# Patient Record
Sex: Male | Born: 1955 | Race: White | Hispanic: No | Marital: Single | Smoking: Current every day smoker
Health system: Southern US, Community
[De-identification: ages and names within clinical notes are randomized; demographics above are authoritative.]

## PROBLEM LIST (undated history)

## (undated) DIAGNOSIS — G2581 Restless legs syndrome: Secondary | ICD-10-CM

## (undated) DIAGNOSIS — F17200 Nicotine dependence, unspecified, uncomplicated: Secondary | ICD-10-CM

## (undated) DIAGNOSIS — C801 Malignant (primary) neoplasm, unspecified: Secondary | ICD-10-CM

## (undated) DIAGNOSIS — G8929 Other chronic pain: Secondary | ICD-10-CM

## (undated) DIAGNOSIS — K5790 Diverticulosis of intestine, part unspecified, without perforation or abscess without bleeding: Secondary | ICD-10-CM

## (undated) DIAGNOSIS — M5430 Sciatica, unspecified side: Secondary | ICD-10-CM

## (undated) DIAGNOSIS — K219 Gastro-esophageal reflux disease without esophagitis: Secondary | ICD-10-CM

## (undated) DIAGNOSIS — T7840XA Allergy, unspecified, initial encounter: Secondary | ICD-10-CM

## (undated) DIAGNOSIS — Z8719 Personal history of other diseases of the digestive system: Secondary | ICD-10-CM

## (undated) DIAGNOSIS — M549 Dorsalgia, unspecified: Secondary | ICD-10-CM

## (undated) DIAGNOSIS — M1990 Unspecified osteoarthritis, unspecified site: Secondary | ICD-10-CM

## (undated) HISTORY — DX: Gastro-esophageal reflux disease without esophagitis: K21.9

## (undated) HISTORY — DX: Personal history of other diseases of the digestive system: Z87.19

## (undated) HISTORY — DX: Allergy, unspecified, initial encounter: T78.40XA

## (undated) HISTORY — DX: Diverticulosis of intestine, part unspecified, without perforation or abscess without bleeding: K57.90

## (undated) HISTORY — PX: TONSILLECTOMY AND ADENOIDECTOMY: SUR1326

## (undated) HISTORY — PX: OTHER SURGICAL HISTORY: SHX169

## (undated) HISTORY — PX: JOINT REPLACEMENT: SHX530

---

## 2011-11-09 ENCOUNTER — Ambulatory Visit: Payer: Self-pay | Admitting: Family Medicine

## 2013-01-18 ENCOUNTER — Emergency Department: Payer: Self-pay | Admitting: Emergency Medicine

## 2013-01-27 ENCOUNTER — Emergency Department: Payer: Self-pay | Admitting: Emergency Medicine

## 2013-08-17 ENCOUNTER — Encounter: Payer: Self-pay | Admitting: Adult Health

## 2013-08-17 ENCOUNTER — Ambulatory Visit (INDEPENDENT_AMBULATORY_CARE_PROVIDER_SITE_OTHER): Payer: BC Managed Care – PPO | Admitting: Adult Health

## 2013-08-17 ENCOUNTER — Telehealth: Payer: Self-pay | Admitting: Emergency Medicine

## 2013-08-17 VITALS — BP 120/74 | HR 81 | Temp 97.7°F | Resp 12 | Ht 72.5 in | Wt 223.0 lb

## 2013-08-17 DIAGNOSIS — Z125 Encounter for screening for malignant neoplasm of prostate: Secondary | ICD-10-CM

## 2013-08-17 DIAGNOSIS — Z1211 Encounter for screening for malignant neoplasm of colon: Secondary | ICD-10-CM

## 2013-08-17 DIAGNOSIS — Z7189 Other specified counseling: Secondary | ICD-10-CM

## 2013-08-17 DIAGNOSIS — Z716 Tobacco abuse counseling: Secondary | ICD-10-CM

## 2013-08-17 DIAGNOSIS — F17200 Nicotine dependence, unspecified, uncomplicated: Secondary | ICD-10-CM

## 2013-08-17 DIAGNOSIS — Z0000 Encounter for general adult medical examination without abnormal findings: Secondary | ICD-10-CM

## 2013-08-17 DIAGNOSIS — K219 Gastro-esophageal reflux disease without esophagitis: Secondary | ICD-10-CM

## 2013-08-17 DIAGNOSIS — T7840XA Allergy, unspecified, initial encounter: Secondary | ICD-10-CM

## 2013-08-17 DIAGNOSIS — R229 Localized swelling, mass and lump, unspecified: Secondary | ICD-10-CM

## 2013-08-17 DIAGNOSIS — G479 Sleep disorder, unspecified: Secondary | ICD-10-CM | POA: Insufficient documentation

## 2013-08-17 DIAGNOSIS — B359 Dermatophytosis, unspecified: Secondary | ICD-10-CM

## 2013-08-17 DIAGNOSIS — D492 Neoplasm of unspecified behavior of bone, soft tissue, and skin: Secondary | ICD-10-CM

## 2013-08-17 LAB — COMPREHENSIVE METABOLIC PANEL
AST: 21 U/L (ref 0–37)
Alkaline Phosphatase: 70 U/L (ref 39–117)
BUN: 20 mg/dL (ref 6–23)
Calcium: 9.9 mg/dL (ref 8.4–10.5)
Chloride: 104 mEq/L (ref 96–112)
Creatinine, Ser: 1.2 mg/dL (ref 0.4–1.5)
Total Bilirubin: 0.9 mg/dL (ref 0.3–1.2)

## 2013-08-17 LAB — LIPID PANEL
Cholesterol: 191 mg/dL (ref 0–200)
HDL: 28.8 mg/dL — ABNORMAL LOW (ref >39.00)
Total CHOL/HDL Ratio: 7
Triglycerides: 409 mg/dL — ABNORMAL HIGH (ref 0.0–149.0)
VLDL: 81.8 mg/dL — ABNORMAL HIGH (ref 0.0–40.0)

## 2013-08-17 LAB — CBC WITH DIFFERENTIAL/PLATELET
Basophils Absolute: 0 10*3/uL (ref 0.0–0.1)
Eosinophils Absolute: 0.3 10*3/uL (ref 0.0–0.7)
Eosinophils Relative: 3.5 % (ref 0.0–5.0)
HCT: 44.3 % (ref 39.0–52.0)
Lymphocytes Relative: 24 % (ref 12.0–46.0)
Lymphs Abs: 1.8 10*3/uL (ref 0.7–4.0)
MCV: 89.1 fl (ref 78.0–100.0)
Monocytes Relative: 8.6 % (ref 3.0–12.0)
Platelets: 177 10*3/uL (ref 150.0–400.0)
RBC: 4.97 Mil/uL (ref 4.22–5.81)
RDW: 12.4 % (ref 11.5–14.6)
WBC: 7.6 10*3/uL (ref 4.5–10.5)

## 2013-08-17 LAB — LDL CHOLESTEROL, DIRECT: Direct LDL: 102.7 mg/dL

## 2013-08-17 LAB — TSH: TSH: 0.95 u[IU]/mL (ref 0.35–5.50)

## 2013-08-17 MED ORDER — TERBINAFINE HCL 1 % EX CREA: TOPICAL_CREAM | Freq: Two times a day (BID) | CUTANEOUS | Status: DC

## 2013-08-17 NOTE — Assessment & Plan Note (Signed)
Try OTC medication such as allegra, claritin or zyrtec

## 2013-08-17 NOTE — Assessment & Plan Note (Signed)
Apply lamisil to the affected area.

## 2013-08-17 NOTE — Assessment & Plan Note (Signed)
Discussed importance of smoking cessation. Patient wants to try to quit cold [place]. Offered assistance. Will continue to encourage.

## 2013-08-17 NOTE — Progress Notes (Signed)
Subjective:    Patient ID: Anthony Nguyen, male    DOB: 1956-07-06, 57 y.o.   MRN: [id number]  HPI  Pt is pleasant 57 yo male, presents to clinic to establish care.  Pt has never had a PCP in the past but has had several concerns recently that he has discussed with his pharmacist and was referred here by her.   1) Pt reports intermittent fatigue.  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  Pt states he is aware this is likely contributing to his fatigue but would like to have labs checked to make sure there is nothing else going on.    2) Pt reports environmental allergies that have become worse as he gets older. 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 Pt also reports difficulty when mowing as well. Pt states he has occasionally used a mask when mowing but usually forgets.  Pt has not tried any OTC medications for allergies.  3) Pt reports occasional acid reflux, especially after eating fried or fatty foods. Pt has tried Prilosec in the past but has not taken recently. Pt currently taking Tums when symptoms occur.  4) Pt would like to stop smoking. Pt states he quit cold [place] for a year and half but started back and now smokes more than he did before he quit. Pt states he is using electronic cigarettes occasionally but plans to attempt quitting cold [place] again soon.  Pt states he would like to try quitting on his own at this time.  5) Pt has approx 1 cm flesh-colored papule to right lower jaw.  Pt has been applying a "salve" that his pharmacist recommended with no improvement.  Pt states he had a similar area on his right cheek removed several years ago by a dermatologist and it tested negative for malignancies. He also has a circular area on his right leg.  Review of Systems  Constitutional: Positive for fatigue. Negative for fever, chills, activity change and unexpected weight  change.  HENT: Negative.   Eyes: Negative.   Respiratory: Negative.  Negative for cough, chest tightness, shortness of breath and wheezing.   Cardiovascular: Negative.  Negative for chest pain and leg swelling.  Gastrointestinal: Negative for nausea, vomiting, abdominal pain, diarrhea and constipation.       Occasional acid reflux  Endocrine: Negative.  Negative for cold intolerance, heat intolerance, polydipsia, polyphagia and polyuria.  Genitourinary: Negative.  Negative for difficulty urinating.  Musculoskeletal: Negative.   Skin:       ~1cm papule, right lower jaw  Allergic/Immunologic: Positive for environmental allergies.  Neurological: Negative.  Negative for dizziness, light-headedness and headaches.  Hematological: Negative.   Psychiatric/Behavioral: Positive for sleep disturbance.       Objective:   Physical Exam  Constitutional: He is oriented to person, place, and time. He appears well-developed and well-nourished. No distress.  HENT:  Head: Normocephalic and atraumatic.  Right Ear: External ear normal.  Left Ear: External ear normal.  Nose: Nose normal.  Mouth/Throat: Oropharynx is clear and moist.  Eyes: Conjunctivae and EOM are normal. Pupils are equal, round, and reactive to light.  Neck: Normal range of motion. Neck supple. No tracheal deviation present. No thyromegaly present.  Cardiovascular: Normal rate, regular rhythm, normal heart sounds and intact distal pulses.   Pulmonary/Chest: Effort normal and breath sounds normal. No respiratory distress.  He has no wheezes.  Abdominal: Soft. Bowel sounds are normal.  Musculoskeletal: Normal range of motion.  Lymphadenopathy:    He has no cervical adenopathy.  Neurological: He is alert and oriented to person, place, and time. He has normal reflexes. No cranial nerve deficit.  Skin: Skin is warm and dry. Lesion noted.     Psychiatric: He has a normal mood and affect. His behavior is normal. Judgment and thought  content normal.    BP 120/74  Pulse 81  Temp(Src) 97.7 F (36.5 C) (Oral)  Resp 12  Ht 6' 0.5" (1.842 m)  Wt 223 lb (101.152 kg)  BMI 29.81 kg/m2  SpO2 97%       Assessment & Plan:

## 2013-08-17 NOTE — Telephone Encounter (Signed)
Pt says he showed you ringworm on his leg, were you going to send something in for that?

## 2013-08-17 NOTE — Telephone Encounter (Signed)
I sent to the pharmacy but it is available OTC. Terbinafine topical apply 2 times a day for 1-3 weeks. If no improvement by the time he see the dermatologist he needs to show them.  He left without getting his paperwork. He needs to take a antihistamine such as claritin, allegra or zyrtec.  For his GERD, he needs to take Prilosec 20 mg OTC daily before breakfast. Tums as needed.

## 2013-08-17 NOTE — Telephone Encounter (Signed)
Pt notified and verbalized understanding.

## 2013-08-17 NOTE — Assessment & Plan Note (Signed)
Related to his work. He normally can sleep well but gets called multiple times during the night. 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 Discussed possibility of having some type of back up so that he could have some time off.

## 2013-08-17 NOTE — Telephone Encounter (Signed)
Patient wondering if we called his meds for R leg? Please advise

## 2013-08-17 NOTE — Assessment & Plan Note (Signed)
Omeprazole 20 mg daily before breakfast Tums as needed.

## 2013-08-17 NOTE — Patient Instructions (Signed)
   Thank you for choosing [facility] for your health care needs.  Please have your labs drawn prior to leaving the office.  The results will be available through MyChart for your convenience. Please remember to activate this. The activation code is located at the end of this form.  For your allergies please try one of the following: Claritin, Zyrtec or Allegra.  For your acid indigestion take Prilosec OTC 20 mg every morning. Take Tums following a meal that aggravates symptoms.

## 2013-08-21 ENCOUNTER — Encounter: Payer: Self-pay | Admitting: *Deleted

## 2013-08-24 ENCOUNTER — Telehealth: Payer: Self-pay | Admitting: Emergency Medicine

## 2013-09-01 ENCOUNTER — Ambulatory Visit: Payer: Self-pay | Admitting: Gastroenterology

## 2013-11-13 ENCOUNTER — Ambulatory Visit: Payer: Self-pay | Admitting: Gastroenterology

## 2013-11-24 ENCOUNTER — Encounter: Payer: Self-pay | Admitting: Adult Health

## 2013-11-24 ENCOUNTER — Ambulatory Visit (INDEPENDENT_AMBULATORY_CARE_PROVIDER_SITE_OTHER): Payer: BC Managed Care – PPO | Admitting: Adult Health

## 2013-11-24 ENCOUNTER — Telehealth: Payer: Self-pay | Admitting: Adult Health

## 2013-11-24 VITALS — BP 130/80 | HR 102 | Temp 97.9°F | Resp 14 | Ht 72.5 in | Wt 235.2 lb

## 2013-11-24 DIAGNOSIS — H8110 Benign paroxysmal vertigo, unspecified ear: Secondary | ICD-10-CM | POA: Insufficient documentation

## 2013-11-24 DIAGNOSIS — J3489 Other specified disorders of nose and nasal sinuses: Secondary | ICD-10-CM

## 2013-11-24 DIAGNOSIS — R0981 Nasal congestion: Secondary | ICD-10-CM

## 2013-11-24 MED ORDER — MECLIZINE HCL 25 MG PO TABS: 25.0000 mg | ORAL_TABLET | Freq: Three times a day (TID) | ORAL | Status: DC | PRN

## 2013-11-24 MED ORDER — MOMETASONE FUROATE 50 MCG/ACT NA SUSP: 2.0000 | Freq: Every day | NASAL | Status: DC

## 2013-11-24 NOTE — Patient Instructions (Signed)
  I am referring you to ENT to evaluate your chronic problems with stuffiness.  Use Nasonex 2 sprays into each nostril daily.  Use saline spray as often as you like to keep the sinus draining.  Try the nasal strips to open up your nostrils and help you breathe better.  For the dizziness try meclizine

## 2013-11-24 NOTE — Progress Notes (Signed)
   Subjective:    Patient ID: Anthony Nguyen, male    DOB: 05/13/1956, 58 y.o.   MRN: [id number]  HPI  Pt is a 58 y/o male who presents with feeling "swimmy headed". He reports symptoms began after having his colonoscopy at the end of January. He reports the room spinning with any rapid movement or changing position. He has not taken any OTC medications.  Also he reports ongoing, chronic nasal stuffiness/congestion. Has not been sick "just can't breath through my nose". Has used nasocort without relief.   Review of Systems  HENT: Positive for sinus pressure. Negative for postnasal drip and rhinorrhea.        Nasal congestion. Reports getting thick, long plugs out of his nose.  Respiratory: Negative.   Cardiovascular: Negative.   Neurological: Positive for dizziness. Negative for headaches.  All other systems reviewed and are negative.       Objective:   Physical Exam  Constitutional: He is oriented to person, place, and time. He appears well-developed and well-nourished. No distress.  HENT:  Nares completely clogged. Cannot breathe through his nose.  Eyes:  Nystagmus with rapid head movements  Cardiovascular: Regular rhythm and normal heart sounds.  Exam reveals no gallop and no friction rub.   No murmur heard. tachycardia  Pulmonary/Chest: Effort normal and breath sounds normal. No respiratory distress. He has no wheezes. He has no rales. He exhibits no tenderness.  Musculoskeletal: Normal range of motion.  Neurological: He is alert and oriented to person, place, and time.  Skin: Skin is warm and dry.  Psychiatric: He has a normal mood and affect. His behavior is normal. Judgment and thought content normal.          Assessment & Plan:   1. Chronic nasal congestion Start nasonex. Saline spray liberally. Try the nasal stips to see if opening nostrils will allow him to breathe through his nose easier. - Ambulatory referral to ENT  2. Benign paroxysmal positional  vertigo Meclizine x 1 week to see if this improves symptoms May need referral to physical therapy if not improvement

## 2013-11-24 NOTE — Telephone Encounter (Signed)
Patient Information:  Caller Name: [place]  Phone: [phone]  Patient: Anthony Nguyen, Anthony Nguyen  Gender: Male  DOB: 1956/06/14  Age: 58 Years  PCP: Charolette Forward  Office Follow Up:  Does the office need to follow up with this patient?: Yes  Instructions For The Office: Disposition:  Go to office now.  Appt not available. Please review, contact patient for direction for his care currently. Can reach patient at  [phone], please.   Symptoms  Reason For Call & Symptoms: Colonoscopy 11/13/2013.  Started with difficulty breathing thru nose the next day, is worse if up, moving about - feels like looses balance, feels things moving around and will break out in sweat.  Gets short of breath when walking.  Some headache at times.  If turning head left to right or down then up, does not feel right.   Current VS 133/73, p 92.  Reviewed Health History In EMR: Yes  Reviewed Medications In EMR: Yes  Reviewed Allergies In EMR: Yes  Reviewed Surgeries / Procedures: Yes  Date of Onset of Symptoms: 11/14/2013  Treatments Tried: Allergy medicine Nasocort  Treatments Tried Worked: No  Guideline(s) Used:  Dizziness  Disposition Per Guideline:   Go to Office Now  Reason For Disposition Reached:   Spinning or tilting sensation (vertigo) present now  Advice Given:  Some Causes of Temporary Dizziness:  Poor Fluid Intake - Not drinking enough fluids and being a little dehydrated is a common cause of temporary dizziness. This is always worse during hot weather.  Standing Up Suddenly - Standing up suddenly (especially getting out of bed) or prolonged standing in one place are common causes of temporary dizziness. Not drinking enough fluids always makes it worse. Certain medications can cause or increase this type of dizziness (e.g., blood pressure medications).  Drink Fluids:  Drink several glasses of fruit juice, other clear fluids, or water. This will improve hydration and blood glucose. If you have a fever  or have had heat exposure, make sure the fluids are cold.  Stand Up Slowly:  In the mornings, sit up for a few minutes before you stand up. That will help your blood flow make the adjustment.  Sit down or lie down if you feel dizzy.  Call Back If:  You become worse.  Patient Will Follow Care Advice:  YES

## 2013-11-24 NOTE — Progress Notes (Signed)
Pre-visit discussion using our clinic review tool. No additional management support is needed unless otherwise documented below in the visit note.  

## 2013-11-24 NOTE — Telephone Encounter (Signed)
Spoke with pt, appt today at 1:00 with Raquel.

## 2014-03-14 ENCOUNTER — Emergency Department: Payer: Self-pay

## 2014-03-14 LAB — CBC WITH DIFFERENTIAL/PLATELET
Basophil #: 0.1 10*3/uL (ref 0.0–0.1)
Basophil %: 0.6 %
Eosinophil #: 0.2 10*3/uL (ref 0.0–0.7)
Eosinophil %: 1.7 %
HCT: 44.2 % (ref 40.0–52.0)
HGB: 14.5 g/dL (ref 13.0–18.0)
Lymphocyte #: 1.6 10*3/uL (ref 1.0–3.6)
Lymphocyte %: 13.8 %
MCH: 29.9 pg (ref 26.0–34.0)
MCHC: 32.9 g/dL (ref 32.0–36.0)
MCV: 91 fL (ref 80–100)
MONO ABS: 0.8 x10 3/mm (ref 0.2–1.0)
Monocyte %: 6.9 %
NEUTROS ABS: 8.9 10*3/uL — AB (ref 1.4–6.5)
NEUTROS PCT: 77 %
Platelet: 155 10*3/uL (ref 150–440)
RBC: 4.86 10*6/uL (ref 4.40–5.90)
RDW: 13 % (ref 11.5–14.5)
WBC: 11.6 10*3/uL — ABNORMAL HIGH (ref 3.8–10.6)

## 2014-03-14 LAB — URINALYSIS, COMPLETE
BACTERIA: NONE SEEN
BILIRUBIN, UR: NEGATIVE
BLOOD: NEGATIVE
Glucose,UR: NEGATIVE mg/dL (ref 0–75)
KETONE: NEGATIVE
Leukocyte Esterase: NEGATIVE
Nitrite: NEGATIVE
Ph: 6 (ref 4.5–8.0)
Protein: NEGATIVE
SQUAMOUS EPITHELIAL: NONE SEEN
Specific Gravity: 1.018 (ref 1.003–1.030)
WBC UR: <1 /HPF (ref 0–5)

## 2014-03-14 LAB — COMPREHENSIVE METABOLIC PANEL
ALK PHOS: 81 U/L
Albumin: 3.9 g/dL (ref 3.4–5.0)
Anion Gap: 8 (ref 7–16)
BILIRUBIN TOTAL: 0.8 mg/dL (ref 0.2–1.0)
BUN: 16 mg/dL (ref 7–18)
Calcium, Total: 9.3 mg/dL (ref 8.5–10.1)
Chloride: 106 mmol/L (ref 98–107)
Co2: 24 mmol/L (ref 21–32)
Creatinine: 1.07 mg/dL (ref 0.60–1.30)
EGFR (African American): >60
EGFR (Non-African Amer.): >60
Glucose: 139 mg/dL — ABNORMAL HIGH (ref 65–99)
OSMOLALITY: 279 (ref 275–301)
POTASSIUM: 3.9 mmol/L (ref 3.5–5.1)
SGOT(AST): 21 U/L (ref 15–37)
SGPT (ALT): 35 U/L (ref 12–78)
Sodium: 138 mmol/L (ref 136–145)
Total Protein: 7.3 g/dL (ref 6.4–8.2)

## 2014-03-17 ENCOUNTER — Emergency Department (HOSPITAL_COMMUNITY): Payer: BC Managed Care – PPO

## 2014-03-17 ENCOUNTER — Encounter (HOSPITAL_COMMUNITY): Payer: Self-pay | Admitting: Emergency Medicine

## 2014-03-17 ENCOUNTER — Emergency Department (HOSPITAL_COMMUNITY)
Admission: EM | Admit: 2014-03-17 | Discharge: 2014-03-18 | Disposition: A | Discharge status: Home / Self Care | Payer: BC Managed Care – PPO | Attending: Emergency Medicine | Admitting: Emergency Medicine

## 2014-03-17 DIAGNOSIS — R1011 Right upper quadrant pain: Secondary | ICD-10-CM | POA: Insufficient documentation

## 2014-03-17 DIAGNOSIS — Z79899 Other long term (current) drug therapy: Secondary | ICD-10-CM | POA: Insufficient documentation

## 2014-03-17 DIAGNOSIS — Z8719 Personal history of other diseases of the digestive system: Secondary | ICD-10-CM | POA: Insufficient documentation

## 2014-03-17 DIAGNOSIS — R1012 Left upper quadrant pain: Secondary | ICD-10-CM | POA: Insufficient documentation

## 2014-03-17 DIAGNOSIS — IMO0002 Reserved for concepts with insufficient information to code with codable children: Secondary | ICD-10-CM | POA: Insufficient documentation

## 2014-03-17 DIAGNOSIS — F17200 Nicotine dependence, unspecified, uncomplicated: Secondary | ICD-10-CM | POA: Insufficient documentation

## 2014-03-17 DIAGNOSIS — R3911 Hesitancy of micturition: Secondary | ICD-10-CM

## 2014-03-17 DIAGNOSIS — R1013 Epigastric pain: Secondary | ICD-10-CM | POA: Insufficient documentation

## 2014-03-17 DIAGNOSIS — R109 Unspecified abdominal pain: Secondary | ICD-10-CM

## 2014-03-17 LAB — URINALYSIS, ROUTINE W REFLEX MICROSCOPIC
Bilirubin Urine: NEGATIVE
Glucose, UA: NEGATIVE mg/dL
Hgb urine dipstick: NEGATIVE
Ketones, ur: NEGATIVE mg/dL
Leukocytes, UA: NEGATIVE
NITRITE: NEGATIVE
PH: 7 (ref 5.0–8.0)
Protein, ur: NEGATIVE mg/dL
Specific Gravity, Urine: 1.022 (ref 1.005–1.030)
Urobilinogen, UA: 0.2 mg/dL (ref 0.0–1.0)

## 2014-03-17 LAB — CBC WITH DIFFERENTIAL/PLATELET
Basophils Absolute: 0 10*3/uL (ref 0.0–0.1)
Basophils Relative: 1 % (ref 0–1)
Eosinophils Absolute: 0.3 10*3/uL (ref 0.0–0.7)
Eosinophils Relative: 4 % (ref 0–5)
HCT: 41.7 % (ref 39.0–52.0)
HEMOGLOBIN: 14.1 g/dL (ref 13.0–17.0)
LYMPHS ABS: 2 10*3/uL (ref 0.7–4.0)
Lymphocytes Relative: 23 % (ref 12–46)
MCH: 31 pg (ref 26.0–34.0)
MCHC: 33.8 g/dL (ref 30.0–36.0)
MCV: 91.6 fL (ref 78.0–100.0)
MONOS PCT: 7 % (ref 3–12)
Monocytes Absolute: 0.6 10*3/uL (ref 0.1–1.0)
NEUTROS PCT: 67 % (ref 43–77)
Neutro Abs: 5.7 10*3/uL (ref 1.7–7.7)
PLATELETS: 178 10*3/uL (ref 150–400)
RBC: 4.55 MIL/uL (ref 4.22–5.81)
RDW: 12.5 % (ref 11.5–15.5)
WBC: 8.5 10*3/uL (ref 4.0–10.5)

## 2014-03-17 LAB — LIPASE, BLOOD: Lipase: 31 U/L (ref 11–59)

## 2014-03-17 LAB — COMPREHENSIVE METABOLIC PANEL
ALT: 48 U/L (ref 0–53)
AST: 41 U/L — ABNORMAL HIGH (ref 0–37)
Albumin: 3.8 g/dL (ref 3.5–5.2)
Alkaline Phosphatase: 64 U/L (ref 39–117)
BILIRUBIN TOTAL: 0.3 mg/dL (ref 0.3–1.2)
BUN: 20 mg/dL (ref 6–23)
CALCIUM: 9.3 mg/dL (ref 8.4–10.5)
CO2: 23 meq/L (ref 19–32)
CREATININE: 1.21 mg/dL (ref 0.50–1.35)
Chloride: 105 mEq/L (ref 96–112)
GFR, EST AFRICAN AMERICAN: 76 mL/min — AB (ref >90)
GFR, EST NON AFRICAN AMERICAN: 66 mL/min — AB (ref >90)
GLUCOSE: 98 mg/dL (ref 70–99)
Potassium: 4.1 mEq/L (ref 3.7–5.3)
Sodium: 140 mEq/L (ref 137–147)
Total Protein: 6.9 g/dL (ref 6.0–8.3)

## 2014-03-17 MED ORDER — MORPHINE SULFATE 4 MG/ML IJ SOLN
4.0000 mg | Freq: Once | INTRAMUSCULAR | Status: AC
  Administered 2014-03-17: 4 mg via INTRAVENOUS
  Filled 2014-03-17: qty 1

## 2014-03-17 MED ORDER — ONDANSETRON HCL 4 MG/2ML IJ SOLN
4.0000 mg | Freq: Once | INTRAMUSCULAR | Status: AC
  Administered 2014-03-17: 4 mg via INTRAVENOUS
  Filled 2014-03-17: qty 2

## 2014-03-17 NOTE — ED Provider Notes (Signed)
CSN: [id number]     Arrival date & time 03/17/14  2002 History   First MD Initiated Contact with Patient 03/17/14 2105     Chief Complaint  Patient presents with  . Back Pain     (Consider location/radiation/quality/duration/timing/severity/associated sxs/prior Treatment) The history is provided by the patient and medical records.    Patient presents with left flank pain, difficulty urinating that began 4 days ago.  States the difficulty urinating has been gradual in onset but 4 days ago it began trickling, was difficut to start the stream, and has to force himself to go, also has to stand up to urinate, which was no the case before.  He developed right sided abdominal and flank pain that was described as burning, this resolved and he now has identical pain on the left side.  He was seen at [facility] 3 days ago, had a CT abd/pelvis and was told he has "stool backed up," but that everything else was normal - was placed on mag citrate, cipro, flagyl, pain medication, and d/c home with GI follow up.  Has had normal bowel movements, daily BM at 5:30 every day that did not change until he was placed on stool softeners.  Has been sweating more than normal, has had some SOB with walking, nausea.  Denies fevers, CP, vomiting.  Does not have dysuria, frequency, or urgency.  Denies hx prostate problems or kidney stones.    Past Medical History  Diagnosis Date  . Allergy   . GERD (gastroesophageal reflux disease)   . Diverticulosis   . H/O diverticulitis of colon    Past Surgical History  Procedure Laterality Date  . Tonsillectomy and adenoidectomy     Family History  Problem Relation Age of Onset  . Hypertension Mother   . Alzheimer's disease Father   . Cancer Sister 25    breast cancer  . Diabetes Sister    History  Substance Use Topics  . Smoking status: Current Every Day Smoker -- 1.00 packs/day for 30 years    Types: Cigarettes  . Smokeless tobacco: Never Used  . Alcohol Use:  Yes    Review of Systems  All other systems reviewed and are negative.     Allergies  Review of patient's allergies indicates no known allergies.  Home Medications   Prior to Admission medications   Medication Sig Start Date End Date Taking? Authorizing Provider  ciprofloxacin (CIPRO) 500 MG tablet Take 500 mg by mouth 2 (two) times daily.   Yes Historical Provider, MD  HYDROcodone-acetaminophen (NORCO/VICODIN) 5-325 MG per tablet Take 1 tablet by mouth every 6 (six) hours as needed for moderate pain.   Yes Historical Provider, MD  magnesium citrate SOLN Take 1 Bottle by mouth once.   Yes Historical Provider, MD  metroNIDAZOLE (FLAGYL) 500 MG tablet Take 500 mg by mouth 3 (three) times daily.   Yes Historical Provider, MD  mometasone (NASONEX) 50 MCG/ACT nasal spray Place 1 spray into both nostrils daily as needed (congestion).   Yes Historical Provider, MD   BP 116/65  Pulse 79  Temp(Src) 98.2 F (36.8 C) (Oral)  Resp 18  SpO2 93% Physical Exam  Nursing note and vitals reviewed. Constitutional: He appears well-developed and well-nourished. No distress.  HENT:  Head: Normocephalic and atraumatic.  Neck: Neck supple.  Cardiovascular: Normal rate and regular rhythm.   Pulmonary/Chest: Effort normal and breath sounds normal. No respiratory distress. He has no wheezes. He has no rales.  Abdominal: Soft. He exhibits  no distension and no mass. There is tenderness in the right upper quadrant, epigastric area and left upper quadrant. There is no rebound and no guarding.  Genitourinary: Rectum normal.  Prostate soft, nontender.    Neurological: He is alert. He exhibits normal muscle tone.  Skin: He is not diaphoretic.    ED Course  Procedures (including critical care time) Labs Review Labs Reviewed  COMPREHENSIVE METABOLIC PANEL - Abnormal; Notable for the following:    AST 41 (*)    GFR calc non Af Amer 66 (*)    GFR calc Af Amer 76 (*)    All other components within normal  limits  URINALYSIS, ROUTINE W REFLEX MICROSCOPIC  CBC WITH DIFFERENTIAL  LIPASE, BLOOD    Imaging Review Dg Abd Acute W/chest  03/17/2014   CLINICAL DATA:  Pain with urination for 4 days.  EXAM: ACUTE ABDOMEN SERIES (ABDOMEN 2 VIEW & CHEST 1 VIEW)  COMPARISON:  Abdominal CT from 3 days prior  FINDINGS: Normal heart size and upper mediastinal contours. No consolidation, edema, effusion, or pneumothorax. Nodules noted on the prior abdominal CT are not visible due to small size.  No evidence of bowel obstruction or perforation. No abnormal intra-abdominal mass effect or calcification. No acute osseous findings. Chronic benign appearing sclerosis in the lower right pubic body.  IMPRESSION: Negative abdominal radiographs.  No acute cardiopulmonary disease.   Electronically Signed   By: Jorje Guild M.D.   On: 03/17/2014 22:36     EKG Interpretation None      9:52 PM I have asked the secretary to request the CT scan results from [facility].    12:14 AM No records received from [facility].  Per Network engineer, they did not find him in their system.  He has his discharge papers and prescriptions with him and I have given the secretary this paperwork to attempt to follow up.  I had a long discussion about the lab and urine results with the patient and his significant other.  Labs and UA unremarkable.  Suspect that patient has been treated for a colitis and I have advised him to follow up with GI as recommended [place].  Reason for urinary symptoms is unclear, suspect enlarged prostate.     12:42 AM Results received from [facility].  WBC 11.1 at last visit.  CT showed fecal retention and possible "fecalization" of terminal ileum.  Records added to patient's file.  Discussed all results with patient including pulmonary nodules that need follow up in 6-12 months.  Pt verbalizes understanding.    MDM   Final diagnoses:  Abdominal pain  Urinary hesitancy    Afebrile, nontoxic pt with bilateral  flank pain and urinary symptoms x 4 days.  Right sided pain resolved but left pain began the next day.  On cipro,flagyl,mag citrate x 3 days. Also taking norco for pain.  He is not in urinary retention - his urinary symptoms have been gradual but seem worse this week.  His UA is normal, doubt infection, doubt stone.  CT abd/pelvis reviewed that was done at outside hospital earlier this week.  I do not think patient would benefit from repeat CT today.  Xray unremarkable.  D/C home with percocet for pain, PCP and urology follow up.  Discussed result, findings, treatment, and follow up  with patient.  Pt given return precautions.  Pt verbalizes understanding and agrees with plan.        [place], PA-C 03/18/14 [id number]

## 2014-03-17 NOTE — ED Notes (Signed)
Pt presents with lower back/abd pain starting Tuesday. Pt states the pain starts in his RLQ and travels around to his lower back and back around to his LLQ. Pt also reports hard to start urinating, pt states he has to force his urine to come out. States his urine is slow stream then goes a steady regular stream but has increase pain with urination. Pt also states his symptoms started after having a colonoscopy in January. Pt denies blood in his stool or urine and no problems with BM. Pt denies hx of kidney stone

## 2014-03-17 NOTE — ED Notes (Signed)
Family at bedside. 

## 2014-03-18 MED ORDER — OXYCODONE-ACETAMINOPHEN 5-325 MG PO TABS: 1.0000 | ORAL_TABLET | ORAL | Status: DC | PRN

## 2014-03-18 NOTE — Discharge Instructions (Signed)
Read the information below.  Use the prescribed medication as directed.  Please discuss all new medications with your pharmacist.  Do not take additional tylenol while taking the prescribed pain medication to avoid overdose.  You may return to the Emergency Department at any time for worsening condition or any new symptoms that concern you.  If you develop high fevers, worsening abdominal pain, uncontrolled vomiting, or are unable to tolerate fluids by mouth, return to the ER for a recheck.      Abdominal Pain, Adult Many things can cause abdominal pain. Usually, abdominal pain is not caused by a disease and will improve without treatment. It can often be observed and treated at home. Your health care provider will do a physical exam and possibly order blood tests and X-rays to help determine the seriousness of your pain. However, in many cases, more time must pass before a clear cause of the pain can be found. Before that point, your health care provider may not know if you need more testing or further treatment. HOME CARE INSTRUCTIONS  Monitor your abdominal pain for any changes. The following actions may help to alleviate any discomfort you are experiencing:  Only take over-the-counter or prescription medicines as directed by your health care provider.  Do not take laxatives unless directed to do so by your health care provider.  Try a clear liquid diet (broth, tea, or water) as directed by your health care provider. Slowly move to a bland diet as tolerated. SEEK MEDICAL CARE IF:  You have unexplained abdominal pain.  You have abdominal pain associated with nausea or diarrhea.  You have pain when you urinate or have a bowel movement.  You experience abdominal pain that wakes you in the night.  You have abdominal pain that is worsened or improved by eating food.  You have abdominal pain that is worsened with eating fatty foods. SEEK IMMEDIATE MEDICAL CARE IF:   Your pain does not go  away within 2 hours.  You have a fever.  You keep throwing up (vomiting).  Your pain is felt only in portions of the abdomen, such as the right side or the left lower portion of the abdomen.  You pass bloody or black tarry stools. MAKE SURE YOU:  Understand these instructions.   Will watch your condition.   Will get help right away if you are not doing well or get worse.  Document Released: 07/15/2005 Document Revised: 07/26/2013 Document Reviewed: 06/14/2013 [facility] Patient Information 2014 [place].

## 2014-03-18 NOTE — ED Notes (Signed)
Pt came to the ED because he has been having lower back pain that radiates all around. No n/v/d. Currently no pain. No cardiac or respiratory distress. Will continue to monitor.

## 2014-03-18 NOTE — ED Notes (Signed)
Educated on [employer]

## 2014-03-19 NOTE — ED Provider Notes (Signed)
Medical screening examination/treatment/procedure(s) were performed by non-physician practitioner and as supervising physician I was immediately available for consultation/collaboration.   EKG Interpretation None        Fredia Sorrow, MD 03/19/14 1019

## 2014-03-28 ENCOUNTER — Telehealth: Payer: Self-pay | Admitting: *Deleted

## 2014-03-28 NOTE — Telephone Encounter (Signed)
He needs to be evaluated.

## 2014-03-28 NOTE — Telephone Encounter (Signed)
Pharmacy Note:  Pt requesting new rx  Oxycodone/apap 5-325   Ciprofloxacin Hcl 500 mg   Metronidazole 500 mg tab

## 2014-03-28 NOTE — Telephone Encounter (Signed)
Patient requesting Rx on 3 medications listed in pervious note. They were not prescribed by you. Patient has an appointment scheduled 04/02/14 related to WBC elevated and 3 "spots" on his lungs. Tried calling the patient to get more information, no answer. Please advise.

## 2014-03-30 NOTE — Telephone Encounter (Signed)
Patient scheduled 04/02/14 to see Raquel.

## 2014-04-02 ENCOUNTER — Encounter: Payer: Self-pay | Admitting: *Deleted

## 2014-04-02 ENCOUNTER — Encounter: Payer: Self-pay | Admitting: Adult Health

## 2014-04-02 ENCOUNTER — Ambulatory Visit (INDEPENDENT_AMBULATORY_CARE_PROVIDER_SITE_OTHER): Payer: BC Managed Care – PPO | Admitting: Adult Health

## 2014-04-02 VITALS — BP 118/64 | HR 82 | Temp 97.9°F | Resp 14 | Ht 72.5 in | Wt 227.8 lb

## 2014-04-02 DIAGNOSIS — R109 Unspecified abdominal pain: Secondary | ICD-10-CM

## 2014-04-02 DIAGNOSIS — M722 Plantar fascial fibromatosis: Secondary | ICD-10-CM

## 2014-04-02 DIAGNOSIS — R6889 Other general symptoms and signs: Secondary | ICD-10-CM

## 2014-04-02 DIAGNOSIS — Z23 Encounter for immunization: Secondary | ICD-10-CM

## 2014-04-02 DIAGNOSIS — R899 Unspecified abnormal finding in specimens from other organs, systems and tissues: Secondary | ICD-10-CM

## 2014-04-02 DIAGNOSIS — R3911 Hesitancy of micturition: Secondary | ICD-10-CM

## 2014-04-02 LAB — COMPREHENSIVE METABOLIC PANEL
ALBUMIN: 4.6 g/dL (ref 3.5–5.2)
ALK PHOS: 54 U/L (ref 39–117)
ALT: 32 U/L (ref 0–53)
AST: 24 U/L (ref 0–37)
BILIRUBIN TOTAL: 0.5 mg/dL (ref 0.2–1.2)
BUN: 15 mg/dL (ref 6–23)
CO2: 27 mEq/L (ref 19–32)
Calcium: 10.2 mg/dL (ref 8.4–10.5)
Chloride: 104 mEq/L (ref 96–112)
Creatinine, Ser: 1.2 mg/dL (ref 0.4–1.5)
GFR: 68.66 mL/min (ref >60.00)
Glucose, Bld: 120 mg/dL — ABNORMAL HIGH (ref 70–99)
Potassium: 4.1 mEq/L (ref 3.5–5.1)
SODIUM: 139 meq/L (ref 135–145)
Total Protein: 7.2 g/dL (ref 6.0–8.3)

## 2014-04-02 MED ORDER — TAMSULOSIN HCL 0.4 MG PO CAPS: 0.4000 mg | ORAL_CAPSULE | Freq: Every day | ORAL | Status: DC

## 2014-04-02 NOTE — Patient Instructions (Signed)
  I am requesting your records from [facility] back in December when they did a CT of the abdomen that showed pulmonary nodules.  I will contact you about a repeat CT scan once I look at the records.  For your plantar Fasciitis I am referring you to Dr. Elvina Mattes. They will contact you with an appointment.  Start Flomax 0.4 mg in the evening for urinary hesitancy. I am referring you to Urology.  Please have labs drawn today to follow up on abnormal labs you had at [facility] off on prescribing Chantix until labs return to normal.

## 2014-04-02 NOTE — Progress Notes (Signed)
Pre visit review using our clinic review tool, if applicable. No additional management support is needed unless otherwise documented below in the visit note. 

## 2014-04-02 NOTE — Progress Notes (Signed)
Patient ID: Anthony Nguyen, male   DOB: 04/28/1956, 58 y.o.   MRN: [id number]   Subjective:    Patient ID: Anthony Nguyen, male    DOB: 12/30/1955, 58 y.o.   MRN: [id number]  HPI  Pt is a pleasant 58 y/o male who presents to clinic s/p ED visit for abdominal pain and urinary hesitancy. He is s/p completing antibiotic tx. Labs: Lipase, CBC, urinalysis were all normal. Cmet showed minimally elevated AST and decrease GFR with normal creatinine. Pt reports that he is still experiencing urinary hesitancy. He denies fever or chills. Denies hematuria.  He reports that he had abdominal CT in December prior to having colonoscopy because he was c/o abdominal pain at that time as well. The CT showed pulmonary nodules. Recent films compared to previous CT which showed pulmonary nodules were very small to be detected. I am requesting CT report.  Pt reports that he is experiencing significant pain of both feet. Reports pain is worse when he first gets up in the morning. He has taken anti-inflammatory meds, he has used stretching bands and a frozen water bottle to roll his feet on them. Pain not improved.  He is still smoking. Reports that he would like to quit. Has been using nicoderm patch but these do not stay on because he perspires.   Past Medical History  Diagnosis Date  . Allergy   . GERD (gastroesophageal reflux disease)   . Diverticulosis   . H/O diverticulitis of colon     Current Outpatient Prescriptions on File Prior to Visit  Medication Sig Dispense Refill  . HYDROcodone-acetaminophen (NORCO/VICODIN) 5-325 MG per tablet Take 1 tablet by mouth every 6 (six) hours as needed for moderate pain.      . magnesium citrate SOLN Take 1 Bottle by mouth once.      . mometasone (NASONEX) 50 MCG/ACT nasal spray Place 1 spray into both nostrils daily as needed (congestion).      Marland Kitchen oxyCODONE-acetaminophen (PERCOCET/ROXICET) 5-325 MG per tablet Take 1-2 tablets by mouth every 4 (four) hours as needed  for moderate pain or severe pain.  15 tablet  0   No current facility-administered medications on file prior to visit.     Review of Systems  Constitutional: Negative.   HENT: Negative.   Eyes: Negative.   Respiratory: Negative.  Negative for shortness of breath.   Cardiovascular: Negative.  Negative for chest pain.  Gastrointestinal: Negative for nausea, vomiting, diarrhea, constipation and blood in stool.       Abdominal discomfort  Endocrine: Negative.   Genitourinary: Positive for difficulty urinating. Negative for dysuria, frequency, hematuria and flank pain.       Hesitancy  Musculoskeletal:       Bilateral feet pain  Skin: Negative.   Allergic/Immunologic: Negative.   Neurological: Negative.   Hematological: Negative.   Psychiatric/Behavioral: Negative.        Objective:  BP 118/64  Pulse 82  Temp(Src) 97.9 F (36.6 C) (Oral)  Resp 14  Ht 6' 0.5" (1.842 m)  Wt 227 lb 12 oz (103.307 kg)  BMI 30.45 kg/m2  SpO2 97%   Physical Exam  Constitutional: He is oriented to person, place, and time. He appears well-developed and well-nourished. No distress.  HENT:  Head: Normocephalic and atraumatic.  Eyes: Conjunctivae and EOM are normal.  Cardiovascular: Normal rate, regular rhythm, normal heart sounds and intact distal pulses.  Exam reveals no gallop and no friction rub.   No murmur heard. Pulmonary/Chest: Effort normal and breath  sounds normal. No respiratory distress. He has no wheezes. He has no rales.  Abdominal: Soft. Bowel sounds are normal. He exhibits no distension and no mass. There is no tenderness. There is no rebound and no guarding.  Musculoskeletal: Normal range of motion.  Neurological: He is alert and oriented to person, place, and time.  Skin: Skin is warm and dry.  Psychiatric: He has a normal mood and affect. His behavior is normal. Judgment and thought content normal.      Assessment & Plan:   1. Abdominal pain This is improving. Films showed  stool without obstruction. Recent labs were normal except slightly elevated AST. Repeat lab today.  2. Urinary hesitancy UA cone in ED was negative for blood, protein, glucose or uti. PSA was normal in 07/2013. Start flomax. Refer to urology.  - Ambulatory referral to Urology  3. Plantar fasciitis He has been stretching, applying ice and taking anti-inflammatory. Will refer to podiatry.   - Ambulatory referral to Podiatry  4. Abnormal laboratory test Recheck cmet to see if labs return to normal - Comprehensive metabolic panel  5. Need for Tdap vaccination Given in clinic today. - Tdap vaccine greater than or equal to 7yo IM

## 2014-04-13 ENCOUNTER — Other Ambulatory Visit: Payer: Self-pay | Admitting: Family

## 2014-04-13 ENCOUNTER — Telehealth: Payer: Self-pay | Admitting: Adult Health

## 2014-04-13 NOTE — Telephone Encounter (Signed)
Toxicology positive for cocaine. Called pt to discuss. He said "I don't know how that got there".

## 2014-04-24 ENCOUNTER — Encounter: Payer: Self-pay | Admitting: Adult Health

## 2014-08-14 ENCOUNTER — Ambulatory Visit: Payer: Self-pay | Admitting: Physical Medicine and Rehabilitation

## 2014-11-07 ENCOUNTER — Emergency Department: Payer: Self-pay | Admitting: Emergency Medicine

## 2014-11-07 LAB — COMPREHENSIVE METABOLIC PANEL
ALK PHOS: 88 U/L
Albumin: 4.2 g/dL (ref 3.4–5.0)
Anion Gap: 6 — ABNORMAL LOW (ref 7–16)
BILIRUBIN TOTAL: 0.2 mg/dL (ref 0.2–1.0)
BUN: 18 mg/dL (ref 7–18)
CO2: 28 mmol/L (ref 21–32)
CREATININE: 1.46 mg/dL — AB (ref 0.60–1.30)
Calcium, Total: 9.2 mg/dL (ref 8.5–10.1)
Chloride: 106 mmol/L (ref 98–107)
EGFR (African American): >60
GFR CALC NON AF AMER: 53 — AB
Glucose: 113 mg/dL — ABNORMAL HIGH (ref 65–99)
Osmolality: 282 (ref 275–301)
Potassium: 3.9 mmol/L (ref 3.5–5.1)
SGOT(AST): 48 U/L — ABNORMAL HIGH (ref 15–37)
SGPT (ALT): 50 U/L
Sodium: 140 mmol/L (ref 136–145)
TOTAL PROTEIN: 7.9 g/dL (ref 6.4–8.2)

## 2014-11-07 LAB — CBC
HCT: 44.6 % (ref 40.0–52.0)
HGB: 14.7 g/dL (ref 13.0–18.0)
MCH: 29.7 pg (ref 26.0–34.0)
MCHC: 32.9 g/dL (ref 32.0–36.0)
MCV: 90 fL (ref 80–100)
Platelet: 201 10*3/uL (ref 150–440)
RBC: 4.95 10*6/uL (ref 4.40–5.90)
RDW: 12.5 % (ref 11.5–14.5)
WBC: 13.6 10*3/uL — ABNORMAL HIGH (ref 3.8–10.6)

## 2014-11-12 ENCOUNTER — Encounter (HOSPITAL_COMMUNITY): Payer: Self-pay | Admitting: Emergency Medicine

## 2014-11-12 ENCOUNTER — Emergency Department (HOSPITAL_COMMUNITY)
Admission: EM | Admit: 2014-11-12 | Discharge: 2014-11-12 | Disposition: A | Discharge status: Home / Self Care | Payer: BLUE CROSS/BLUE SHIELD | Attending: Emergency Medicine | Admitting: Emergency Medicine

## 2014-11-12 ENCOUNTER — Emergency Department (HOSPITAL_COMMUNITY): Payer: BLUE CROSS/BLUE SHIELD

## 2014-11-12 DIAGNOSIS — R0781 Pleurodynia: Secondary | ICD-10-CM | POA: Diagnosis not present

## 2014-11-12 DIAGNOSIS — Z8719 Personal history of other diseases of the digestive system: Secondary | ICD-10-CM | POA: Insufficient documentation

## 2014-11-12 DIAGNOSIS — Z720 Tobacco use: Secondary | ICD-10-CM | POA: Diagnosis not present

## 2014-11-12 DIAGNOSIS — Z7951 Long term (current) use of inhaled steroids: Secondary | ICD-10-CM | POA: Insufficient documentation

## 2014-11-12 DIAGNOSIS — G8911 Acute pain due to trauma: Secondary | ICD-10-CM | POA: Insufficient documentation

## 2014-11-12 DIAGNOSIS — Z79899 Other long term (current) drug therapy: Secondary | ICD-10-CM | POA: Diagnosis not present

## 2014-11-12 DIAGNOSIS — J069 Acute upper respiratory infection, unspecified: Secondary | ICD-10-CM | POA: Insufficient documentation

## 2014-11-12 MED ORDER — PREDNISONE 50 MG PO TABS: 50.0000 mg | ORAL_TABLET | Freq: Every day | ORAL | Status: DC

## 2014-11-12 MED ORDER — DOXYCYCLINE HYCLATE 100 MG PO CAPS: 100.0000 mg | ORAL_CAPSULE | Freq: Two times a day (BID) | ORAL | Status: DC

## 2014-11-12 MED ORDER — OXYCODONE-ACETAMINOPHEN 5-325 MG PO TABS: 1.0000 | ORAL_TABLET | Freq: Four times a day (QID) | ORAL | Status: DC | PRN

## 2014-11-12 MED ORDER — HYDROMORPHONE HCL 1 MG/ML IJ SOLN
1.0000 mg | Freq: Once | INTRAMUSCULAR | Status: AC
  Administered 2014-11-12: 1 mg via INTRAMUSCULAR
  Filled 2014-11-12: qty 1

## 2014-11-12 NOTE — ED Notes (Signed)
Pt instructed on use of Incentive spirometer.
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
## 2014-11-12 NOTE — Discharge Instructions (Signed)
Return here as needed. Follow up with your neurosurgeon. Use ice and heat on your back and ribs

## 2014-11-15 ENCOUNTER — Emergency Department: Payer: Self-pay | Admitting: Emergency Medicine

## 2014-11-16 ENCOUNTER — Emergency Department (HOSPITAL_COMMUNITY): Payer: BLUE CROSS/BLUE SHIELD

## 2014-11-16 ENCOUNTER — Emergency Department (HOSPITAL_COMMUNITY)
Admission: EM | Admit: 2014-11-16 | Discharge: 2014-11-16 | Disposition: A | Discharge status: Home / Self Care | Payer: BLUE CROSS/BLUE SHIELD | Attending: Emergency Medicine | Admitting: Emergency Medicine

## 2014-11-16 ENCOUNTER — Encounter (HOSPITAL_COMMUNITY): Payer: Self-pay | Admitting: *Deleted

## 2014-11-16 DIAGNOSIS — Z8719 Personal history of other diseases of the digestive system: Secondary | ICD-10-CM | POA: Insufficient documentation

## 2014-11-16 DIAGNOSIS — R079 Chest pain, unspecified: Secondary | ICD-10-CM

## 2014-11-16 DIAGNOSIS — Z79899 Other long term (current) drug therapy: Secondary | ICD-10-CM | POA: Insufficient documentation

## 2014-11-16 DIAGNOSIS — R0789 Other chest pain: Secondary | ICD-10-CM | POA: Diagnosis not present

## 2014-11-16 DIAGNOSIS — Z720 Tobacco use: Secondary | ICD-10-CM | POA: Diagnosis not present

## 2014-11-16 DIAGNOSIS — Z7951 Long term (current) use of inhaled steroids: Secondary | ICD-10-CM | POA: Diagnosis not present

## 2014-11-16 LAB — COMPREHENSIVE METABOLIC PANEL
ALBUMIN: 3.9 g/dL (ref 3.5–5.2)
ALT: 30 U/L (ref 0–53)
AST: 20 U/L (ref 0–37)
Alkaline Phosphatase: 74 U/L (ref 39–117)
Anion gap: 7 (ref 5–15)
BUN: 23 mg/dL (ref 6–23)
CALCIUM: 9.2 mg/dL (ref 8.4–10.5)
CHLORIDE: 105 mmol/L (ref 96–112)
CO2: 28 mmol/L (ref 19–32)
Creatinine, Ser: 1.18 mg/dL (ref 0.50–1.35)
GFR calc Af Amer: 78 mL/min — ABNORMAL LOW (ref >90)
GFR calc non Af Amer: 67 mL/min — ABNORMAL LOW (ref >90)
Glucose, Bld: 119 mg/dL — ABNORMAL HIGH (ref 70–99)
Potassium: 3.9 mmol/L (ref 3.5–5.1)
Sodium: 140 mmol/L (ref 135–145)
Total Bilirubin: 0.4 mg/dL (ref 0.3–1.2)
Total Protein: 6.5 g/dL (ref 6.0–8.3)

## 2014-11-16 LAB — TROPONIN I: Troponin I: <0.03 ng/mL (ref <0.031)

## 2014-11-16 LAB — CBC WITH DIFFERENTIAL/PLATELET
BASOS ABS: 0 10*3/uL (ref 0.0–0.1)
Basophils Relative: 0 % (ref 0–1)
EOS ABS: 0.1 10*3/uL (ref 0.0–0.7)
Eosinophils Relative: 1 % (ref 0–5)
HCT: 44.2 % (ref 39.0–52.0)
HEMOGLOBIN: 14.3 g/dL (ref 13.0–17.0)
Lymphocytes Relative: 23 % (ref 12–46)
Lymphs Abs: 3.2 10*3/uL (ref 0.7–4.0)
MCH: 29.5 pg (ref 26.0–34.0)
MCHC: 32.4 g/dL (ref 30.0–36.0)
MCV: 91.3 fL (ref 78.0–100.0)
Monocytes Absolute: 1.2 10*3/uL — ABNORMAL HIGH (ref 0.1–1.0)
Monocytes Relative: 9 % (ref 3–12)
Neutro Abs: 9.2 10*3/uL — ABNORMAL HIGH (ref 1.7–7.7)
Neutrophils Relative %: 67 % (ref 43–77)
Platelets: 219 10*3/uL (ref 150–400)
RBC: 4.84 MIL/uL (ref 4.22–5.81)
RDW: 12.8 % (ref 11.5–15.5)
WBC: 13.7 10*3/uL — ABNORMAL HIGH (ref 4.0–10.5)

## 2014-11-16 MED ORDER — HYDROCODONE-ACETAMINOPHEN 5-325 MG PO TABS: 2.0000 | ORAL_TABLET | ORAL | Status: DC | PRN

## 2014-11-16 MED ORDER — SODIUM CHLORIDE 0.9 % IV SOLN
Freq: Once | INTRAVENOUS | Status: AC
Start: 2014-11-16 — End: 2014-11-16
  Administered 2014-11-16: 125 mL/h via INTRAVENOUS

## 2014-11-16 MED ORDER — ALBUTEROL SULFATE HFA 108 (90 BASE) MCG/ACT IN AERS: 2.0000 | INHALATION_SPRAY | RESPIRATORY_TRACT | Status: DC | PRN

## 2014-11-16 MED ORDER — IBUPROFEN 800 MG PO TABS: 800.0000 mg | ORAL_TABLET | Freq: Three times a day (TID) | ORAL | Status: DC

## 2014-11-16 MED ORDER — ONDANSETRON HCL 4 MG/2ML IJ SOLN
4.0000 mg | Freq: Once | INTRAMUSCULAR | Status: AC
  Administered 2014-11-16: 4 mg via INTRAVENOUS
  Filled 2014-11-16: qty 2

## 2014-11-16 MED ORDER — HYDROMORPHONE HCL 1 MG/ML IJ SOLN
1.0000 mg | Freq: Once | INTRAMUSCULAR | Status: AC
  Administered 2014-11-16: 1 mg via INTRAVENOUS
  Filled 2014-11-16: qty 1
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
## 2014-11-16 NOTE — ED Notes (Signed)
[place], MD at bedside 

## 2014-11-16 NOTE — Discharge Instructions (Signed)

## 2015-01-30 ENCOUNTER — Ambulatory Visit
Admit: 2015-01-30 | Disposition: A | Discharge status: Home / Self Care | Payer: Self-pay | Attending: Physical Medicine and Rehabilitation | Admitting: Physical Medicine and Rehabilitation

## 2015-12-16 ENCOUNTER — Emergency Department
Admission: EM | Admit: 2015-12-16 | Discharge: 2015-12-16 | Disposition: A | Discharge status: Home / Self Care | Payer: BLUE CROSS/BLUE SHIELD | Attending: Emergency Medicine | Admitting: Emergency Medicine

## 2015-12-16 ENCOUNTER — Emergency Department: Payer: BLUE CROSS/BLUE SHIELD

## 2015-12-16 ENCOUNTER — Encounter: Payer: Self-pay | Admitting: Emergency Medicine

## 2015-12-16 DIAGNOSIS — F17210 Nicotine dependence, cigarettes, uncomplicated: Secondary | ICD-10-CM | POA: Diagnosis not present

## 2015-12-16 DIAGNOSIS — R000 Tachycardia, unspecified: Secondary | ICD-10-CM | POA: Diagnosis not present

## 2015-12-16 DIAGNOSIS — R519 Headache, unspecified: Secondary | ICD-10-CM

## 2015-12-16 DIAGNOSIS — J111 Influenza due to unidentified influenza virus with other respiratory manifestations: Secondary | ICD-10-CM | POA: Diagnosis not present

## 2015-12-16 DIAGNOSIS — R51 Headache: Secondary | ICD-10-CM

## 2015-12-16 LAB — COMPREHENSIVE METABOLIC PANEL
ALBUMIN: 4.7 g/dL (ref 3.5–5.0)
ALK PHOS: 73 U/L (ref 38–126)
ALT: 22 U/L (ref 17–63)
ANION GAP: 11 (ref 5–15)
AST: 22 U/L (ref 15–41)
BILIRUBIN TOTAL: 0.6 mg/dL (ref 0.3–1.2)
BUN: 14 mg/dL (ref 6–20)
CALCIUM: 9.3 mg/dL (ref 8.9–10.3)
CO2: 24 mmol/L (ref 22–32)
Chloride: 102 mmol/L (ref 101–111)
Creatinine, Ser: 1.13 mg/dL (ref 0.61–1.24)
GFR calc Af Amer: >60 mL/min (ref >60)
GFR calc non Af Amer: >60 mL/min (ref >60)
GLUCOSE: 104 mg/dL — AB (ref 65–99)
POTASSIUM: 3.7 mmol/L (ref 3.5–5.1)
SODIUM: 137 mmol/L (ref 135–145)
TOTAL PROTEIN: 7.6 g/dL (ref 6.5–8.1)

## 2015-12-16 LAB — CBC
HEMATOCRIT: 42 % (ref 40.0–52.0)
HEMOGLOBIN: 14.3 g/dL (ref 13.0–18.0)
MCH: 29.7 pg (ref 26.0–34.0)
MCHC: 34.1 g/dL (ref 32.0–36.0)
MCV: 87.1 fL (ref 80.0–100.0)
Platelets: 136 10*3/uL — ABNORMAL LOW (ref 150–440)
RBC: 4.83 MIL/uL (ref 4.40–5.90)
RDW: 13.1 % (ref 11.5–14.5)
WBC: 8.3 10*3/uL (ref 3.8–10.6)

## 2015-12-16 LAB — POCT RAPID STREP A: Streptococcus, Group A Screen (Direct): NEGATIVE

## 2015-12-16 MED ORDER — OSELTAMIVIR PHOSPHATE 75 MG PO CAPS: 75.0000 mg | ORAL_CAPSULE | Freq: Two times a day (BID) | ORAL | Status: DC

## 2015-12-16 MED ORDER — METOCLOPRAMIDE HCL 5 MG/ML IJ SOLN
10.0000 mg | Freq: Once | INTRAMUSCULAR | Status: AC
  Administered 2015-12-16: 10 mg via INTRAVENOUS
  Filled 2015-12-16: qty 2

## 2015-12-16 MED ORDER — KETOROLAC TROMETHAMINE 30 MG/ML IJ SOLN
30.0000 mg | Freq: Once | INTRAMUSCULAR | Status: AC
  Administered 2015-12-16: 30 mg via INTRAVENOUS
  Filled 2015-12-16: qty 1

## 2015-12-16 MED ORDER — HYDROCOD POLST-CPM POLST ER 10-8 MG/5ML PO SUER: 5.0000 mL | Freq: Two times a day (BID) | ORAL | Status: DC

## 2015-12-16 MED ORDER — IBUPROFEN 800 MG PO TABS: 800.0000 mg | ORAL_TABLET | Freq: Three times a day (TID) | ORAL | Status: DC | PRN

## 2015-12-16 MED ORDER — HYDROMORPHONE HCL 1 MG/ML IJ SOLN
1.0000 mg | Freq: Once | INTRAMUSCULAR | Status: AC
  Administered 2015-12-16: 1 mg via INTRAVENOUS
  Filled 2015-12-16: qty 1

## 2015-12-16 MED ORDER — SODIUM CHLORIDE 0.9 % IV SOLN
Freq: Once | INTRAVENOUS | Status: AC
  Administered 2015-12-16: 18:00:00 via INTRAVENOUS

## 2015-12-16 NOTE — Discharge Instructions (Signed)

## 2015-12-16 NOTE — ED Notes (Signed)
Patient presents to the ED with cough, congestion, headache, and fever.  Patient took tylenol sinus about 45 minutes prior to arrival.  Patient states his wife had pneumonia about 2-3 weeks ago.  Patient reports shortness of breath.  Patient reports having a "stomach flu" about 2-3 weeks ago.  Patient's breath sounds are diminished.

## 2015-12-16 NOTE — ED Provider Notes (Signed)
[facility] Emergency Department Provider Note     Time seen: ----------------------------------------- 6:33 PM on 12/16/2015 -----------------------------------------    I have reviewed the triage vital signs and the nursing notes.   HISTORY  Chief Complaint Cough; Fever; and Headache    HPI Anthony Nguyen is a 60 y.o. male who presents ER with fevers, chills, cough, congestion, sore throat and headache that started last night. Patient states he has eaten anything all day, states his wife was diagnosed with pneumonia to 3 weeks ago. Most of his complaint is a headache behind his eyes bilaterally. Nothing makes symptoms better or worse.   Past Medical History  Diagnosis Date  . Allergy   . GERD (gastroesophageal reflux disease)   . Diverticulosis   . H/O diverticulitis of colon     Patient Active Problem List   Diagnosis Date Noted  . Chronic nasal congestion 11/24/2013  . Benign paroxysmal positional vertigo 11/24/2013  . Sleep disturbance 08/17/2013  . Allergy 08/17/2013  . GERD (gastroesophageal reflux disease) 08/17/2013  . Tobacco abuse counseling 08/17/2013  . Ringworm 08/17/2013    Past Surgical History  Procedure Laterality Date  . Tonsillectomy and adenoidectomy      Allergies Review of patient's allergies indicates no known allergies.  Social History Social History  Substance Use Topics  . Smoking status: Current Every Day Smoker -- 1.00 packs/day for 30 years    Types: Cigarettes  . Smokeless tobacco: Never Used  . Alcohol Use: Yes    Review of Systems Constitutional: Positive for fevers, chills, body aches Eyes: Negative for visual changes. ENT: Positive for sore throat Cardiovascular: Negative for chest pain. Respiratory: Negative for shortness of breath. Positive for cough Gastrointestinal: Negative for abdominal pain, vomiting and diarrhea. Genitourinary: Negative for dysuria. Musculoskeletal: Negative for  back pain. Skin: Negative for rash. Neurological: Positive for headache  10-point ROS otherwise negative.  ____________________________________________   PHYSICAL EXAM:  VITAL SIGNS: ED Triage Vitals  Enc Vitals Group     BP 12/16/15 1646 140/62 mmHg     Pulse Rate 12/16/15 1646 112     Resp 12/16/15 1646 18     Temp 12/16/15 1646 99.2 F (37.3 C)     Temp Source 12/16/15 1646 Oral     SpO2 12/16/15 1646 95 %     Weight 12/16/15 1646 239 lb (108.41 kg)     Height 12/16/15 1646 6\' 1"  (1.854 m)     Head Cir --      Peak Flow --      Pain Score 12/16/15 1647 10     Pain Loc --      Pain Edu? --      Excl. in [place]? --     Constitutional: Alert and oriented. Well appearing and in no distress. Eyes: Conjunctivae are normal. PERRL. Normal extraocular movements. ENT   Head: Normocephalic and atraumatic.   Nose: No congestion/rhinnorhea.   Mouth/Throat: Mucous membranes are moist.   Neck: No stridor. Cardiovascular: Rapid rate, regular rhythm. Normal and symmetric distal pulses are present in all extremities. No murmurs, rubs, or gallops. Respiratory: Normal respiratory effort without tachypnea nor retractions. Breath sounds are clear and equal bilaterally. No wheezes/rales/rhonchi. Gastrointestinal: Soft and nontender. No distention. No abdominal bruits.  Musculoskeletal: Nontender with normal range of motion in all extremities. No joint effusions.  No lower extremity tenderness nor edema. Neurologic:  Normal speech and language. No gross focal neurologic deficits are appreciated. Speech is normal. No gait instability. Skin:  Skin is warm, dry and intact. No rash noted. Psychiatric: Mood and affect are normal. Speech and behavior are normal. Patient exhibits appropriate insight and judgment. ____________________________________________  EKG: Interpreted by me. normal sinus rhythm with a rate of 98 bpm, normal. We'll, normal QRS, normal QT interval. Normal  axis.  ____________________________________________  ED COURSE:  Pertinent labs & imaging results that were available during my care of the patient were reviewed by me and considered in my medical decision making (see chart for details).  Patient is no acute distress, likely influenza. We will give IV headache medicines, fluid and reevaluate. ____________________________________________    LABS (pertinent positives/negatives)  Labs Reviewed  COMPREHENSIVE METABOLIC PANEL - Abnormal; Notable for the following:    Glucose, Bld 104 (*)    All other components within normal limits  CBC - Abnormal; Notable for the following:    Platelets 136 (*)    All other components within normal limits  CULTURE, BLOOD (ROUTINE X 2)  CULTURE, BLOOD (ROUTINE X 2)  URINALYSIS COMPLETEWITH MICROSCOPIC ([facility] ONLY)  POCT RAPID STREP A    RADIOLOGY   chest x-ray is unremarkable   ____________________________________________  FINAL ASSESSMENT AND PLAN  Influenza  Plan: Patient with labs and imaging as dictated above. Patient is in no acute distress. Clinically with influenza. Will be treated with Tamiflu and Tussionex. He is stable for outpatient follow up.   Earleen Newport, MD   Earleen Newport, MD 12/16/15 [phone]

## 2015-12-21 LAB — CULTURE, BLOOD (ROUTINE X 2)
Culture: NO GROWTH
Culture: NO GROWTH

## 2016-02-29 ENCOUNTER — Emergency Department
Admission: EM | Admit: 2016-02-29 | Discharge: 2016-02-29 | Disposition: A | Discharge status: Home / Self Care | Payer: BLUE CROSS/BLUE SHIELD | Attending: Emergency Medicine | Admitting: Emergency Medicine

## 2016-02-29 DIAGNOSIS — M545 Low back pain, unspecified: Secondary | ICD-10-CM

## 2016-02-29 DIAGNOSIS — F17210 Nicotine dependence, cigarettes, uncomplicated: Secondary | ICD-10-CM | POA: Diagnosis not present

## 2016-02-29 LAB — URINALYSIS COMPLETE WITH MICROSCOPIC (ARMC ONLY)
BILIRUBIN URINE: NEGATIVE
Bacteria, UA: NONE SEEN
Glucose, UA: NEGATIVE mg/dL
Hgb urine dipstick: NEGATIVE
KETONES UR: NEGATIVE mg/dL
Leukocytes, UA: NEGATIVE
Nitrite: NEGATIVE
Protein, ur: NEGATIVE mg/dL
Specific Gravity, Urine: 1.026 (ref 1.005–1.030)
Squamous Epithelial / LPF: NONE SEEN
pH: 7 (ref 5.0–8.0)

## 2016-02-29 MED ORDER — OXYCODONE-ACETAMINOPHEN 5-325 MG PO TABS
2.0000 | ORAL_TABLET | Freq: Once | ORAL | Status: AC
  Administered 2016-02-29: 2 via ORAL
  Filled 2016-02-29: qty 2
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
## 2016-02-29 NOTE — ED Notes (Signed)

## 2016-02-29 NOTE — Discharge Instructions (Signed)

## 2016-02-29 NOTE — ED Notes (Signed)
Pt reports he had shots in his back for sciatica on 02/21/16. Pt reports about two days after having the shots he developed worse pain and burning around his midsection. Pt went to see the doctor that did the shots and was told that he just need to give the shots a few days to work. Pt reports increasing pain and burning sensation since. Pt also reports since Tuesday having a difficult time urinating.Pt also reports he has a rash on his chest for about 2 to 3 weeks.

## 2016-06-10 ENCOUNTER — Other Ambulatory Visit: Payer: Self-pay | Admitting: Orthopedic Surgery

## 2016-06-10 DIAGNOSIS — M25562 Pain in left knee: Secondary | ICD-10-CM

## 2016-06-10 DIAGNOSIS — M1712 Unilateral primary osteoarthritis, left knee: Secondary | ICD-10-CM

## 2016-06-26 ENCOUNTER — Ambulatory Visit: Payer: BLUE CROSS/BLUE SHIELD

## 2017-02-15 ENCOUNTER — Other Ambulatory Visit: Payer: Self-pay | Admitting: Physical Medicine and Rehabilitation

## 2017-02-15 DIAGNOSIS — M5416 Radiculopathy, lumbar region: Secondary | ICD-10-CM

## 2017-02-26 ENCOUNTER — Ambulatory Visit
Admission: RE | Admit: 2017-02-26 | Discharge: 2017-02-26 | Disposition: A | Discharge status: Home / Self Care | Payer: BLUE CROSS/BLUE SHIELD | Source: Ambulatory Visit | Attending: Physical Medicine and Rehabilitation | Admitting: Physical Medicine and Rehabilitation

## 2017-02-26 DIAGNOSIS — M5126 Other intervertebral disc displacement, lumbar region: Secondary | ICD-10-CM | POA: Diagnosis not present

## 2017-02-26 DIAGNOSIS — M5416 Radiculopathy, lumbar region: Secondary | ICD-10-CM

## 2017-09-15 ENCOUNTER — Other Ambulatory Visit: Payer: Self-pay | Admitting: Orthopedic Surgery

## 2017-09-15 DIAGNOSIS — M1712 Unilateral primary osteoarthritis, left knee: Secondary | ICD-10-CM

## 2017-09-16 ENCOUNTER — Ambulatory Visit
Admission: RE | Admit: 2017-09-16 | Discharge: 2017-09-16 | Disposition: A | Discharge status: Home / Self Care | Payer: BLUE CROSS/BLUE SHIELD | Source: Ambulatory Visit | Attending: Orthopedic Surgery | Admitting: Orthopedic Surgery

## 2017-09-16 DIAGNOSIS — M1712 Unilateral primary osteoarthritis, left knee: Secondary | ICD-10-CM | POA: Diagnosis not present

## 2017-09-16 DIAGNOSIS — M7122 Synovial cyst of popliteal space [Baker], left knee: Secondary | ICD-10-CM | POA: Diagnosis not present

## 2017-09-16 DIAGNOSIS — M1612 Unilateral primary osteoarthritis, left hip: Secondary | ICD-10-CM | POA: Diagnosis not present

## 2017-09-29 ENCOUNTER — Encounter
Admission: RE | Admit: 2017-09-29 | Discharge: 2017-09-29 | Disposition: A | Discharge status: Home / Self Care | Payer: BLUE CROSS/BLUE SHIELD | Source: Ambulatory Visit | Attending: Orthopedic Surgery | Admitting: Orthopedic Surgery

## 2017-09-29 ENCOUNTER — Other Ambulatory Visit: Payer: Self-pay

## 2017-09-29 DIAGNOSIS — Z01818 Encounter for other preprocedural examination: Secondary | ICD-10-CM | POA: Insufficient documentation

## 2017-09-29 DIAGNOSIS — M1712 Unilateral primary osteoarthritis, left knee: Secondary | ICD-10-CM | POA: Diagnosis not present

## 2017-09-29 HISTORY — DX: Nicotine dependence, unspecified, uncomplicated: F17.200

## 2017-09-29 HISTORY — DX: Restless legs syndrome: G25.81

## 2017-09-29 HISTORY — DX: Unspecified osteoarthritis, unspecified site: M19.90

## 2017-09-29 HISTORY — DX: Malignant (primary) neoplasm, unspecified: C80.1

## 2017-09-29 HISTORY — DX: Sciatica, unspecified side: M54.30

## 2017-09-29 LAB — BASIC METABOLIC PANEL
Anion gap: 11 (ref 5–15)
BUN: 19 mg/dL (ref 6–20)
CHLORIDE: 107 mmol/L (ref 101–111)
CO2: 24 mmol/L (ref 22–32)
CREATININE: 0.92 mg/dL (ref 0.61–1.24)
Calcium: 9.6 mg/dL (ref 8.9–10.3)
GFR calc Af Amer: >60 mL/min (ref >60)
GFR calc non Af Amer: >60 mL/min (ref >60)
Glucose, Bld: 120 mg/dL — ABNORMAL HIGH (ref 65–99)
POTASSIUM: 4.1 mmol/L (ref 3.5–5.1)
Sodium: 142 mmol/L (ref 135–145)

## 2017-09-29 LAB — SURGICAL PCR SCREEN
MRSA, PCR: NEGATIVE
Staphylococcus aureus: POSITIVE — AB

## 2017-09-29 LAB — CBC
HEMATOCRIT: 43.7 % (ref 40.0–52.0)
HEMOGLOBIN: 14.6 g/dL (ref 13.0–18.0)
MCH: 30 pg (ref 26.0–34.0)
MCHC: 33.4 g/dL (ref 32.0–36.0)
MCV: 90 fL (ref 80.0–100.0)
Platelets: 221 10*3/uL (ref 150–440)
RBC: 4.86 MIL/uL (ref 4.40–5.90)
RDW: 13.1 % (ref 11.5–14.5)
WBC: 7.9 10*3/uL (ref 3.8–10.6)

## 2017-09-29 LAB — SEDIMENTATION RATE: SED RATE: 30 mm/h — AB (ref 0–20)

## 2017-09-29 LAB — URINALYSIS, COMPLETE (UACMP) WITH MICROSCOPIC
BILIRUBIN URINE: NEGATIVE
Bacteria, UA: NONE SEEN
GLUCOSE, UA: NEGATIVE mg/dL
Hgb urine dipstick: NEGATIVE
KETONES UR: NEGATIVE mg/dL
LEUKOCYTES UA: NEGATIVE
NITRITE: NEGATIVE
PH: 5 (ref 5.0–8.0)
Protein, ur: NEGATIVE mg/dL
RBC / HPF: NONE SEEN RBC/hpf (ref 0–5)
SPECIFIC GRAVITY, URINE: 1.026 (ref 1.005–1.030)
SQUAMOUS EPITHELIAL / LPF: NONE SEEN

## 2017-09-29 LAB — TYPE AND SCREEN
ABO/RH(D): O POS
ANTIBODY SCREEN: NEGATIVE

## 2017-09-29 LAB — PROTIME-INR
INR: 0.96
Prothrombin Time: 12.7 seconds (ref 11.4–15.2)

## 2017-09-29 LAB — APTT: aPTT: 31 seconds (ref 24–36)

## 2017-09-29 NOTE — Pre-Procedure Instructions (Signed)
Positive Staph results faxed to Dr. Rudene Christians office.

## 2017-09-29 NOTE — Patient Instructions (Signed)
Your procedure is scheduled on: October 14, 2017 (Thursday ) Report toSame Day Surgery.([facility] ) SECOND FLOOR To find out your arrival time please call [phone] between [place] on DECEMBER. 26, 2018 (Wednesday )  Remember: Instructions that are not followed completely may result in serious medical risk, up to and including death, or upon the discretion of your surgeon and anesthesiologist your surgery may need to be rescheduled.     _X__ 1. Do not eat food after midnight the night before your procedure.                 No gum chewing or hard candies. You may drink clear liquids up to 2 hours                 before you are scheduled to arrive for your surgery- DO not drink clear                 liquids within 2 hours of the start of your surgery.                 Clear Liquids include:  water, apple juice without pulp, clear carbohydrate                 drink such as Clearfast of Gart[place], Black Coffee or Tea (Do not add                 anything to coffee or tea).     _X__ 2.  No Alcohol for 24 hours before or after surgery.   _X__ 3.  Do Not Smoke or use e-cigarettes For 24 Hours Prior to Your Surgery.                 Do not use any chewable tobacco products for at least 6 hours prior to                 surgery.  ____  4.  Bring all medications with you on the day of surgery if instructed.   __X__  5.  Notify your doctor if there is any change in your medical condition      (cold, fever, infections).     Do not wear jewelry, make-up, hairpins, clips or nail polish. Do not wear lotions, powders, or perfumes.  Do not shave 48 hours prior to surgery. Men may shave face and neck. Do not bring valuables to the hospital.    [facility] is not responsible for any belongings or valuables.  Contacts, dentures or bridgework may not be worn into surgery. Leave your suitcase in the car. After surgery it may be brought to your room. For patients admitted to  the hospital, discharge time is determined by your treatment team.   Patients discharged the day of surgery will not be allowed to drive home.   Please read over the following fact sheets that you were given:   MRSA Information          ____ Take these medicines the morning of surgery with A SIP OF WATER:    1.   2.   3.   4.  5.  6.  ____ Fleet Enema (as directed)   _X___ Use CHG Soap as directed  ____ Use inhalers on the day of surgery  ____ Stop metformin 2 days prior to surgery    ____ Take 1/2 of usual insulin dose the night before surgery. No insulin the morning  of surgery.   _X___ Stop Coumadin/Plavix/aspirin on (NO ASPIRIN )  _X___ Stop Anti-inflammatories on (NO ASPIRIN PRODUCTS SUCH AS MOTRIN, ADVIL ,ALEVE, IBUPROFEN, BC'S, GOODY'S , EXCEDRIN ) OK TO TAKE PAIN MEDICATION IF NEEDED     _X_ Stop supplements until after surgery.  (STOP Benedict )  ____ Bring C-Pap to the hospital.

## 2017-09-30 LAB — URINE CULTURE: CULTURE: NO GROWTH

## 2017-10-13 MED ORDER — TRANEXAMIC ACID 1000 MG/10ML IV SOLN
1000.0000 mg | INTRAVENOUS | Status: AC
  Administered 2017-10-14: 1000 mg via INTRAVENOUS
  Filled 2017-10-13: qty 10

## 2017-10-13 MED ORDER — CEFAZOLIN SODIUM-DEXTROSE 2-4 GM/100ML-% IV SOLN
2.0000 g | Freq: Once | INTRAVENOUS | Status: AC
  Administered 2017-10-14: 2 g via INTRAVENOUS

## 2017-10-14 ENCOUNTER — Inpatient Hospital Stay: Payer: BLUE CROSS/BLUE SHIELD | Admitting: Certified Registered"

## 2017-10-14 ENCOUNTER — Inpatient Hospital Stay
Admission: RE | Admit: 2017-10-14 | Discharge: 2017-10-16 | DRG: 470 | Disposition: A | Discharge status: Home Health | Payer: BLUE CROSS/BLUE SHIELD | Source: Ambulatory Visit | Attending: Orthopedic Surgery | Admitting: Orthopedic Surgery

## 2017-10-14 ENCOUNTER — Encounter
Admission: RE | Disposition: A | Discharge status: Home Health | Payer: Self-pay | Source: Ambulatory Visit | Attending: Orthopedic Surgery

## 2017-10-14 ENCOUNTER — Other Ambulatory Visit: Payer: Self-pay

## 2017-10-14 ENCOUNTER — Inpatient Hospital Stay: Payer: BLUE CROSS/BLUE SHIELD

## 2017-10-14 DIAGNOSIS — M1712 Unilateral primary osteoarthritis, left knee: Secondary | ICD-10-CM | POA: Diagnosis present

## 2017-10-14 DIAGNOSIS — G8918 Other acute postprocedural pain: Secondary | ICD-10-CM

## 2017-10-14 DIAGNOSIS — G8929 Other chronic pain: Secondary | ICD-10-CM | POA: Diagnosis present

## 2017-10-14 DIAGNOSIS — G2581 Restless legs syndrome: Secondary | ICD-10-CM | POA: Diagnosis present

## 2017-10-14 DIAGNOSIS — Z85820 Personal history of malignant melanoma of skin: Secondary | ICD-10-CM

## 2017-10-14 DIAGNOSIS — K219 Gastro-esophageal reflux disease without esophagitis: Secondary | ICD-10-CM | POA: Diagnosis present

## 2017-10-14 DIAGNOSIS — F17200 Nicotine dependence, unspecified, uncomplicated: Secondary | ICD-10-CM | POA: Diagnosis present

## 2017-10-14 DIAGNOSIS — M545 Low back pain: Secondary | ICD-10-CM | POA: Diagnosis present

## 2017-10-14 HISTORY — PX: TOTAL KNEE ARTHROPLASTY: SHX125

## 2017-10-14 LAB — CBC
HEMATOCRIT: 40.7 % (ref 40.0–52.0)
HEMOGLOBIN: 13.4 g/dL (ref 13.0–18.0)
MCH: 30.3 pg (ref 26.0–34.0)
MCHC: 32.9 g/dL (ref 32.0–36.0)
MCV: 92 fL (ref 80.0–100.0)
Platelets: 185 10*3/uL (ref 150–440)
RBC: 4.43 MIL/uL (ref 4.40–5.90)
RDW: 13.4 % (ref 11.5–14.5)
WBC: 12.5 10*3/uL — AB (ref 3.8–10.6)

## 2017-10-14 LAB — TYPE AND SCREEN
ABO/RH(D): O POS
ANTIBODY SCREEN: NEGATIVE

## 2017-10-14 LAB — CREATININE, SERUM: CREATININE: 1.12 mg/dL (ref 0.61–1.24)

## 2017-10-14 SURGERY — ARTHROPLASTY, KNEE, TOTAL
Anesthesia: General | Site: Knee | Laterality: Left | Wound class: Clean

## 2017-10-14 MED ORDER — MORPHINE SULFATE 10 MG/ML IJ SOLN
INTRAMUSCULAR | Status: DC | PRN
  Administered 2017-10-14: 10 mg

## 2017-10-14 MED ORDER — SODIUM CHLORIDE 0.9 % IV SOLN
INTRAVENOUS | Status: DC
  Administered 2017-10-14 (×2): via INTRAVENOUS

## 2017-10-14 MED ORDER — HYDROMORPHONE HCL 1 MG/ML IJ SOLN
INTRAMUSCULAR | Status: AC
  Filled 2017-10-14: qty 1

## 2017-10-14 MED ORDER — MIDAZOLAM HCL 5 MG/5ML IJ SOLN
INTRAMUSCULAR | Status: DC | PRN
  Administered 2017-10-14: 2 mg via INTRAVENOUS

## 2017-10-14 MED ORDER — ONDANSETRON HCL 4 MG/2ML IJ SOLN
INTRAMUSCULAR | Status: DC | PRN
  Administered 2017-10-14: 4 mg via INTRAVENOUS

## 2017-10-14 MED ORDER — BUPIVACAINE LIPOSOME 1.3 % IJ SUSP
INTRAMUSCULAR | Status: AC
  Filled 2017-10-14: qty 20

## 2017-10-14 MED ORDER — DIPHENHYDRAMINE HCL 12.5 MG/5ML PO ELIX: 12.5000 mg | ORAL_SOLUTION | ORAL | Status: DC | PRN

## 2017-10-14 MED ORDER — ONDANSETRON HCL 4 MG PO TABS: 4.0000 mg | ORAL_TABLET | Freq: Four times a day (QID) | ORAL | Status: DC | PRN

## 2017-10-14 MED ORDER — METHOCARBAMOL 1000 MG/10ML IJ SOLN
500.0000 mg | Freq: Four times a day (QID) | INTRAMUSCULAR | Status: DC | PRN
  Filled 2017-10-14: qty 5

## 2017-10-14 MED ORDER — ACETAMINOPHEN 10 MG/ML IV SOLN
INTRAVENOUS | Status: AC
  Filled 2017-10-14: qty 100

## 2017-10-14 MED ORDER — CHLORHEXIDINE GLUCONATE CLOTH 2 % EX PADS
6.0000 | MEDICATED_PAD | Freq: Every day | CUTANEOUS | Status: DC
  Administered 2017-10-14 – 2017-10-16 (×3): 6 via TOPICAL

## 2017-10-14 MED ORDER — FENTANYL CITRATE (PF) 100 MCG/2ML IJ SOLN
INTRAMUSCULAR | Status: AC
  Filled 2017-10-14: qty 2

## 2017-10-14 MED ORDER — PROPOFOL 10 MG/ML IV BOLUS
INTRAVENOUS | Status: DC | PRN
  Administered 2017-10-14: 200 mg via INTRAVENOUS

## 2017-10-14 MED ORDER — LIDOCAINE HCL (PF) 2 % IJ SOLN
INTRAMUSCULAR | Status: DC | PRN
  Administered 2017-10-14: 50 mg

## 2017-10-14 MED ORDER — MUPIROCIN 2 % EX OINT
1.0000 "application " | TOPICAL_OINTMENT | Freq: Two times a day (BID) | CUTANEOUS | Status: DC
  Administered 2017-10-14 – 2017-10-16 (×5): 1 via NASAL
  Filled 2017-10-14: qty 22

## 2017-10-14 MED ORDER — MIDAZOLAM HCL 2 MG/2ML IJ SOLN
INTRAMUSCULAR | Status: AC
  Filled 2017-10-14: qty 2

## 2017-10-14 MED ORDER — MAGNESIUM HYDROXIDE 400 MG/5ML PO SUSP
30.0000 mL | Freq: Every day | ORAL | Status: DC | PRN
  Administered 2017-10-14: 30 mL via ORAL
  Filled 2017-10-14: qty 30

## 2017-10-14 MED ORDER — ACETAMINOPHEN 325 MG PO TABS: 650.0000 mg | ORAL_TABLET | ORAL | Status: DC | PRN

## 2017-10-14 MED ORDER — CEFAZOLIN SODIUM-DEXTROSE 2-4 GM/100ML-% IV SOLN
INTRAVENOUS | Status: AC
  Filled 2017-10-14: qty 100

## 2017-10-14 MED ORDER — FAMOTIDINE 20 MG PO TABS
20.0000 mg | ORAL_TABLET | Freq: Once | ORAL | Status: AC
  Administered 2017-10-14: 20 mg via ORAL

## 2017-10-14 MED ORDER — DEXAMETHASONE SODIUM PHOSPHATE 10 MG/ML IJ SOLN
INTRAMUSCULAR | Status: DC | PRN
  Administered 2017-10-14: 5 mg via INTRAVENOUS

## 2017-10-14 MED ORDER — ONDANSETRON HCL 4 MG/2ML IJ SOLN: 4.0000 mg | Freq: Once | INTRAMUSCULAR | Status: DC | PRN

## 2017-10-14 MED ORDER — KETOROLAC TROMETHAMINE 30 MG/ML IJ SOLN
INTRAMUSCULAR | Status: AC
  Filled 2017-10-14: qty 1

## 2017-10-14 MED ORDER — BISACODYL 10 MG RE SUPP: 10.0000 mg | Freq: Every day | RECTAL | Status: DC | PRN

## 2017-10-14 MED ORDER — FENTANYL CITRATE (PF) 100 MCG/2ML IJ SOLN
INTRAMUSCULAR | Status: DC | PRN
  Administered 2017-10-14 (×3): 50 ug via INTRAVENOUS

## 2017-10-14 MED ORDER — ACETAMINOPHEN 650 MG RE SUPP: 650.0000 mg | RECTAL | Status: DC | PRN

## 2017-10-14 MED ORDER — HYDROMORPHONE HCL 1 MG/ML IJ SOLN: 0.5000 mg | INTRAMUSCULAR | Status: DC | PRN

## 2017-10-14 MED ORDER — DOCUSATE SODIUM 100 MG PO CAPS
100.0000 mg | ORAL_CAPSULE | Freq: Two times a day (BID) | ORAL | Status: DC
  Administered 2017-10-14 – 2017-10-15 (×4): 100 mg via ORAL
  Filled 2017-10-14 (×4): qty 1

## 2017-10-14 MED ORDER — MENTHOL 3 MG MT LOZG
1.0000 | LOZENGE | OROMUCOSAL | Status: DC | PRN
  Filled 2017-10-14: qty 9

## 2017-10-14 MED ORDER — BUPIVACAINE-EPINEPHRINE (PF) 0.25% -1:200000 IJ SOLN
INTRAMUSCULAR | Status: DC | PRN
  Administered 2017-10-14: 30 mL

## 2017-10-14 MED ORDER — NICOTINE 14 MG/24HR TD PT24
14.0000 mg | MEDICATED_PATCH | Freq: Every day | TRANSDERMAL | Status: DC
  Administered 2017-10-14 – 2017-10-16 (×3): 14 mg via TRANSDERMAL
  Filled 2017-10-14 (×3): qty 1

## 2017-10-14 MED ORDER — DEXTROSE 5 % IV SOLN
2.0000 g | Freq: Four times a day (QID) | INTRAVENOUS | Status: AC
  Administered 2017-10-14 (×3): 2 g via INTRAVENOUS
  Filled 2017-10-14 (×4): qty 20

## 2017-10-14 MED ORDER — PHENYLEPHRINE HCL 10 MG/ML IJ SOLN
INTRAMUSCULAR | Status: AC
  Filled 2017-10-14: qty 1

## 2017-10-14 MED ORDER — LACTATED RINGERS IV SOLN
INTRAVENOUS | Status: DC
  Administered 2017-10-14 (×2): via INTRAVENOUS

## 2017-10-14 MED ORDER — ENOXAPARIN SODIUM 40 MG/0.4ML ~~LOC~~ SOLN: 40.0000 mg | SUBCUTANEOUS | 0 refills | Status: DC

## 2017-10-14 MED ORDER — SODIUM CHLORIDE 0.9 % IJ SOLN
INTRAMUSCULAR | Status: DC | PRN
  Administered 2017-10-14: 30 mL

## 2017-10-14 MED ORDER — BUPIVACAINE HCL (PF) 0.5 % IJ SOLN
INTRAMUSCULAR | Status: AC
  Filled 2017-10-14: qty 10

## 2017-10-14 MED ORDER — NALOXONE HCL 2 MG/2ML IJ SOSY
PREFILLED_SYRINGE | INTRAMUSCULAR | Status: AC
  Filled 2017-10-14: qty 2

## 2017-10-14 MED ORDER — MORPHINE SULFATE (PF) 10 MG/ML IV SOLN
INTRAVENOUS | Status: AC
  Filled 2017-10-14: qty 1

## 2017-10-14 MED ORDER — ALUM & MAG HYDROXIDE-SIMETH 200-200-20 MG/5ML PO SUSP
30.0000 mL | ORAL | Status: DC | PRN
  Administered 2017-10-15 (×2): 30 mL via ORAL
  Filled 2017-10-14 (×2): qty 30

## 2017-10-14 MED ORDER — SODIUM CHLORIDE 0.9 % IV SOLN
INTRAVENOUS | Status: DC | PRN
  Administered 2017-10-14: 60 mL

## 2017-10-14 MED ORDER — METHOCARBAMOL 500 MG PO TABS
500.0000 mg | ORAL_TABLET | Freq: Four times a day (QID) | ORAL | Status: DC | PRN
  Administered 2017-10-14 – 2017-10-16 (×3): 500 mg via ORAL
  Filled 2017-10-14 (×3): qty 1

## 2017-10-14 MED ORDER — METOCLOPRAMIDE HCL 5 MG/ML IJ SOLN: 5.0000 mg | Freq: Three times a day (TID) | INTRAMUSCULAR | Status: DC | PRN

## 2017-10-14 MED ORDER — LIDOCAINE HCL (PF) 2 % IJ SOLN
INTRAMUSCULAR | Status: AC
  Filled 2017-10-14: qty 10

## 2017-10-14 MED ORDER — PROPOFOL 10 MG/ML IV BOLUS
INTRAVENOUS | Status: AC
  Filled 2017-10-14: qty 20

## 2017-10-14 MED ORDER — OXYCODONE HCL 5 MG PO TABS
10.0000 mg | ORAL_TABLET | ORAL | Status: DC | PRN
  Administered 2017-10-14 – 2017-10-16 (×11): 10 mg via ORAL
  Filled 2017-10-14 (×10): qty 2

## 2017-10-14 MED ORDER — ENOXAPARIN SODIUM 30 MG/0.3ML ~~LOC~~ SOLN
30.0000 mg | Freq: Two times a day (BID) | SUBCUTANEOUS | Status: DC
  Administered 2017-10-15 – 2017-10-16 (×2): 30 mg via SUBCUTANEOUS
  Filled 2017-10-14 (×3): qty 0.3

## 2017-10-14 MED ORDER — SODIUM CHLORIDE 0.9 % IJ SOLN
INTRAMUSCULAR | Status: AC
  Filled 2017-10-14: qty 50

## 2017-10-14 MED ORDER — ZOLPIDEM TARTRATE 5 MG PO TABS
5.0000 mg | ORAL_TABLET | Freq: Every evening | ORAL | Status: DC | PRN
  Administered 2017-10-15: 5 mg via ORAL
  Filled 2017-10-14: qty 1

## 2017-10-14 MED ORDER — METOCLOPRAMIDE HCL 10 MG PO TABS: 5.0000 mg | ORAL_TABLET | Freq: Three times a day (TID) | ORAL | Status: DC | PRN

## 2017-10-14 MED ORDER — NEOMYCIN-POLYMYXIN B GU 40-200000 IR SOLN
Status: DC | PRN
  Administered 2017-10-14: 14 mL

## 2017-10-14 MED ORDER — PHENYLEPHRINE HCL 10 MG/ML IJ SOLN
INTRAMUSCULAR | Status: DC | PRN
  Administered 2017-10-14 (×2): 200 ug via INTRAVENOUS

## 2017-10-14 MED ORDER — FENTANYL CITRATE (PF) 100 MCG/2ML IJ SOLN: 25.0000 ug | INTRAMUSCULAR | Status: DC | PRN

## 2017-10-14 MED ORDER — FAMOTIDINE 20 MG PO TABS
ORAL_TABLET | ORAL | Status: AC
  Filled 2017-10-14: qty 1

## 2017-10-14 MED ORDER — PHENOL 1.4 % MT LIQD
1.0000 | OROMUCOSAL | Status: DC | PRN
  Filled 2017-10-14: qty 177

## 2017-10-14 MED ORDER — GLYCOPYRROLATE 0.2 MG/ML IJ SOLN
INTRAMUSCULAR | Status: DC | PRN
  Administered 2017-10-14: 0.2 mg via INTRAVENOUS

## 2017-10-14 MED ORDER — BUPIVACAINE HCL (PF) 0.25 % IJ SOLN
INTRAMUSCULAR | Status: AC
  Filled 2017-10-14: qty 30

## 2017-10-14 MED ORDER — KETAMINE HCL 50 MG/ML IJ SOLN
INTRAMUSCULAR | Status: AC
  Filled 2017-10-14: qty 10

## 2017-10-14 MED ORDER — GLYCOPYRROLATE 0.2 MG/ML IJ SOLN
INTRAMUSCULAR | Status: AC
  Filled 2017-10-14: qty 1

## 2017-10-14 MED ORDER — MAGNESIUM CITRATE PO SOLN
1.0000 | Freq: Once | ORAL | Status: DC | PRN
  Filled 2017-10-14: qty 296

## 2017-10-14 MED ORDER — NALOXONE HCL 2 MG/2ML IJ SOSY
20.0000 ug | PREFILLED_SYRINGE | INTRAMUSCULAR | Status: DC | PRN
  Administered 2017-10-14: 20 ug via INTRAVENOUS

## 2017-10-14 MED ORDER — DEXAMETHASONE SODIUM PHOSPHATE 10 MG/ML IJ SOLN
INTRAMUSCULAR | Status: AC
  Filled 2017-10-14: qty 1

## 2017-10-14 MED ORDER — EPINEPHRINE PF 1 MG/ML IJ SOLN
INTRAMUSCULAR | Status: AC
  Filled 2017-10-14: qty 1

## 2017-10-14 MED ORDER — ONDANSETRON HCL 4 MG/2ML IJ SOLN
INTRAMUSCULAR | Status: AC
  Filled 2017-10-14: qty 2

## 2017-10-14 MED ORDER — ONDANSETRON HCL 4 MG/2ML IJ SOLN: 4.0000 mg | Freq: Four times a day (QID) | INTRAMUSCULAR | Status: DC | PRN

## 2017-10-14 MED ORDER — NEOMYCIN-POLYMYXIN B GU 40-200000 IR SOLN
Status: AC
  Filled 2017-10-14: qty 20

## 2017-10-14 MED ORDER — OXYCODONE HCL 5 MG PO TABS
5.0000 mg | ORAL_TABLET | ORAL | Status: DC | PRN
  Administered 2017-10-14: 5 mg via ORAL
  Filled 2017-10-14: qty 1

## 2017-10-14 MED ORDER — PROPOFOL 500 MG/50ML IV EMUL
INTRAVENOUS | Status: AC
  Filled 2017-10-14: qty 50

## 2017-10-14 MED ORDER — KETAMINE HCL 50 MG/ML IJ SOLN
INTRAMUSCULAR | Status: DC | PRN
  Administered 2017-10-14 (×2): 25 mg via INTRAMUSCULAR

## 2017-10-14 MED ORDER — OXYCODONE HCL 5 MG PO TABS: 5.0000 mg | ORAL_TABLET | ORAL | 0 refills | Status: DC | PRN

## 2017-10-14 MED ORDER — HYDROMORPHONE HCL 1 MG/ML IJ SOLN
INTRAMUSCULAR | Status: DC | PRN
  Administered 2017-10-14: 0.5 mg via INTRAVENOUS
  Administered 2017-10-14: 1 mg via INTRAVENOUS

## 2017-10-14 MED ORDER — ACETAMINOPHEN 10 MG/ML IV SOLN
INTRAVENOUS | Status: DC | PRN
  Administered 2017-10-14: 1000 mg via INTRAVENOUS

## 2017-10-14 MED ORDER — FENTANYL CITRATE (PF) 100 MCG/2ML IJ SOLN
INTRAMUSCULAR | Status: AC
Start: 2017-10-14 — End: 2017-10-14
  Filled 2017-10-14: qty 2

## 2017-10-14 MED ORDER — SODIUM CHLORIDE FLUSH 0.9 % IV SOLN
INTRAVENOUS | Status: AC
Start: 2017-10-14 — End: 2017-10-14
  Filled 2017-10-14: qty 10

## 2017-10-14 MED ORDER — KETOROLAC TROMETHAMINE 30 MG/ML IJ SOLN
INTRAMUSCULAR | Status: DC | PRN
  Administered 2017-10-14: 30 mg

## 2017-10-14 SURGICAL SUPPLY — 62 items
BANDAGE ACE 6X5 VEL STRL LF (GAUZE/BANDAGES/DRESSINGS) ×3 IMPLANT
BLADE SAW 1 (BLADE) ×3 IMPLANT
BLOCK CUTTING TIBIAL 4 MED (MISCELLANEOUS) IMPLANT
BLOCK CUTTING TIBIAL 5 LT (MISCELLANEOUS) IMPLANT
CANISTER SUCT 1200ML W/VALVE (MISCELLANEOUS) ×3 IMPLANT
CANISTER SUCT 3000ML PPV (MISCELLANEOUS) ×6 IMPLANT
CAPT KNEE TOTAL 3 ×3 IMPLANT
CEMENT HV SMART SET (Cement) ×6 IMPLANT
CHLORAPREP W/TINT 26ML (MISCELLANEOUS) ×6 IMPLANT
COOLER POLAR GLACIER W/PUMP (MISCELLANEOUS) ×3 IMPLANT
CUFF TOURN 24 STER (MISCELLANEOUS) IMPLANT
CUFF TOURN 30 STER DUAL PORT (MISCELLANEOUS) IMPLANT
DRAPE SHEET LG 3/4 BI-LAMINATE (DRAPES) ×6 IMPLANT
ELECT CAUTERY BLADE 6.4 (BLADE) ×3 IMPLANT
ELECT REM PT RETURN 9FT ADLT (ELECTROSURGICAL) ×3
ELECTRODE REM PT RTRN 9FT ADLT (ELECTROSURGICAL) ×1 IMPLANT
FEM COMP 5 LT CEMENT 02120005L (Femur) ×3 IMPLANT
FEMURE BONE MODEL IMPLANT
GAUZE PETRO XEROFOAM 1X8 (MISCELLANEOUS) ×3 IMPLANT
GAUZE SPONGE 4X4 12PLY STRL (GAUZE/BANDAGES/DRESSINGS) ×3 IMPLANT
GLOVE BIOGEL PI IND STRL 9 (GLOVE) ×1 IMPLANT
GLOVE BIOGEL PI INDICATOR 9 (GLOVE) ×2
GLOVE INDICATOR 8.0 STRL GRN (GLOVE) ×3 IMPLANT
GLOVE SURG ORTHO 8.0 STRL STRW (GLOVE) ×3 IMPLANT
GLOVE SURG SYN 9.0  PF PI (GLOVE) ×2
GLOVE SURG SYN 9.0 PF PI (GLOVE) ×1 IMPLANT
GOWN SRG 2XL LVL 4 RGLN SLV (GOWNS) ×1 IMPLANT
GOWN STRL NON-REIN 2XL LVL4 (GOWNS) ×2
GOWN STRL REUS W/ TWL LRG LVL3 (GOWN DISPOSABLE) ×1 IMPLANT
GOWN STRL REUS W/ TWL XL LVL3 (GOWN DISPOSABLE) ×1 IMPLANT
GOWN STRL REUS W/TWL LRG LVL3 (GOWN DISPOSABLE) ×2
GOWN STRL REUS W/TWL XL LVL3 (GOWN DISPOSABLE) ×2
HOLDER FOLEY CATH W/STRAP (MISCELLANEOUS) ×3 IMPLANT
HOOD PEEL AWAY FLYTE STAYCOOL (MISCELLANEOUS) ×6 IMPLANT
IMMBOLIZER KNEE 19 BLUE UNIV (SOFTGOODS) ×3 IMPLANT
KIT RM TURNOVER STRD PROC AR (KITS) ×3 IMPLANT
KNEE MEDACTA TIBIAL/FEMORAL BL (Knees) ×3 IMPLANT
KNIFE SCULPS 14X20 (INSTRUMENTS) ×3 IMPLANT
NDL SAFETY ECLIPSE 18X1.5 (NEEDLE) ×1 IMPLANT
NEEDLE HYPO 18GX1.5 SHARP (NEEDLE) ×2
NEEDLE SPNL 18GX3.5 QUINCKE PK (NEEDLE) ×3 IMPLANT
NEEDLE SPNL 20GX3.5 QUINCKE YW (NEEDLE) ×3 IMPLANT
NS IRRIG 1000ML POUR BTL (IV SOLUTION) ×3 IMPLANT
PACK TOTAL KNEE (MISCELLANEOUS) ×3 IMPLANT
PAD WRAPON POLAR KNEE (MISCELLANEOUS) ×1 IMPLANT
PULSAVAC PLUS IRRIG FAN TIP (DISPOSABLE) ×3
SOL .9 NS 3000ML IRR  AL (IV SOLUTION) ×2
SOL .9 NS 3000ML IRR UROMATIC (IV SOLUTION) ×1 IMPLANT
STAPLER SKIN PROX 35W (STAPLE) ×3 IMPLANT
SUCTION FRAZIER HANDLE 10FR (MISCELLANEOUS) ×2
SUCTION TUBE FRAZIER 10FR DISP (MISCELLANEOUS) ×1 IMPLANT
SUT DVC 2 QUILL PDO  T11 36X36 (SUTURE) ×2
SUT DVC 2 QUILL PDO T11 36X36 (SUTURE) ×1 IMPLANT
SUT V-LOC 90 ABS DVC 3-0 CL (SUTURE) ×3 IMPLANT
SYR 20CC LL (SYRINGE) ×3 IMPLANT
SYR 50ML LL SCALE MARK (SYRINGE) ×6 IMPLANT
TIBIAL BONE MODEL LEFT (MISCELLANEOUS) IMPLANT
TIP FAN IRRIG PULSAVAC PLUS (DISPOSABLE) ×1 IMPLANT
TOWEL OR 17X26 4PK STRL BLUE (TOWEL DISPOSABLE) ×3 IMPLANT
TOWER CARTRIDGE SMART MIX (DISPOSABLE) ×3 IMPLANT
TRAY FOLEY W/METER SILVER 16FR (SET/KITS/TRAYS/PACK) ×3 IMPLANT
WRAPON POLAR PAD KNEE (MISCELLANEOUS) ×3

## 2017-10-14 NOTE — OR Nursing (Signed)
Urine drug screen ordered. Patient unable to void. Dr Andree Elk aware and okay to proceed to surgery.

## 2017-10-14 NOTE — Evaluation (Signed)
Physical Therapy Evaluation Patient Details Name: Anthony Nguyen MRN: [id number] DOB: 1956-08-19 Today's Date: 10/14/2017   History of Present Illness  admitted for acute hospitalization status post L TKR (10/14/17), WBAT.  Clinical Impression  Upon evaluation, patient alert and oriented; follows all commands and demonstrates good effort/participation with all functional activities.  Demonstrates good post-op L LE strength (3-/5) and ROM (4-98 degrees) with full sensory return reported.  Good activation of L quad with isolated therex, though mild difficulty with anti-gravity movement, mild lag with SLR.  Currently able to complete bed mobility with mod indep; sit/stand, basic transfers and gait (50') with RW, cga.  Step to progressing to step through gait pattern with good L knee control during loading phases of gait cycle.  Anticipate good progression towards all mobility goals. Would benefit from skilled PT to address above deficits and promote optimal return to PLOF; recommend transition to home with outpatient PT follow up.    Follow Up Recommendations Outpatient PT    Equipment Recommendations  Rolling walker with 5" wheels    Recommendations for Other Services       Precautions / Restrictions Precautions Precautions: Fall Restrictions Weight Bearing Restrictions: Yes LLE Weight Bearing: Weight bearing as tolerated      Mobility  Bed Mobility Overal bed mobility: Modified Independent                Transfers Overall transfer level: Needs assistance Equipment used: Rolling walker (2 wheeled) Transfers: Sit to/from Stand Sit to Stand: Min guard         General transfer comment: fair use of L LE with movement transition; encouraged for WB as tolerated  Ambulation/Gait Ambulation/Gait assistance: Min guard Ambulation Distance (Feet): 50 Feet Assistive device: Rolling walker (2 wheeled)       General Gait Details: step to progressing to step through gait  pattern as distance increases; fair/good tolerance for WBing L LE with good knee control/stability.  Slow, guarded cadence but good effort and participation.  Stairs            Wheelchair Mobility    Modified Rankin (Stroke Patients Only)       Balance Overall balance assessment: Needs assistance Sitting-balance support: Feet supported;Feet unsupported Sitting balance-Leahy Scale: Good     Standing balance support: Bilateral upper extremity supported Standing balance-Leahy Scale: Fair                               Pertinent Vitals/Pain Pain Assessment: Faces Faces Pain Scale: Hurts even more Pain Location: L knee Pain Descriptors / Indicators: Aching;Grimacing;Guarding Pain Intervention(s): Limited activity within patient's tolerance;Monitored during session;Repositioned    Home Living Family/patient expects to be discharged to:: Private residence Living Arrangements: Spouse/significant other Available Help at Discharge: Family Type of Home: House Home Access: Stairs to enter Entrance Stairs-Rails: Right Entrance Stairs-Number of Steps: 3 Home Layout: One level Home Equipment: None      Prior Function Level of Independence: Independent         Comments: Indep with ADLs, household and community mobilization; working-full time; denies fall history.     Hand Dominance        Extremity/Trunk Assessment   Upper Extremity Assessment Upper Extremity Assessment: Overall WFL for tasks assessed    Lower Extremity Assessment Lower Extremity Assessment: (L knee grossly 3-/5, limited by pain; otherwise, grossly 5/5 throughout bilat LEs.  Sensation fully intact and symmetrical)  Communication   Communication: No difficulties  Cognition Arousal/Alertness: Awake/alert Behavior During Therapy: WFL for tasks assessed/performed Overall Cognitive Status: Within Functional Limits for tasks assessed                                         General Comments      Exercises Total Joint Exercises Goniometric ROM: L knee, 4-98 degrees Other Exercises Other Exercises: Supine L LE therex, 1x10, AROM for muscular strength/endurance with functional activities: ankle pumps, quad sets, SAQs, heel slides, hip abduct/adduct and SLR.  Fair/good quad activation, but mild difficulty with initiation during SAQs; good control with SLR, mild lag noted.   Assessment/Plan    PT Assessment Patient needs continued PT services  PT Problem List Decreased strength;Decreased range of motion;Decreased activity tolerance;Decreased balance;Decreased mobility;Decreased coordination;Decreased knowledge of use of DME;Decreased safety awareness;Decreased knowledge of precautions;Pain       PT Treatment Interventions DME instruction;Gait training;Stair training;Functional mobility training;Therapeutic activities;Therapeutic exercise;Balance training;Patient/family education    PT Goals (Current goals can be found in the Care Plan section)  Acute Rehab PT Goals Patient Stated Goal: to get out of the bed PT Goal Formulation: With patient/family Time For Goal Achievement: 10/28/17 Potential to Achieve Goals: Good    Frequency BID   Barriers to discharge        Co-evaluation               AM-PAC PT "6 Clicks" Daily Activity  Outcome Measure Difficulty turning over in bed (including adjusting bedclothes, sheets and blankets)?: A Little Difficulty moving from lying on back to sitting on the side of the bed? : A Little Difficulty sitting down on and standing up from a chair with arms (e.g., wheelchair, bedside commode, etc,.)?: Unable Help needed moving to and from a bed to chair (including a wheelchair)?: A Little Help needed walking in hospital room?: A Little Help needed climbing 3-5 steps with a railing? : A Little 6 Click Score: 16    End of Session Equipment Utilized During Treatment: Gait belt Activity Tolerance: Patient  tolerated treatment well Patient left: in chair;with call bell/phone within reach;with family/visitor present(CNA at bedside for vitals assessment; to place bedside table, connect alarm cord when complete) Nurse Communication: Mobility status PT Visit Diagnosis: Difficulty in walking, not elsewhere classified (R26.2);Muscle weakness (generalized) (M62.81);Pain Pain - Right/Left: Left Pain - part of body: Knee    Time: 6440-3474 PT Time Calculation (min) (ACUTE ONLY): 30 min   Charges:   PT Evaluation $PT Eval Low Complexity: 1 Low PT Treatments $Therapeutic Exercise: 8-22 mins   PT G Codes:   PT G-Codes **NOT FOR INPATIENT CLASS** Functional Assessment Tool Used: AM-PAC 6 Clicks Basic Mobility Functional Limitation: Mobility: Walking and moving around Mobility: Walking and Moving Around Current Status (Q5956): At least 20 percent but less than 40 percent impaired, limited or restricted Mobility: Walking and Moving Around Goal Status [phone]): At least 1 percent but less than 20 percent impaired, limited or restricted    Shaima Sardinas H. Owens Shark, PT, DPT, NCS 10/14/17, 5:07 PM [phone]

## 2017-10-14 NOTE — Transfer of Care (Signed)
Immediate Anesthesia Transfer of Care Note  Patient: Anthony Nguyen  Procedure(s) Performed: TOTAL KNEE ARTHROPLASTY (Left Knee)  Patient Location: PACU  Anesthesia Type:General  Level of Consciousness: sedated  Airway & Oxygen Therapy: Patient Spontanous Breathing and Patient connected to face mask oxygen  Post-op Assessment: Report given to RN and Post -op Vital signs reviewed and stable  Post vital signs: Reviewed  Last Vitals:  Vitals:   10/14/17 0611 10/14/17 0943  BP: 129/75 113/63  Pulse: 81 91  Resp: 14 (!) 8  Temp: 36.6 C (!) 36.3 C  SpO2: 96% 93%    Last Pain:  Vitals:   10/14/17 0611  TempSrc: Oral         Complications: No apparent anesthesia complications

## 2017-10-14 NOTE — Op Note (Signed)
10/14/2017  9:45 AM  PATIENT:  Anthony Nguyen  61 y.o. male  PRE-OPERATIVE DIAGNOSIS:  PRIMARY OSTEOARTHRITIS OF LEFT KNEE  POST-OPERATIVE DIAGNOSIS:  PRIMARY OSTEOARTHRITIS OF LEFT KNEE  PROCEDURE:  Procedure(s): TOTAL KNEE ARTHROPLASTY (Left)  SURGEON: Laurene Footman, MD  ASSISTANTS: Jacqulyn Ducking, RNFA  ANESTHESIA:   general  EBL:  Total I/O In: 1500 [I.V.:1500] Out: 200 [Urine:100; Blood:100]  BLOOD ADMINISTERED:none  DRAINS: none   LOCAL MEDICATIONS USED:  MARCAINE    and OTHER Toradol morphine and Exparel  SPECIMEN:  No Specimen  DISPOSITION OF SPECIMEN:  N/A  COUNTS:  YES  TOURNIQUET:  * Missing tourniquet times found for documented tourniquets in log: [id number] *89 minutes at 300 mmHg  IMPLANTS: [place] sphere 5 left femur, 5 left tibia with 11 mm insert and 3 patella all components cemented with short stem on tibial  DICTATION: .Dragon Dictation  patient brought the operating room and after adequate general anesthesia was obtainedleftleg was prepped and draped in sterile fashion. After patient identification and timeout procedures were completed a midline skin incision was made followed by medial parapatellar arthrotomy. The knee revealed  moderate erosion of the medial compartment without loss of bone as well as advanced patellofemoral degenerative change with exposed bone and mild lateral compartment degenerative change.  The fat pad and anterior cruciate ligament and PCL were excised and the proximal tibia cutting guide was applied to the proximal tibia and proximal tibia cut carried out followed by distal femur cut. Tourniquet raised at the start of the case The 4-in-1 cutting guide was applied to the femur anterior posterior and chamfer cuts made. The posterior horns of the menisci could be resected this time and the size5tibia baseplate trial was placed in apporpriote rotation.. The keel punch was placed followed by the 38femoral trial and a 14mm insert  gave good stability through range of motion. This was thenchosen as the final implant. Distal femur drill holes were made followed by the trochlear groove cut and then trials were removed. The patella was affected with arthritis as welland resection was carried out drilling plate carried out and then a size 3 patella fit well. These trials were all removed and tourniquet wasraisedat this point with hemostasis checked withelectrocautery. Thelocal anesthetic noted above was infiltrated in the para-articular tissue. The the bony surfaces thoroughly irrigated and dried. Tibial component was cemented in place first followed by the plastic insert and then the femoral component and the knee placed in extension with excess cement being removed. Next the patellar button was clamped into place and after the cement entirely set the clamp was removed the patella tracked well. .the tourniquetwas letdown,the knee was thoroughly irrigated and then closed with a heavy [place]ill with 3-0 locking suture followed by skin staples., Xeroform 4 x 4's ABDs and web roll and Ace wrap along with a Polar Care     PLAN OF CARE: Admit to inpatient   PATIENT DISPOSITION:  PACU - hemodynamically stable.

## 2017-10-14 NOTE — Anesthesia Preprocedure Evaluation (Signed)
Anesthesia Evaluation  Patient identified by MRN, date of birth, ID band Patient awake    Reviewed: Allergy & Precautions, H&P , NPO status , Patient's Chart, lab work & pertinent test results, reviewed documented beta blocker date and time   Airway Mallampati: II  TM Distance: >3 FB Neck ROM: full    Dental  (+) Teeth Intact, Edentulous Upper   Pulmonary neg pulmonary ROS, neg shortness of breath, Current Smoker,    Pulmonary exam normal        Cardiovascular Exercise Tolerance: Good (-) hypertensionnegative cardio ROS Normal cardiovascular exam Rate:Normal     Neuro/Psych Chronic low back pain treated with daily oxycodone.  Neuromuscular disease negative neurological ROS  negative psych ROS   GI/Hepatic negative GI ROS, Neg liver ROS, GERD  Medicated,  Endo/Other  negative endocrine ROS  Renal/GU negative Renal ROS  negative genitourinary   Musculoskeletal   Abdominal   Peds  Hematology negative hematology ROS (+)   Anesthesia Other Findings   Reproductive/Obstetrics negative OB ROS                             Anesthesia Physical Anesthesia Plan  ASA: II  Anesthesia Plan: General LMA   Post-op Pain Management:    Induction:   PONV Risk Score and Plan:   Airway Management Planned:   Additional Equipment:   Intra-op Plan:   Post-operative Plan:   Informed Consent: I have reviewed the patients History and Physical, chart, labs and discussed the procedure including the risks, benefits and alternatives for the proposed anesthesia with the patient or authorized representative who has indicated his/her understanding and acceptance.     Plan Discussed with: CRNA  Anesthesia Plan Comments: (Questionable distant history of cocaine use in 2015 as reported by nursing.  Unable to verify this and patient adamantly denies any current use of cocaine or recent history of drug use.   Will defer on UDS as patient unable to provide this and I don't feel that it is necessary based on history.  Pt refuses SAB and we will proceed with LMA GA)        Anesthesia Quick Evaluation

## 2017-10-14 NOTE — Anesthesia Postprocedure Evaluation (Signed)
Anesthesia Post Note  Patient: Anthony Nguyen  Procedure(s) Performed: TOTAL KNEE ARTHROPLASTY (Left Knee)  Patient location during evaluation: PACU Anesthesia Type: General Level of consciousness: awake and alert Pain management: pain level controlled Vital Signs Assessment: post-procedure vital signs reviewed and stable Respiratory status: spontaneous breathing, nonlabored ventilation, respiratory function stable and patient connected to nasal cannula oxygen Cardiovascular status: blood pressure returned to baseline and stable Postop Assessment: no apparent nausea or vomiting Anesthetic complications: no     Last Vitals:  Vitals:   10/14/17 1150 10/14/17 1220  BP: 134/82 110/60  Pulse: 83 75  Resp: 16 16  Temp: (!) 36.3 C (!) 36.3 C  SpO2: 98% 97%    Last Pain:  Vitals:   10/14/17 1220  TempSrc: Oral  PainSc:     LLE Motor Response: Purposeful movement (10/14/17 1300) LLE Sensation: Full sensation;Pain (10/14/17 1300)   RLE Sensation: Full sensation (10/14/17 1300)      Molli Barrows

## 2017-10-14 NOTE — Discharge Summary (Signed)
Physician Discharge Summary  Patient ID: [name] MRN: [id number] DOB/AGE: [birth year] 61 y.o.  Admit date: 10/14/2017 Discharge date: 10/16/2017  Admission Diagnoses:  PRIMARY OSTEOARTHRITIS OF LEFT KNEE  Discharge Diagnoses: Patient Active Problem List   Diagnosis Date Noted  . Primary osteoarthritis of left knee 10/14/2017  . Chronic nasal congestion 11/24/2013  . Benign paroxysmal positional vertigo 11/24/2013  . Sleep disturbance 08/17/2013  . Allergy 08/17/2013  . GERD (gastroesophageal reflux disease) 08/17/2013  . Tobacco abuse counseling 08/17/2013  . Ringworm 08/17/2013  Primary osteoarthritis of the left knee.  Past Medical History:  Diagnosis Date  . Allergy   . Arthritis    osteoarthritis  . Cancer ([place])    melanoma skin cancer  . Current every day smoker   . Diverticulosis   . GERD (gastroesophageal reflux disease)   . H/O diverticulitis of colon   . Restless leg syndrome   . Sciatic pain    Transfusion: None.   Consultants (if any):   Discharged Condition: Improved  Hospital Course: Anthony Nguyen is an 61 y.o. male who was admitted 10/14/2017 with a diagnosis of left knee osteoarthritis and went to the operating room on 10/14/2017 and underwent the above named procedures.   Due to significant knee drainage a Provena Plus Woundvac was applied today.   Surgeries: Procedure(s): TOTAL KNEE ARTHROPLASTY on 10/14/2017 Patient tolerated the surgery well. Taken to PACU where she was stabilized and then transferred to the orthopedic floor.  Started on Lovenox 30mg  q 12 hrs. Foot pumps applied bilaterally at 80 mm. Heels elevated on bed with rolled towels. No evidence of DVT. Negative Homan. Physical therapy started on day #1 for gait training and transfer. OT started day #1 for ADL and assisted devices.  Patient's IV and foley was removed on POD1  Implants: Medacta GMK sphere 5 left femur, 5 left tibia with 11 mm insert and 3 patella all  components cemented with short stem on tibia.  He was given perioperative antibiotics:  Anti-infectives (From admission, onward)   Start     Dose/Rate Route Frequency Ordered Stop   10/14/17 1200  ceFAZolin (ANCEF) 2 g in dextrose 5 % 100 mL IVPB     2 g 240 mL/hr over 30 Minutes Intravenous Every 6 hours 10/14/17 1148 10/15/17 0120   10/14/17 0620  ceFAZolin (ANCEF) 2-4 GM/100ML-% IVPB    Comments:  Anthony Nguyen   : cabinet override      10/14/17 0620 10/14/17 0742   10/13/17 2300  ceFAZolin (ANCEF) IVPB 2g/100 mL premix     2 g 200 mL/hr over 30 Minutes Intravenous  Once 10/13/17 2246 10/14/17 0812    .  He was given sequential compression devices, early ambulation, and Lovenox for DVT prophylaxis.  He benefited maximally from the hospital stay and there were no complications.    Recent vital signs:  Vitals:   10/16/17 0410 10/16/17 0723  BP: (!) 169/75 (!) 164/75  Pulse: (!) 113 (!) 107  Resp: 16 18  Temp: 99.1 F (37.3 C) 98.4 F (36.9 C)  SpO2: 97% 95%    Recent laboratory studies:  Lab Results  Component Value Date   HGB 12.9 (L) 10/16/2017   HGB 12.6 (L) 10/15/2017   HGB 13.4 10/14/2017   Lab Results  Component Value Date   WBC 13.7 (H) 10/16/2017   PLT 187 10/16/2017   Lab Results  Component Value Date   INR 0.96 09/29/2017   Lab Results  Component Value Date  NA 133 (L) 10/16/2017   K 4.0 10/16/2017   CL 98 (L) 10/16/2017   CO2 24 10/16/2017   BUN 13 10/16/2017   CREATININE 1.01 10/16/2017   GLUCOSE 154 (H) 10/16/2017    Discharge Medications:   Allergies as of 10/16/2017   No Known Allergies     Medication List    STOP taking these medications   HYDROcodone-acetaminophen 5-325 MG tablet Commonly known as:  NORCO/VICODIN     TAKE these medications   enoxaparin 40 MG/0.4ML injection Commonly known as:  LOVENOX Inject 0.4 mLs (40 mg total) into the skin daily.   Melatonin 1 MG Tabs Take 1 tablet by mouth at bedtime as needed.    oxyCODONE 5 MG immediate release tablet Commonly known as:  Oxy IR/ROXICODONE Take 1-2 tablets (5-10 mg total) by mouth every 4 (four) hours as needed for moderate pain or severe pain.   oxyCODONE-acetaminophen 5-325 MG tablet Commonly known as:  PERCOCET/ROXICET Take 1 tablet by mouth every 8 (eight) hours as needed for severe pain.            Durable Medical Equipment  (From admission, onward)        Start     Ordered   10/14/17 1149  DME Walker rolling  Once    Question:  Patient needs a walker to treat with the following condition  Answer:  Status post total left knee replacement   10/14/17 1148   10/14/17 1149  DME 3 n 1  Once     10/14/17 1148   10/14/17 1149  DME Bedside commode  Once    Question:  Patient needs a bedside commode to treat with the following condition  Answer:  Status post total left knee replacement   10/14/17 1148      Diagnostic Studies: Dg Knee 1-2 Views Left  Result Date: 10/14/2017 CLINICAL DATA:  61 year old male post knee replacement. Initial encounter. EXAM: LEFT KNEE - 1-2 VIEW COMPARISON:  09/16/2017 CT. FINDINGS: Post total left knee replacement. Minimal lucency along the lateral aspect of the tibial component (see arrow) may represent expected finding. No surrounding fracture. IMPRESSION: Post total left knee replacement. Minimal lucency along the lateral aspect of the tibial component (see arrow) may represent expected finding. Electronically Signed   By: Genia Nguyen M.D.   On: 10/14/2017 10:12   Ct Knee Left Wo Contrast  Result Date: 09/16/2017 CLINICAL DATA:  Chronic left knee pain and swelling. Preoperative study prior to knee replacement. EXAM: CT OF THE LEFT KNEE WITHOUT CONTRAST TECHNIQUE: Multidetector CT imaging of the left knee was performed according to the standard protocol. Multiplanar CT image reconstructions were also generated. COMPARISON:  CT chest, abdomen, and pelvis dated November 07, 2014. FINDINGS:  Bones/Joint/Cartilage No acute fracture or malalignment. Moderate medial compartment joint space narrowing and subchondral sclerosis with marginal osteophyte formation. Mild patellofemoral and lateral compartment joint space narrowing with is fall jaw osteophytes. Trace joint effusion. Small Baker cyst containing a few loose bodies. Mild left hip joint space narrowing. Asymmetric sclerosis of the right parasymphyseal pubic bone, unchanged since January 2016. Limited evaluation of the left ankle is grossly unremarkable. Ligaments Suboptimally assessed by CT. Muscles and Tendons Unremarkable. Soft tissues Scattered atherosclerotic vascular calcifications. Otherwise unremarkable. IMPRESSION: 1. Tricompartmental degenerative changes, moderate in the medial compartment. 2. Small Baker cyst containing a few loose bodies. 3. Mild left hip degenerative changes. Electronically Signed   By: Titus Dubin M.D.   On: 09/16/2017 11:00   Disposition: Plan  will be for discharge from hospital on 10/16/17 with HHPT.  Follow-up Information    Duanne Guess, PA-C Follow up in 14 day(s).   Specialties:  Orthopedic Surgery, Emergency Medicine Why:  Staple Removal. Contact information: [place] [address] [phone]        Katheren Shams. Go on 10/18/2017.   Why:   First outpatient physical therapy appointment at 9:30 AM Contact information: [place] [address] [phone]          Signed: Judson Roch PA-C 10/16/2017, 9:25 AM

## 2017-10-14 NOTE — Discharge Instructions (Signed)
Diet: As you were doing prior to hospitalization   Shower:  May shower but keep the wounds dry, use an occlusive plastic wrap, NO SOAKING IN TUB.  If the bandage gets wet, change with a clean dry gauze.  Dressing:  You may change your dressing as needed. Change the dressing with sterile gauze dressing.    Activity:  Increase activity slowly as tolerated, but follow the weight bearing instructions below.  No lifting or driving for 6 weeks.  Weight Bearing:   Weight bearing as tolerated to left lower extremity.  Blood Clot Prevention: Lovenox 40mg  daily for the next 14 days.  To prevent constipation: you may use a stool softener such as -  Colace (over the counter) 100 mg by mouth twice a day  Drink plenty of fluids (prune juice may be helpful) and high fiber foods Miralax (over the counter) for constipation as needed.    Itching:  If you experience itching with your medications, try taking only a single pain pill, or even half a pain pill at a time.  You may take up to 10 pain pills per day, and you can also use benadryl over the counter for itching or also to help with sleep.   Precautions:  If you experience chest pain or shortness of breath - call 911 immediately for transfer to the hospital emergency department!!  If you develop a fever greater that 101 F, purulent drainage from wound, increased redness or drainage from wound, or calf pain-Call [place]                                              Follow- Up Appointment:  Please call for an appointment to be seen in 2 weeks at [facility]

## 2017-10-14 NOTE — H&P (Signed)
Reviewed paper H+P, will be scanned into chart. No changes noted.  

## 2017-10-14 NOTE — Anesthesia Post-op Follow-up Note (Signed)
Anesthesia QCDR form completed.        

## 2017-10-14 NOTE — Progress Notes (Signed)
Patient is having some bleeding under his bandage. Talked to the Dr. On call and will continue to monitor. Will reinforce if needed and CBC is ordered for the morning.

## 2017-10-14 NOTE — Anesthesia Procedure Notes (Signed)
Procedure Name: LMA Insertion Date/Time: 10/14/2017 7:45 AM Performed by: Rolla Plate, CRNA Pre-anesthesia Checklist: Patient identified, Patient being monitored, Timeout performed, Emergency Drugs available and Suction available Patient Re-evaluated:Patient Re-evaluated prior to induction Oxygen Delivery Method: Circle system utilized Preoxygenation: Pre-oxygenation with 100% oxygen Induction Type: IV induction Ventilation: Mask ventilation without difficulty LMA: LMA inserted LMA Size: 4.5 Tube type: Oral Number of attempts: 1 Placement Confirmation: positive ETCO2 and breath sounds checked- equal and bilateral Tube secured with: Tape Dental Injury: Teeth and Oropharynx as per pre-operative assessment

## 2017-10-15 ENCOUNTER — Encounter: Payer: Self-pay | Admitting: Orthopedic Surgery

## 2017-10-15 LAB — BASIC METABOLIC PANEL
ANION GAP: 8 (ref 5–15)
BUN: 13 mg/dL (ref 6–20)
CHLORIDE: 103 mmol/L (ref 101–111)
CO2: 26 mmol/L (ref 22–32)
Calcium: 8.4 mg/dL — ABNORMAL LOW (ref 8.9–10.3)
Creatinine, Ser: 1.04 mg/dL (ref 0.61–1.24)
Glucose, Bld: 183 mg/dL — ABNORMAL HIGH (ref 65–99)
POTASSIUM: 4.4 mmol/L (ref 3.5–5.1)
SODIUM: 137 mmol/L (ref 135–145)

## 2017-10-15 LAB — CBC
HCT: 37.5 % — ABNORMAL LOW (ref 40.0–52.0)
HEMOGLOBIN: 12.6 g/dL — AB (ref 13.0–18.0)
MCH: 30.6 pg (ref 26.0–34.0)
MCHC: 33.5 g/dL (ref 32.0–36.0)
MCV: 91.1 fL (ref 80.0–100.0)
PLATELETS: 187 10*3/uL (ref 150–440)
RBC: 4.12 MIL/uL — AB (ref 4.40–5.90)
RDW: 13.5 % (ref 11.5–14.5)
WBC: 14.4 10*3/uL — AB (ref 3.8–10.6)

## 2017-10-15 MED ORDER — SIMETHICONE 80 MG PO CHEW
160.0000 mg | CHEWABLE_TABLET | Freq: Once | ORAL | Status: AC
  Administered 2017-10-15: 160 mg via ORAL
  Filled 2017-10-15: qty 2

## 2017-10-15 MED ORDER — LACTULOSE 10 GM/15ML PO SOLN: 10.0000 g | Freq: Every day | ORAL | Status: DC | PRN

## 2017-10-15 NOTE — Progress Notes (Signed)
Subjective: 1 Day Post-Op Procedure(s) (LRB): TOTAL KNEE ARTHROPLASTY (Left) Patient reports pain as mild.   Patient is well, and has had no acute complaints or problems Plan is to go Home after hospital stay. Negative for chest pain and shortness of breath Fever: no Gastrointestinal:Negative for nausea and vomiting  Objective: Vital signs in last 24 hours: Temp:  [97.3 F (36.3 C)-98.3 F (36.8 C)] 98.2 F (36.8 C) (12/28 0003) Pulse Rate:  [75-100] 96 (12/28 0505) Resp:  [7-18] 16 (12/28 0505) BP: (97-148)/(56-95) 128/68 (12/28 0505) SpO2:  [92 %-100 %] 95 % (12/28 0505) Weight:  [99.8 kg (219 lb 16 oz)] 99.8 kg (219 lb 16 oz) (12/27 1215)  Intake/Output from previous day:  Intake/Output Summary (Last 24 hours) at 10/15/2017 0737 Last data filed at 10/15/2017 7782 Gross per 24 hour  Intake 3940 ml  Output 3275 ml  Net 665 ml    Intake/Output this shift: No intake/output data recorded.  Labs: Recent Labs    10/14/17 1419 10/15/17 0359  HGB 13.4 12.6*   Recent Labs    10/14/17 1419 10/15/17 0359  WBC 12.5* 14.4*  RBC 4.43 4.12*  HCT 40.7 37.5*  PLT 185 187   Recent Labs    10/14/17 1419 10/15/17 0359  NA  --  137  K  --  4.4  CL  --  103  CO2  --  26  BUN  --  13  CREATININE 1.12 1.04  GLUCOSE  --  183*  CALCIUM  --  8.4*   No results for input(s): LABPT, INR in the last 72 hours.   EXAM General - Patient is Alert, Appropriate and Oriented Extremity - Sensation intact distally Intact pulses distally Dorsiflexion/Plantar flexion intact Incision: moderate drainage No cellulitis present Dressing/Incision - blood tinged drainage Motor Function - intact, moving foot and toes well on exam.   Abdomen distended with normal BS.  Positive tympany.  Past Medical History:  Diagnosis Date  . Allergy   . Arthritis    osteoarthritis  . Cancer ([place])    melanoma skin cancer  . Current every day smoker   . Diverticulosis   . GERD (gastroesophageal  reflux disease)   . H/O diverticulitis of colon   . Restless leg syndrome   . Sciatic pain     Assessment/Plan: 1 Day Post-Op Procedure(s) (LRB): TOTAL KNEE ARTHROPLASTY (Left) Active Problems:   Primary osteoarthritis of left knee  Estimated body mass index is 28.25 kg/m as calculated from the following:   Height as of this encounter: 6\' 2"  (1.88 m).   Weight as of this encounter: 99.8 kg (219 lb 16 oz). Advance diet Up with therapy D/C IV fluids when tolerating po intake. Advance diet slowly today.  Moderate bloody drainage to the knee, Hg 12.6 this AM. Able to ambulate 50 feet yesterday. Begin working on BM. Will plan on discharge tomorrow, possible discharge later today based on drainage.  DVT Prophylaxis - Lovenox, Foot Pumps and TED hose Weight-Bearing as tolerated to left leg  J. Cameron Proud, PA-C [facility] Orthopaedic Surgery 10/15/2017, 7:37 AM

## 2017-10-15 NOTE — Care Management Note (Signed)
Case Management Note  Patient Details  Name: Anthony Nguyen MRN: [id number] Date of Birth: 08-31-1956  Subjective/Objective:  Met with patient to discuss discharge planning. Patient agreeable to outpatient PT at [facility]. Appointment scheduled with [facility] OP PT for Dec. 31 at 9:30 AM. Patient updated and is agreeable.  He will need a walker. Ordered from [place] with [facility] Pharmacy: [facility] [phone]. Called Lovenox 40 mg # 14 no refills. Cost of Lovenox is $ 10.                 Action/Plan: OP PT at [facility]. Walker to be delivered today by [facility].   Expected Discharge Date:  10/16/17               Expected Discharge Plan:  OP Rehab  In-House Referral:     Discharge planning Services  CM Consult  Post Acute Care Choice:  Durable Medical Equipment Choice offered to:  Patient  DME Arranged:  [place]ford Rile rolling DME Agency:  [place]:    [facility] Agency:     Status of Service:  In process, will continue to follow  If discussed at Long Length of Stay Meetings, dates discussed:    Additional Comments:  Jolly Mango, RN 10/15/2017, 11:22 AM

## 2017-10-15 NOTE — Progress Notes (Signed)
Physical Therapy Treatment Patient Details Name: Anthony Nguyen MRN: [id number] DOB: 06/10/1956 Today's Date: 10/15/2017    History of Present Illness Pt. is a 61 y.o. male who was admitted to [facility] for Left TKR.    PT Comments    Pt agreeable to PT; continues with strong left knee pain with use as expected and decrease some at rest. Pt progressing ambulation distance; cues for improved quality in step lengths, use of rolling walker and left knee extension. Pt able to perform stairs with Min guard; steady. Continued work on flexion and extension stretching as well as quad strengthening. Education provided with significant other as well. Continue PT to progress strength, endurance and quality of functional mobility to allow for an optimal, safe return home.    Follow Up Recommendations  Outpatient PT     Equipment Recommendations  Rolling walker with 5" wheels(received)    Recommendations for Other Services       Precautions / Restrictions Precautions Precautions: Fall Restrictions Weight Bearing Restrictions: Yes LLE Weight Bearing: Weight bearing as tolerated    Mobility  Bed Mobility Overal bed mobility: Modified Independent                Transfers Overall transfer level: Needs assistance Equipment used: Rolling walker (2 wheeled) Transfers: Sit to/from Stand Sit to Stand: Supervision         General transfer comment: Encouraged improved use of LLE with stand and standing QS prior to gait  Ambulation/Gait Ambulation/Gait assistance: Min guard Ambulation Distance (Feet): 160 Feet Assistive device: Rolling walker (2 wheeled) Gait Pattern/deviations: Step-through pattern   Gait velocity interpretation: Below normal speed for age/gender General Gait Details: L knee in flexed position; encouraged QS with stance phase. Also instructed in improved rw placement to avoid stepping too far into rw.    Stairs Stairs: Yes   Stair Management: One rail  Left;Step to pattern;Forwards Number of Stairs: 4 General stair comments: Steady, encouraged full foot placement on step  Wheelchair Mobility    Modified Rankin (Stroke Patients Only)       Balance Overall balance assessment: Needs assistance Sitting-balance support: Feet supported;Bilateral upper extremity supported Sitting balance-Leahy Scale: Good     Standing balance support: Bilateral upper extremity supported Standing balance-Leahy Scale: Fair                              Cognition Arousal/Alertness: Awake/alert Behavior During Therapy: WFL for tasks assessed/performed Overall Cognitive Status: Within Functional Limits for tasks assessed                                        Exercises Total Joint Exercises Ankle Circles/Pumps: AROM;Both;20 reps;Supine Quad Sets: Strengthening;Both;20 reps;Supine Knee Flexion: AROM;Left;10 reps;Seated(3 positions each rep) Goniometric ROM: 5-90    General Comments        Pertinent Vitals/Pain Pain Assessment: 0-10 Pain Score: 10-Worst pain ever(does not present in distress) Pain Location: Left knee Pain Descriptors / Indicators: Constant;Sore Pain Intervention(s): Monitored during session;Premedicated before session    Home Living                      Prior Function            PT Goals (current goals can now be found in the care plan section) Acute Rehab PT Goals Patient Stated Goal: To  return home ASAP Progress towards PT goals: Progressing toward goals    Frequency    BID      PT Plan Current plan remains appropriate    Co-evaluation              AM-PAC PT "6 Clicks" Daily Activity  Outcome Measure  Difficulty turning over in bed (including adjusting bedclothes, sheets and blankets)?: A Little Difficulty moving from lying on back to sitting on the side of the bed? : A Little Difficulty sitting down on and standing up from a chair with arms (e.g., wheelchair,  bedside commode, etc,.)?: A Little Help needed moving to and from a bed to chair (including a wheelchair)?: A Little Help needed walking in hospital room?: A Little Help needed climbing 3-5 steps with a railing? : A Little 6 Click Score: 18    End of Session   Activity Tolerance: Patient limited by pain Patient left: with call bell/phone within reach;in bed(refused alarm)   PT Visit Diagnosis: Difficulty in walking, not elsewhere classified (R26.2);Muscle weakness (generalized) (M62.81);Pain Pain - Right/Left: Left Pain - part of body: Knee     Time: 9163-8466 PT Time Calculation (min) (ACUTE ONLY): 47 min  Charges:  $Gait Training: 23-37 mins $Therapeutic Exercise: 8-22 mins                    G Codes:  Functional Assessment Tool Used: AM-PAC 6 Clicks Basic Mobility     Larae Grooms, PTA 10/15/2017, 3:02 PM

## 2017-10-15 NOTE — Progress Notes (Signed)
Clinical Social Worker (CSW) received SNF consult. PT is recommending outpatient PT. RN case manager aware of above. Please reconsult if future social work needs arise. CSW signing off.   Reshaun Briseno, LCSW [phone]  

## 2017-10-15 NOTE — Progress Notes (Signed)
Patient c/o cryo cuff leaking. NT changed cooler and still was leaking. AD changed wrap around patient's knee.  When taking the dressing down AD noticed blood had saturated through the dressing. Notified Dr. Rudene Christians. MD stated to reinforce dressing and that [name] will be in tomorrow to change dressing.  Dressing was reinforced by 3 ABD's, gauze, and ACE wrap. TED hose placed to left leg.

## 2017-10-15 NOTE — Evaluation (Signed)
Occupational Therapy Evaluation Patient Details Name: Anthony Nguyen MRN: [id number] DOB: [birth year] Today's Date: 10/15/2017    History of Present Illness Pt. is a 61 y.o. male who was admitted to [facility] for Left TKR.   Clinical Impression   Pt. Presents with 8/10 pain, weakness, and limited functional mobility which limit his ability to complete ADL, and IADL tasks. Pt. Resides at home with his girlfriend. Pt. Education was provided about A/E use for LE ADLs.  Pt. Nurse provided pt. with pain medicine. Pt. Was independent with ADLs, IADLs, and was working as a Dealer. Pt. Could benefit from OT services for ADL training, A/E training, and pt. Education about home modification, and DME. Pt. Plans to return home with his girlfriend to assist as needed upon discharge.    Follow Up Recommendations  No OT follow up    Equipment Recommendations       Recommendations for Other Services       Precautions / Restrictions Precautions Precautions: Fall Restrictions Weight Bearing Restrictions: Yes LLE Weight Bearing: Weight bearing as tolerated         Balance Overall balance assessment: Needs assistance Sitting-balance support: Feet supported;Bilateral upper extremity supported Sitting balance-Leahy Scale: Good                                     ADL either performed or assessed with clinical judgement   ADL Overall ADL's : Needs assistance/impaired Eating/Feeding: Set up   Grooming: Set up   Upper Body Bathing: Set up   Lower Body Bathing: Minimal assistance   Upper Body Dressing : Set up   Lower Body Dressing: Minimal assistance               Functional mobility during ADLs: Min guard General ADL Comments: Pt. education was provided about A/E use for LE ADLs.     Vision         Perception     Praxis      Pertinent Vitals/Pain Pain Assessment: 0-10 Pain Score: 10-Worst pain ever(does not present in distress) Pain Location: Left  knee Pain Descriptors / Indicators: Constant;Sore Pain Intervention(s): Monitored during session;Premedicated before session     Hand Dominance Right   Extremity/Trunk Assessment Upper Extremity Assessment Upper Extremity Assessment: Overall WFL for tasks assessed           Communication Communication Communication: No difficulties   Cognition Arousal/Alertness: Awake/alert Behavior During Therapy: WFL for tasks assessed/performed Overall Cognitive Status: Within Functional Limits for tasks assessed                                     General Comments       Exercises   Shoulder Instructions      Home Living Family/patient expects to be discharged to:: Private residence Living Arrangements: Spouse/significant other Available Help at Discharge: Family Type of Home: House Home Access: Stairs to enter Technical brewer of Steps: 3 Entrance Stairs-Rails: Right Home Layout: One level               Home Equipment: None          Prior Functioning/Environment Level of Independence: Independent        Comments: Independent with ADLs, IADLs, driving, workeing full-time as a Dealer.        OT Problem List: Decreased strength;Pain;Decreased range  of motion;Impaired UE functional use      OT Treatment/Interventions: Self-care/ADL training;DME and/or AE instruction;Therapeutic exercise;Patient/family education    OT Goals(Current goals can be found in the care plan section) Acute Rehab OT Goals Patient Stated Goal: To return home ASAP OT Goal Formulation: With patient Potential to Achieve Goals: Good  OT Frequency:     Barriers to D/C:            Co-evaluation              AM-PAC PT "6 Clicks" Daily Activity     Outcome Measure Help from another person eating meals?: None Help from another person taking care of personal grooming?: None Help from another person toileting, which includes using toliet, bedpan, or urinal?:  None Help from another person bathing (including washing, rinsing, drying)?: A Little Help from another person to put on and taking off regular upper body clothing?: None Help from another person to put on and taking off regular lower body clothing?: A Little 6 Click Score: 22   End of Session    Activity Tolerance: Patient tolerated treatment well Patient left: in bed  OT Visit Diagnosis: Muscle weakness (generalized) (M62.81)                Time: 4696-2952 OT Time Calculation (min): 15 min Charges:  OT General Charges $OT Visit: 1 Visit OT Evaluation $OT Eval Low Complexity: 1 Low G-Codes: OT G-codes **NOT FOR INPATIENT CLASS** Functional Assessment Tool Used: Clinical judgement Functional Limitation: Self care Self Care Current Status (W4132): At least 1 percent but less than 20 percent impaired, limited or restricted Self Care Goal Status (G4010): 0 percent impaired, limited or restricted   Harrel Carina, MS, OTR/L   Harrel Carina, MS, OTR/L 10/15/2017, 12:32 PM

## 2017-10-15 NOTE — Progress Notes (Signed)
Physical Therapy Treatment Patient Details Name: Anthony Nguyen MRN: [id number] DOB: 1956-04-14 Today's Date: 10/15/2017    History of Present Illness Pt. is a 61 y.o. male who was admitted to [facility] for Left TKR.    PT Comments    Pt initially not agreeable to PT due to increased pain issues and active bleeding at surgical site yesterday. With education and encouragement, pt agreeable. Reports 10/10 pain in left knee, but pt does not respond with excessive distress. Pt educated on and participates in flexion/extension stretching; educated on carryover for home. Pt requires education on improved quality of transfers and ambulation. Pt receives up in chair. Plan to practice steps this afternoon. Continue PT to progress range, strength, endurance and overall quality/safety of functional mobility to allow for an optimal safe return home.    Follow Up Recommendations  Outpatient PT     Equipment Recommendations  Rolling walker with 5" wheels(received)    Recommendations for Other Services       Precautions / Restrictions Precautions Precautions: Fall Restrictions Weight Bearing Restrictions: Yes LLE Weight Bearing: Weight bearing as tolerated    Mobility  Bed Mobility Overal bed mobility: Modified Independent                Transfers Overall transfer level: Needs assistance Equipment used: Rolling walker (2 wheeled) Transfers: Sit to/from Stand Sit to Stand: Min guard         General transfer comment: Encouraged improved use of LLE with stand and standing QS prior to gait  Ambulation/Gait Ambulation/Gait assistance: Min guard Ambulation Distance (Feet): 35 Feet Assistive device: Rolling walker (2 wheeled) Gait Pattern/deviations: Step-through pattern   Gait velocity interpretation: Below normal speed for age/gender General Gait Details: L knee in flexed position; encouraged QS with stance phase. Also instructed in improved rw placement to avoid stepping too  far into rw   Stairs            Wheelchair Mobility    Modified Rankin (Stroke Patients Only)       Balance Overall balance assessment: Needs assistance Sitting-balance support: Feet supported;Bilateral upper extremity supported Sitting balance-Leahy Scale: Good     Standing balance support: Bilateral upper extremity supported Standing balance-Leahy Scale: Fair                              Cognition Arousal/Alertness: Awake/alert Behavior During Therapy: WFL for tasks assessed/performed Overall Cognitive Status: Within Functional Limits for tasks assessed                                        Exercises Total Joint Exercises Ankle Circles/Pumps: AROM;Both;20 reps;Supine Quad Sets: Strengthening;Both;20 reps;Supine Knee Flexion: AROM;Left;10 reps;Seated(3 positions each rep) Goniometric ROM: 5-90    General Comments        Pertinent Vitals/Pain Pain Assessment: 0-10 Pain Score: 10-Worst pain ever(does not present in distress) Pain Location: Left knee Pain Descriptors / Indicators: Constant;Sore Pain Intervention(s): Monitored during session;Premedicated before session    [place] expects to be discharged to:: Private residence Living Arrangements: Spouse/significant other Available Help at Discharge: Family Type of Home: House Home Access: Stairs to enter Entrance Stairs-Rails: Right Home Layout: One level Home Equipment: None      Prior Function Level of Independence: Independent      Comments: Independent with ADLs, IADLs, driving, workeing full-time as a  Dealer.   PT Goals (current goals can now be found in the care plan section) Acute Rehab PT Goals Patient Stated Goal: To return home ASAP Progress towards PT goals: Progressing toward goals    Frequency    BID      PT Plan Current plan remains appropriate    Co-evaluation              AM-PAC PT "6 Clicks" Daily Activity   Outcome Measure  Difficulty turning over in bed (including adjusting bedclothes, sheets and blankets)?: A Little Difficulty moving from lying on back to sitting on the side of the bed? : A Little Difficulty sitting down on and standing up from a chair with arms (e.g., wheelchair, bedside commode, etc,.)?: Unable Help needed moving to and from a bed to chair (including a wheelchair)?: A Little Help needed walking in hospital room?: A Little Help needed climbing 3-5 steps with a railing? : A Little 6 Click Score: 16    End of Session   Activity Tolerance: Patient limited by pain Patient left: in chair;with call bell/phone within reach;with chair alarm set   PT Visit Diagnosis: Difficulty in walking, not elsewhere classified (R26.2);Muscle weakness (generalized) (M62.81);Pain Pain - Right/Left: Left Pain - part of body: Knee     Time: 1155-1230 PT Time Calculation (min) (ACUTE ONLY): 35 min  Charges:  $Gait Training: 8-22 mins $Therapeutic Exercise: 8-22 mins                    G Codes:  Functional Assessment Tool Used: AM-PAC 6 Clicks Basic Mobility     Larae Grooms, PTA 10/15/2017, 1:05 PM

## 2017-10-16 LAB — CBC
HCT: 38.8 % — ABNORMAL LOW (ref 40.0–52.0)
HEMOGLOBIN: 12.9 g/dL — AB (ref 13.0–18.0)
MCH: 29.8 pg (ref 26.0–34.0)
MCHC: 33.2 g/dL (ref 32.0–36.0)
MCV: 89.9 fL (ref 80.0–100.0)
PLATELETS: 187 10*3/uL (ref 150–440)
RBC: 4.32 MIL/uL — AB (ref 4.40–5.90)
RDW: 13.5 % (ref 11.5–14.5)
WBC: 13.7 10*3/uL — AB (ref 3.8–10.6)

## 2017-10-16 LAB — BASIC METABOLIC PANEL
ANION GAP: 11 (ref 5–15)
BUN: 13 mg/dL (ref 6–20)
CHLORIDE: 98 mmol/L — AB (ref 101–111)
CO2: 24 mmol/L (ref 22–32)
Calcium: 9 mg/dL (ref 8.9–10.3)
Creatinine, Ser: 1.01 mg/dL (ref 0.61–1.24)
GFR calc Af Amer: >60 mL/min (ref >60)
Glucose, Bld: 154 mg/dL — ABNORMAL HIGH (ref 65–99)
Potassium: 4 mmol/L (ref 3.5–5.1)
SODIUM: 133 mmol/L — AB (ref 135–145)

## 2017-10-16 NOTE — Progress Notes (Signed)
Subjective: 2 Days Post-Op Procedure(s) (LRB): TOTAL KNEE ARTHROPLASTY (Left) Patient reports pain as moderate.   Patient is well but has increased drainage to the left knee. Plan is to go Home with HHPT after hospital stay. Negative for chest pain and shortness of breath Fever: no Gastrointestinal:Negative for nausea and vomiting  Objective: Vital signs in last 24 hours: Temp:  [98.4 F (36.9 C)-99.5 F (37.5 C)] 98.4 F (36.9 C) (12/29 0723) Pulse Rate:  [96-113] 107 (12/29 0723) Resp:  [16-18] 18 (12/29 0723) BP: (135-169)/(75-78) 164/75 (12/29 0723) SpO2:  [95 %-99 %] 95 % (12/29 0723)  Intake/Output from previous day:  Intake/Output Summary (Last 24 hours) at 10/16/2017 0922 Last data filed at 10/15/2017 1900 Gross per 24 hour  Intake 260 ml  Output -  Net 260 ml    Intake/Output this shift: No intake/output data recorded.  Labs: Recent Labs    10/14/17 1419 10/15/17 0359 10/16/17 0829  HGB 13.4 12.6* 12.9*   Recent Labs    10/15/17 0359 10/16/17 0829  WBC 14.4* 13.7*  RBC 4.12* 4.32*  HCT 37.5* 38.8*  PLT 187 187   Recent Labs    10/15/17 0359 10/16/17 0829  NA 137 133*  K 4.4 4.0  CL 103 98*  CO2 26 24  BUN 13 13  CREATININE 1.04 1.01  GLUCOSE 183* 154*  CALCIUM 8.4* 9.0   No results for input(s): LABPT, INR in the last 72 hours.   EXAM General - Patient is Alert, Appropriate and Oriented Extremity - Sensation intact distally Intact pulses distally Dorsiflexion/Plantar flexion intact Incision: moderate drainage No cellulitis present Dressing/Incision - blood tinged drainage.  Provena Plus woundvac applied to the left knee today. Motor Function - intact, moving foot and toes well on exam.   Abdomen distended with normal BS.  Positive tympany.  Past Medical History:  Diagnosis Date  . Allergy   . Arthritis    osteoarthritis  . Cancer ([place])    melanoma skin cancer  . Current every day smoker   . Diverticulosis   . GERD  (gastroesophageal reflux disease)   . H/O diverticulitis of colon   . Restless leg syndrome   . Sciatic pain     Assessment/Plan: 2 Days Post-Op Procedure(s) (LRB): TOTAL KNEE ARTHROPLASTY (Left) Active Problems:   Primary osteoarthritis of left knee  Estimated body mass index is 28.25 kg/m as calculated from the following:   Height as of this encounter: 6\' 2"  (1.88 m).   Weight as of this encounter: 99.8 kg (219 lb 16 oz). Up with therapy Discharge home with home health   Moderate bloody drainage to the knee, Hg 12.9 this AM. Able to ambulate 35 feet yesterday. Pt recommending Outpatient PT however due to adding a woundvac for the patient this will make it difficulty for him to leave the house, and cartridge changes will need to be performed, feel that HHPT will be beneficial. Pt has had a BM Plan for discharge home today with HHPT.  DVT Prophylaxis - Lovenox, Foot Pumps and TED hose Weight-Bearing as tolerated to left leg  J. Cameron Proud, PA-C [facility] Orthopaedic Surgery 10/16/2017, 9:22 AM

## 2017-10-16 NOTE — Progress Notes (Signed)
Physical Therapy Treatment Patient Details Name: Anthony Nguyen MRN: [id number] DOB: 09-08-1956 Today's Date: 10/16/2017    History of Present Illness Pt. is a 61 y.o. male who was admitted to [facility] for Left TKR.    PT Comments    Pt initially declined stating he is going home but agrees to ambulate around unit x 1 with walker and min guard.  Some safety deficits noted and were reviewed during session.  Participated in exercises as described below. Limited exercises.  Stated ROM knee seems limited this session due to new wound vac feeling "tight".  Voices no questions or concerns regarding discharge to home.   Follow Up Recommendations  Outpatient PT     Equipment Recommendations  Rolling walker with 5" wheels    Recommendations for Other Services       Precautions / Restrictions Precautions Precautions: Fall Restrictions Weight Bearing Restrictions: Yes LLE Weight Bearing: Weight bearing as tolerated    Mobility  Bed Mobility Overal bed mobility: Modified Independent                Transfers Overall transfer level: Needs assistance Equipment used: Rolling walker (2 wheeled) Transfers: Sit to/from Stand Sit to Stand: Supervision         General transfer comment: reenforced general safety awareness  Ambulation/Gait Ambulation/Gait assistance: Min guard Ambulation Distance (Feet): 200 Feet Assistive device: Rolling walker (2 wheeled) Gait Pattern/deviations: Step-through pattern;Step-to pattern   Gait velocity interpretation: Below normal speed for age/gender General Gait Details: irregular pattern at times but progressed to step through   [employer] Mobility    Modified Rankin (Stroke Patients Only)       Balance Overall balance assessment: Needs assistance Sitting-balance support: Feet supported;Bilateral upper extremity supported Sitting balance-Leahy Scale: Good     Standing balance support: Bilateral upper  extremity supported Standing balance-Leahy Scale: Fair                              Cognition Arousal/Alertness: Awake/alert Behavior During Therapy: WFL for tasks assessed/performed Overall Cognitive Status: Within Functional Limits for tasks assessed                                        Exercises Total Joint Exercises Goniometric ROM: 5-88 - pt felt limited due to new wound vac application "it's tight" Other Exercises Other Exercises: LAQ and knee flexion x 5 AAROM - declined further exercises at this time.    General Comments        Pertinent Vitals/Pain Pain Assessment: 0-10 Pain Score: 6  Pain Location: Left knee Pain Descriptors / Indicators: Constant;Sore Pain Intervention(s): Limited activity within patient's tolerance;Monitored during session;Ice applied    Home Living                      Prior Function            PT Goals (current goals can now be found in the care plan section) Progress towards PT goals: Progressing toward goals    Frequency    BID      PT Plan Current plan remains appropriate    Co-evaluation              AM-PAC PT "6 Clicks" Daily Activity  Outcome Measure  Difficulty  turning over in bed (including adjusting bedclothes, sheets and blankets)?: None Difficulty moving from lying on back to sitting on the side of the bed? : None Difficulty sitting down on and standing up from a chair with arms (e.g., wheelchair, bedside commode, etc,.)?: None Help needed moving to and from a bed to chair (including a wheelchair)?: A Little Help needed walking in hospital room?: A Little Help needed climbing 3-5 steps with a railing? : A Little 6 Click Score: 21    End of Session Equipment Utilized During Treatment: Gait belt Activity Tolerance: Patient tolerated treatment well Patient left: in bed;with call bell/phone within reach Nurse Communication: Other (comment) Pain - Right/Left: Left Pain -  part of body: Knee     Time: 1660-6301 PT Time Calculation (min) (ACUTE ONLY): 17 min  Charges:  $Gait Training: 8-22 mins                    G Codes:       Chesley Noon, PTA 10/16/17, 12:28 PM

## 2017-11-12 ENCOUNTER — Ambulatory Visit: Payer: Self-pay | Admitting: Nurse Practitioner

## 2017-12-02 ENCOUNTER — Ambulatory Visit: Payer: Self-pay | Admitting: Nurse Practitioner

## 2017-12-23 ENCOUNTER — Other Ambulatory Visit: Payer: Self-pay

## 2017-12-23 ENCOUNTER — Encounter: Payer: Self-pay | Admitting: Nurse Practitioner

## 2017-12-23 ENCOUNTER — Ambulatory Visit: Payer: BLUE CROSS/BLUE SHIELD | Admitting: Nurse Practitioner

## 2017-12-23 VITALS — BP 138/81 | HR 74 | Temp 98.2°F | Ht 73.0 in | Wt 218.0 lb

## 2017-12-23 DIAGNOSIS — G8929 Other chronic pain: Secondary | ICD-10-CM

## 2017-12-23 DIAGNOSIS — F3289 Other specified depressive episodes: Secondary | ICD-10-CM

## 2017-12-23 DIAGNOSIS — M25562 Pain in left knee: Secondary | ICD-10-CM

## 2017-12-23 DIAGNOSIS — J3089 Other allergic rhinitis: Secondary | ICD-10-CM | POA: Diagnosis not present

## 2017-12-23 DIAGNOSIS — Z7689 Persons encountering health services in other specified circumstances: Secondary | ICD-10-CM | POA: Diagnosis not present

## 2017-12-23 MED ORDER — CETIRIZINE HCL 10 MG PO TABS: 10.0000 mg | ORAL_TABLET | Freq: Every day | ORAL | 11 refills | Status: DC

## 2017-12-23 MED ORDER — DICLOFENAC SODIUM 3 % TD GEL: 1.0000 "application " | Freq: Three times a day (TID) | TRANSDERMAL | 2 refills | Status: DC | PRN

## 2017-12-23 MED ORDER — FLUTICASONE PROPIONATE 50 MCG/ACT NA SUSP: 2.0000 | Freq: Every day | NASAL | 11 refills | Status: DC

## 2017-12-23 MED ORDER — ESCITALOPRAM OXALATE 10 MG PO TABS: 10.0000 mg | ORAL_TABLET | Freq: Every day | ORAL | 3 refills | Status: DC

## 2017-12-23 NOTE — Progress Notes (Signed)
Subjective:    Patient ID: Anthony Nguyen, male    DOB: 05-29-1956, 62 y.o.   MRN: [id number]  Anthony Nguyen is a 62 y.o. male presenting on 12/23/2017 for Establish Care; Headache; and Depression   HPI Establish Care New Provider Pt last seen by PCP many years ago.  Obtain records from care everywhere for recent pain management and orthopedic specialists.    Depression: Patient has had recent left knee replacement surgery with complicated course of recovery.  He has become very depressed with significant anhedonia.  Most anhedonia is related to no longer having a job and feeling like he can contribute meaningfully to daily work.  Significant length of time with rainy weather has also impacted Anthony Nguyen's moods.  His fiance, Anthony Nguyen, has encouraged him to seek care and is accompanying him today.  He notes increased time sleeping, decreased appetite, anhedonia her biggest current symptoms.  He does leave home to participate in physical therapy 2 times a week.  Headaches:  Patient reports recent onset of new headaches that are also associated with symptoms of: Sinuses - sinus pressure, congestion.  Nasal congestion.  Frequent sneezing.  He denies cough, itchy/watery eyes, fever, chills, sweats, sore throat. - Eyeglasses: has had last eye exam 2016.  Patient also reports he feels his prescription has changed since that time.   Past Medical History:  Diagnosis Date  . Allergy   . Arthritis    osteoarthritis  . Cancer ([place])    melanoma skin cancer face  . Current every day smoker   . Diverticulosis   . GERD (gastroesophageal reflux disease)   . H/O diverticulitis of colon   . Restless leg syndrome   . Sciatic pain    Past Surgical History:  Procedure Laterality Date  . JOINT REPLACEMENT Left 10/14/2018   knee  . skin cancer removal     face  . TONSILLECTOMY AND ADENOIDECTOMY    . TOTAL KNEE ARTHROPLASTY Left 10/14/2017   Procedure: TOTAL KNEE ARTHROPLASTY;  Surgeon:  Hessie Knows, MD;  Location: [facility] ORS;  Service: Orthopedics;  Laterality: Left;   Social History   Socioeconomic History  . Marital status: Single    Spouse name: Not on file  . Number of children: 4  . Years of education: 8  . Highest education level: Not on file  Occupational History  . Occupation: Ship broker  Social Needs  . Financial resource strain: Not hard at all  . Food insecurity:    Worry: Never true    Inability: Never true  . Transportation needs:    Medical: No    Non-medical: No  Tobacco Use  . Smoking status: Current Every Day Smoker    Packs/day: 1.00    Years: 30.00    Pack years: 30.00    Types: Cigarettes  . Smokeless tobacco: Never Used  Substance and Sexual Activity  . Alcohol use: No    Frequency: Never  . Drug use: No  . Sexual activity: Never  Lifestyle  . Physical activity:    Days per week: 0 days    Minutes per session: 0 min  . Stress: Rather much  Relationships  . Social connections:    Talks on phone: More than three times a week    Gets together: Once a week    Attends religious service: Never    Active member of club or organization: No    Attends meetings of clubs or organizations: Never    Relationship status:  Divorced  . Intimate partner violence:    Fear of current or ex partner: No    Emotionally abused: No    Physically abused: No    Forced sexual activity: No  Other Topics Concern  . Not on file  Social History Narrative   Anthony Nguyen grew up in [place]. He is divorced and has 4 sons. He has 4 grandsons and 1 granddaughter. She works for [employer]. 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 He also enjoys playing golf on the weekends with his sons.   Family History  Problem Relation Age of Onset  . Hypertension Mother   . Alzheimer's disease Father   . Cancer Sister 13       breast cancer  . Diabetes Sister   . Heart disease Brother        electrical pathways - ongoing workup  .  Healthy Brother   . Healthy Son   . Healthy Son   . Healthy Son   . Healthy Son    Current Outpatient Medications on File Prior to Visit  Medication Sig  . Melatonin 1 MG TABS Take 1 tablet by mouth at bedtime as needed.   No current facility-administered medications on file prior to visit.     Review of Systems  Constitutional: Negative.   HENT: Positive for congestion, sinus pressure and sneezing.   Eyes: Negative.   Respiratory: Negative.   Cardiovascular: Negative.   Gastrointestinal: Negative.   Endocrine: Negative.   Genitourinary: Negative.   Musculoskeletal: Positive for arthralgias (Left knee weakness).  Skin: Negative.   Allergic/Immunologic: Negative.   Neurological: Positive for headaches.  Hematological: Negative.   Psychiatric/Behavioral: Positive for dysphoric mood and sleep disturbance. Negative for suicidal ideas.   Per HPI unless specifically indicated above      Objective:    BP 138/81 (BP Location: Right Arm, Patient Position: Sitting, Cuff Size: Normal)   Pulse 74   Temp 98.2 F (36.8 C)   Ht 6\' 1"  (1.854 m)   Wt 218 lb (98.9 kg)   BMI 28.76 kg/m   Wt Readings from Last 3 Encounters:  01/05/18 228 lb (103.4 kg)  12/28/17 230 lb (104.3 kg)  12/23/17 218 lb (98.9 kg)    Physical Exam  Constitutional: He is oriented to person, place, and time. He appears well-developed and well-nourished. No distress.  Neck: Normal range of motion. Neck supple. Carotid bruit is not present.  Cardiovascular: Normal rate, regular rhythm, S1 normal, S2 normal, normal heart sounds and intact distal pulses.  Pulmonary/Chest: Effort normal and breath sounds normal. No respiratory distress.  Musculoskeletal: He exhibits no edema (pedal).  Bilateral Knees Inspection: Normal appearance and symmetrical. No ecchymosis or effusion. Palpation: Non-tender. No crepitus ROM: Full active ROM bilaterally Special Testing: Lachman / Valgus/Varus tests negative with intact  ligaments (ACL, MCL, LCL). McMurray negative without meniscus symptoms. Strength: 5/5 intact knee flex/ext, ankle dorsi/plantarflex Neurovascular: distally intact sensation light touch and pulses   Neurological: He is alert and oriented to person, place, and time.  Skin: Skin is warm and dry.  Psychiatric: His speech is normal and behavior is normal. Judgment and thought content normal. Cognition and memory are normal. He exhibits a depressed mood. He expresses no homicidal and no suicidal ideation. He expresses no suicidal plans and no homicidal plans.  Vitals reviewed.     Results for orders placed or performed during the hospital encounter of 10/14/17  CBC  Result Value Ref Range  WBC 12.5 (H) 3.8 - 10.6 K/uL   RBC 4.43 4.40 - 5.90 MIL/uL   Hemoglobin 13.4 13.0 - 18.0 g/dL   HCT 40.7 40.0 - 52.0 %   MCV 92.0 80.0 - 100.0 fL   MCH 30.3 26.0 - 34.0 pg   MCHC 32.9 32.0 - 36.0 g/dL   RDW 13.4 11.5 - 14.5 %   Platelets 185 150 - 440 K/uL  Creatinine, serum  Result Value Ref Range   Creatinine, Ser 1.12 0.61 - 1.24 mg/dL   GFR calc non Af Amer >60 >60 mL/min   GFR calc Af Amer >60 >60 mL/min  CBC  Result Value Ref Range   WBC 14.4 (H) 3.8 - 10.6 K/uL   RBC 4.12 (L) 4.40 - 5.90 MIL/uL   Hemoglobin 12.6 (L) 13.0 - 18.0 g/dL   HCT 37.5 (L) 40.0 - 52.0 %   MCV 91.1 80.0 - 100.0 fL   MCH 30.6 26.0 - 34.0 pg   MCHC 33.5 32.0 - 36.0 g/dL   RDW 13.5 11.5 - 14.5 %   Platelets 187 150 - 440 K/uL  Basic metabolic panel  Result Value Ref Range   Sodium 137 135 - 145 mmol/L   Potassium 4.4 3.5 - 5.1 mmol/L   Chloride 103 101 - 111 mmol/L   CO2 26 22 - 32 mmol/L   Glucose, Bld 183 (H) 65 - 99 mg/dL   BUN 13 6 - 20 mg/dL   Creatinine, Ser 1.04 0.61 - 1.24 mg/dL   Calcium 8.4 (L) 8.9 - 10.3 mg/dL   GFR calc non Af Amer >60 >60 mL/min   GFR calc Af Amer >60 >60 mL/min   Anion gap 8 5 - 15  CBC  Result Value Ref Range   WBC 13.7 (H) 3.8 - 10.6 K/uL   RBC 4.32 (L) 4.40 - 5.90 MIL/uL     Hemoglobin 12.9 (L) 13.0 - 18.0 g/dL   HCT 38.8 (L) 40.0 - 52.0 %   MCV 89.9 80.0 - 100.0 fL   MCH 29.8 26.0 - 34.0 pg   MCHC 33.2 32.0 - 36.0 g/dL   RDW 13.5 11.5 - 14.5 %   Platelets 187 150 - 440 K/uL  Basic metabolic panel  Result Value Ref Range   Sodium 133 (L) 135 - 145 mmol/L   Potassium 4.0 3.5 - 5.1 mmol/L   Chloride 98 (L) 101 - 111 mmol/L   CO2 24 22 - 32 mmol/L   Glucose, Bld 154 (H) 65 - 99 mg/dL   BUN 13 6 - 20 mg/dL   Creatinine, Ser 1.01 0.61 - 1.24 mg/dL   Calcium 9.0 8.9 - 10.3 mg/dL   GFR calc non Af Amer >60 >60 mL/min   GFR calc Af Amer >60 >60 mL/min   Anion gap 11 5 - 15  Type and screen Type/Screen expires 10/13/17. [place]  Result Value Ref Range   ABO/RH(D) O POS    Antibody Screen NEG    Sample Expiration      10/17/2017 Performed at [facility], [address] [place], [place] [address]       Assessment & Plan:   Problem List Items Addressed This Visit    None    Visit Diagnoses    Chronic pain of left knee    -  Primary Patient with chronic pain of left knee status post total left knee replacement.  Patient continues with physical therapy as prescribed by orthopedic surgeon.  Continues  to feel weakness of the left knee and is beginning to have doubts about full recovery, which is contributing to depression.  Plan: 1.  Continue pain management as prescribed by orthopedic surgeon 2.  Start using diclofenac gel topically for pain relief. 3.  Continue physical therapy as prescribed.  Consider change of physical therapy location if unable to get number of prescribed therapy sessions per week. 4.  Follow-up as needed and with orthopedic surgeon as scheduled.   Relevant Medications   Diclofenac Sodium 3 % GEL   Non-seasonal allergic rhinitis, unspecified trigger Consistent with chronic allergic rhinitis, non-seasonal, unknown triggers. - Today no evidence of acute sinusitis or complication.  - Has not  recently used loratadine, cetirizine, flonase  Plan: 1. Start nasal fluticasone 2 sprays each nostril once daily for 4 weeks. 2. START antihistamine cetirizine 10 mg once daily. 3. Follow-up in future, as needed, consider 2nd opinion from ENT/allergy.       Relevant Medications   fluticasone (FLONASE) 50 MCG/ACT nasal spray   cetirizine (ZYRTEC) 10 MG tablet   Other depression     Acute to subacute onset of depression worsened by inability to continue meaningful work.  Anhedonia is most significant symptom for patient.  Plan: 1.  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 2.  Start ecitalopram 10 mg once daily.  Start with half tablet once daily for 7 days.  Then, increase to 1 full tablet once daily and continue 3.  Follow-up 6 weeks    Encounter to establish care     Previous PCP was many years ago.  Records will not be requested.  [facility] today.t records   Past medical, family, and surgical history reviewed w/ pt.        Meds ordered this encounter  Medications  . escitalopram (LEXAPRO) 10 MG tablet    Sig: Take 1 tablet (10 mg total) by mouth daily.    Dispense:  30 tablet    Refill:  3    Order Specific Question:   Supervising Provider    Answer:   Olin Hauser [[id number]]  . fluticasone (FLONASE) 50 MCG/ACT nasal spray    Sig: Place 2 sprays into both nostrils daily.    Dispense:  16 g    Refill:  11    Order Specific Question:   Supervising Provider    Answer:   Olin Hauser [[id number]]  . cetirizine (ZYRTEC) 10 MG tablet    Sig: Take 1 tablet (10 mg total) by mouth daily.    Dispense:  30 tablet    Refill:  11    Order Specific Question:   Supervising Provider    Answer:   Olin Hauser [[id number]]  . Diclofenac Sodium 3 % GEL    Sig: Place 1 application onto the skin 3 (three) times daily as needed (left knee pain).    Dispense:  1 Tube    Refill:  2    Order Specific Question:   Supervising Provider    Answer:    Olin Hauser [[id number]]      Follow up plan: Return in about 6 weeks (around 02/03/2018) for depression.  Cassell Smiles, DNP, AGPCNP-BC Adult Gerontology Primary Care Nurse Practitioner [facility] 01/15/2018, 2:42 PM

## 2017-12-23 NOTE — Patient Instructions (Addendum)
Anthony Nguyen, Thank you for coming in to clinic today.  1. Start taking Tylenol extra strength 1 to 2 tablets every 6-8 hours for aches or fever/chills for next few days as needed.  Do not take more than 3,000 mg in 24 hours from all medicines.  May take Ibuprofen as well if tolerated 200-400mg  every 8 hours as needed. May alternate tylenol and ibuprofen in same day. - Use heat and ice.  Apply this for 15 minutes at a time 6-8 times per day.   - Muscle rub with lidocaine, lidocaine patch, Biofreeze, or tiger balm for topical pain relief.  Avoid using this with heat and ice to avoid burns.  2. START escitalopram 10 mg tablet.  Take 1/2 tablet once daily for 7 days.  Then, increase to 1 full tablet once daily and continue.   3. For your allergies: - START flonase 2 sprays once daily - START cetirizine 10 mg once daily.  Please schedule a follow-up appointment with Anthony Nguyen, AGNP. Return in about 6 weeks (around 02/03/2018) for depression.  If you have any other questions or concerns, please feel free to call the clinic or send a message through [facility]. You may also schedule an earlier appointment if necessary.  You will receive a survey after today's visit either digitally by e-mail or paper by [employer]. Your experiences and feedback matter to [place].  Please respond so we know how we are doing as we provide care for you.   Anthony Smiles, DNP, AGNP-BC Adult Gerontology Nurse Practitioner [facility], [facility]    Knee Rehabilitation Guidelines Following Surgery After knee surgery, it is important to follow instructions from your health care provider about range-of-motion (ROM) and muscle strengthening exercises. This will improve your surgery results. If the exercises cause you to have pain or swelling in your knee joint, do them less often until you can do them without pain. Then, slowly increase how often you do your exercises. If you have problems or questions, talk with your  health care provider or physical therapist. You should start exercising as soon as your health care provider or physical therapist says it is okay. Follow these instructions at home: Activity  Use your crutches orwalker as told by your health care provider.  Do not lift anything that is heavier than 10 lb (4.5 kg) and do not play contact sports until your health care provider says it is okay.  Return to your normal activities as told by your health care provider. Ask your health care provider what activities are safe for you.  Return to work as told by your health care provider.  Do not drive a car for six weeks or as told by your health care provider. General instructions  Take over-the-counter and prescription medicines only as told by your health care provider.  Protect your knee during the recovery period to keep it from getting injured again.  You may take sponge baths. Do not take showers or tub baths until your health care provider says it is okay.  Remove throw rugs and tripping hazards from the floor.  Wear elastic stockings for as long as your health care provider instructs you to.  Keep all follow-up visits as told by your health care provider. This is important. Range of motion and strengthening exercises Do your exercises as told by your health care provider or physical therapist. Before you exercise  Put a towel between your thigh and a heat pack or heating pad.  Leave  the heat on your thigh muscle for 20-30 minutes before you exercise. Leg lifts While your knee is still in a splint or a cast, you can do straight-leg raises. Repeat this exercise 10-20 times, 2-3 times per day. As your knee gets better, do this exercise against resistance. 1. Lie flat on your back. 2. Lift the leg about 6 inches. Keep it raised for 3 seconds. 3. Slowly lower the leg.  Quad sets Repeat this exercise 10-20 times every hour. 1. Lie flat on your back. 2. Tighten your thigh muscle  (quad). 3. Keep the muscle tight for 5-10 seconds.  Hamstring sets Repeat this exercise 10-20 times every hour. 1. Push your foot backward against an object that does not move. 2. Keep pushing your foot against it for 5-10 seconds.  Weight-resistance exercises Weight-resistance exercises are another important part of rehabilitation. These exercises strengthen your muscles by making them work against resistance. Examples include using:  Free weights.  Weight-lifting machines.  Resistance bands.  Aerobic exercises Aerobic exercise keeps joints and muscles moving. It involves large muscle groups. It is also rhythmic in nature and is done for a longer period. Doing these exercises improves circulation and endurance. Your health care provider may have you start by taking a 20-30 minute walk, 2 times per day. Examples of aerobic exercise include:  Swimming.  Walking.  Hiking.  Jogging.  Cross-country skiing.  Bike riding.  This information is not intended to replace advice given to you by your health care provider. Make sure you discuss any questions you have with your health care provider. Document Released: 10/05/2005 Document Revised: 06/09/2016 Document Reviewed: 10/01/2014 Elsevier Interactive Patient Education  [employer].

## 2017-12-28 ENCOUNTER — Encounter: Payer: Self-pay | Admitting: Student in an Organized Health Care Education/Training Program

## 2017-12-28 ENCOUNTER — Other Ambulatory Visit: Payer: Self-pay

## 2017-12-28 ENCOUNTER — Ambulatory Visit
Payer: BLUE CROSS/BLUE SHIELD | Attending: Student in an Organized Health Care Education/Training Program | Admitting: Student in an Organized Health Care Education/Training Program

## 2017-12-28 VITALS — BP 129/77 | HR 82 | Temp 98.5°F | Resp 18 | Ht 73.0 in | Wt 230.0 lb

## 2017-12-28 DIAGNOSIS — M5136 Other intervertebral disc degeneration, lumbar region: Secondary | ICD-10-CM | POA: Diagnosis not present

## 2017-12-28 DIAGNOSIS — M170 Bilateral primary osteoarthritis of knee: Secondary | ICD-10-CM

## 2017-12-28 DIAGNOSIS — M5116 Intervertebral disc disorders with radiculopathy, lumbar region: Secondary | ICD-10-CM | POA: Insufficient documentation

## 2017-12-28 DIAGNOSIS — Z96652 Presence of left artificial knee joint: Secondary | ICD-10-CM

## 2017-12-28 DIAGNOSIS — M48061 Spinal stenosis, lumbar region without neurogenic claudication: Secondary | ICD-10-CM | POA: Insufficient documentation

## 2017-12-28 DIAGNOSIS — M25562 Pain in left knee: Secondary | ICD-10-CM | POA: Insufficient documentation

## 2017-12-28 DIAGNOSIS — G894 Chronic pain syndrome: Secondary | ICD-10-CM | POA: Insufficient documentation

## 2017-12-28 DIAGNOSIS — M25561 Pain in right knee: Secondary | ICD-10-CM | POA: Insufficient documentation

## 2017-12-28 DIAGNOSIS — M545 Low back pain: Secondary | ICD-10-CM | POA: Diagnosis not present

## 2017-12-28 DIAGNOSIS — Z813 Family history of other psychoactive substance abuse and dependence: Secondary | ICD-10-CM | POA: Insufficient documentation

## 2017-12-28 DIAGNOSIS — M5416 Radiculopathy, lumbar region: Secondary | ICD-10-CM | POA: Diagnosis not present

## 2017-12-28 DIAGNOSIS — M9983 Other biomechanical lesions of lumbar region: Secondary | ICD-10-CM | POA: Diagnosis not present

## 2017-12-28 DIAGNOSIS — K219 Gastro-esophageal reflux disease without esophagitis: Secondary | ICD-10-CM | POA: Insufficient documentation

## 2017-12-28 DIAGNOSIS — Z87828 Personal history of other (healed) physical injury and trauma: Secondary | ICD-10-CM | POA: Insufficient documentation

## 2017-12-28 DIAGNOSIS — Z79899 Other long term (current) drug therapy: Secondary | ICD-10-CM | POA: Diagnosis not present

## 2017-12-28 MED ORDER — GABAPENTIN 300 MG PO CAPS: ORAL_CAPSULE | ORAL | 1 refills | Status: DC

## 2017-12-28 NOTE — Patient Instructions (Signed)
Epidural Steroid Injection  An epidural steroid injection is a shot of steroid medicine and numbing medicine that is given into the space between the spinal cord and the bones in your back (epidural space). The shot helps relieve pain caused by an irritated or swollen nerve root.  The amount of pain relief you get from the injection depends on what is causing the nerve to be swollen and irritated, and how long your pain lasts. You are more likely to benefit from this injection if your pain is strong and comes on suddenly rather than if you have had pain for a long time.  Tell a health care provider about:  · Any allergies you have.  · All medicines you are taking, including vitamins, herbs, eye drops, creams, and over-the-counter medicines.  · Any problems you or family members have had with anesthetic medicines.  · Any blood disorders you have.  · Any surgeries you have had.  · Any medical conditions you have.  · Whether you are pregnant or may be pregnant.  What are the risks?  Generally, this is a safe procedure. However, problems may occur, including:  · Headache.  · Bleeding.  · Infection.  · Allergic reaction to medicines.  · Damage to your nerves.    What happens before the procedure?  Staying hydrated  Follow instructions from your health care provider about hydration, which may include:  · Up to 2 hours before the procedure - you may continue to drink clear liquids, such as water, clear fruit juice, black coffee, and plain tea.    Eating and drinking restrictions  Follow instructions from your health care provider about eating and drinking, which may include:  · 8 hours before the procedure - stop eating heavy meals or foods such as meat, fried foods, or fatty foods.  · 6 hours before the procedure - stop eating light meals or foods, such as toast or cereal.  · 6 hours before the procedure - stop drinking milk or drinks that contain milk.  · 2 hours before the procedure - stop drinking clear  liquids.    Medicine  · You may be given medicines to lower anxiety.  · Ask your health care provider about:  ? Changing or stopping your regular medicines. This is especially important if you are taking diabetes medicines or blood thinners.  ? Taking medicines such as aspirin and ibuprofen. These medicines can thin your blood. Do not take these medicines before your procedure if your health care provider instructs you not to.  General instructions  · Plan to have someone take you home from the hospital or clinic.  What happens during the procedure?  · You may receive a medicine to help you relax (sedative).  · You will be asked to lie on your abdomen.  · The injection site will be cleaned.  · A numbing medicine (local anesthetic) will be used to numb the injection site.  · A needle will be inserted through your skin into the epidural space. You may feel some discomfort when this happens. An X-ray machine will be used to make sure the needle is put as close as possible to the affected nerve.  · A steroid medicine and a local anesthetic will be injected into the epidural space.  · The needle will be removed.  · A bandage (dressing) will be put over the injection site.  What happens after the procedure?  · Your blood pressure, heart rate, breathing rate, and blood   oxygen level will be monitored until the medicines you were given have worn off.  · Your arm or leg may feel weak or numb for a few hours.  · The injection site may feel sore.  · Do not drive for 24 hours if you received a sedative.  This information is not intended to replace advice given to you by your health care provider. Make sure you discuss any questions you have with your health care provider.  Document Released: 01/12/2008 Document Revised: 03/18/2016 Document Reviewed: 01/21/2016  Elsevier Interactive Patient Education © 2018 Elsevier Inc.

## 2017-12-28 NOTE — Progress Notes (Signed)
Safety precautions to be maintained throughout the outpatient stay will include: orient to surroundings, keep bed in low position, maintain call bell within reach at all times, provide assistance with transfer out of bed and ambulation.  

## 2017-12-28 NOTE — Progress Notes (Signed)
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 contractures.  Skin & Extremity Inspection: Skin color, temperature, and hair growth are WNL. No peripheral edema or cyanosis. No masses, redness, swelling, asymmetry, or associated skin lesions. No contractures.  Functional ROM: Unrestricted ROM          Functional ROM: Unrestricted ROM          Muscle Tone/Strength: Functionally intact. No obvious neuro-muscular anomalies detected.  Muscle Tone/Strength:  Functionally intact. No obvious neuro-muscular anomalies detected.  Sensory (Neurological): Unimpaired          Sensory (Neurological): Unimpaired          Palpation: No palpable anomalies              Palpation: No palpable anomalies              Specialized Test(s): Deferred         Specialized Test(s): Deferred          Thoracic Spine Area Exam  Skin & Axial Inspection: No masses, redness, or swelling Alignment:  Symmetrical Functional ROM: Unrestricted ROM Stability: No instability detected Muscle Tone/Strength: Functionally intact. No obvious neuro-muscular anomalies detected. Sensory (Neurological): Unimpaired Muscle strength & Tone: No palpable anomalies  Lumbar Spine Area Exam  Skin & Axial Inspection: No masses, redness, or swelling Alignment: Symmetrical Functional ROM: Decreased ROM, bilaterally Stability: No instability detected Muscle Tone/Strength: Functionally intact. No obvious neuro-muscular anomalies detected. Sensory (Neurological): Dermatomal pain pattern Palpation: Complains of area being tender to palpation       Provocative Tests: Lumbar Hyperextension and rotation test: Negative bilaterally for facet joint pain. Lumbar Lateral bending test: Positive ipsilateral radicular pain, bilaterally. Positive for bilateral foraminal stenosis. Patrick's Maneuver: Positive for bilateral S-I arthralgia              Gait & Posture Assessment  Ambulation: Unassisted Gait: Relatively normal for age and body habitus Posture: WNL   Lower Extremity Exam    Side: Right lower extremity  Side: Left lower extremity  Skin & Extremity Inspection: Skin color, temperature, and hair growth are WNL. No peripheral edema or cyanosis. No masses, redness, swelling, asymmetry, or associated skin lesions. No contractures.  Skin & Extremity Inspection: Evidence of prior arthroplastic surgery, edema present  Functional ROM: Unrestricted ROM          Functional ROM: Decreased ROM for knee joint  Muscle Tone/Strength: Functionally intact. No obvious neuro-muscular anomalies detected.  Muscle Tone/Strength: Functionally intact. No obvious neuro-muscular anomalies detected.  Sensory (Neurological): Unimpaired  Sensory (Neurological): Arthropathic arthralgia  Palpation: No palpable anomalies  Palpation: No palpable anomalies   Assessment  Primary Diagnosis & Pertinent Problem List: The primary encounter  diagnosis was Lumbar radiculopathy. Diagnoses of Lumbar degenerative disc disease, Lumbar foraminal stenosis, History of left knee replacement, Primary osteoarthritis of both knees, and Chronic pain syndrome were also pertinent to this visit.  Visit Diagnosis (New problems to examiner): 1. Lumbar radiculopathy   2. Lumbar degenerative disc disease   3. Lumbar foraminal stenosis   4. History of left knee replacement   5. Primary osteoarthritis of both knees   6. Chronic pain syndrome    62 year old male who presents with a history of left knee pain, axial low back pain secondary to left knee osteoarthritis status post left knee replacement surgery; lumbar degenerative disc disease, lumbar radiculopathy.  Patient was previously seeing Dr. Sharlet Salina  for his lumbar radiculopathy and was receiving transforaminal epidural steroid injections bilaterally at L5-S1 with moderate pain relief.  Patient's current medication regimen includes Lexapro 10 mg for his depression, Voltaren gel, melatonin for sleep.  Patient has tried various opioid analgesics and not analgesics without significant benefit.  To be considered for opioid therapy, patient will complete urine drug screen today and I will also send the patient for pain psychology evaluation.  For the patient's lumbar radiculopathy, I discussed lumbar epidural steroid injections that he has previously had with Dr. Sharlet Salina and patient would  like to proceed.  Patient was informed that if he is being considered for chronic opioid therapy here that he will also have his interventional procedures performed here.  Patient endorsed understanding.  Plan: -UDS today -Referral to pain psychology -Lumbar epidural steroid injection at L5-S1 -Gabapentin 300 mg nightly for 2 weeks and 300 mg twice daily.  Patient does not recall taking this medication.  Note: Please be advised that as per protocol, today's visit has been an evaluation only. We have not taken over the  patient's controlled substance management.  Ordered Lab-work, Procedure(s), Referral(s), & Consult(s): Orders Placed This Encounter  Procedures  . Lumbar Epidural Injection  . Compliance Drug Analysis, Ur  . Ambulatory referral to Psychology   Pharmacotherapy (current): Medications ordered:  Meds ordered this encounter  Medications  . gabapentin (NEURONTIN) 300 MG capsule    Sig: 300 mg qhs for 2 weeks and then 300 mg BID if no side effects    Dispense:  60 capsule    Refill:  1    Do not place this medication, or any other prescription from our practice, on "Automatic Refill". Patient may have prescription filled one day early if pharmacy is closed on scheduled refill date.   Medications administered during this visit: Ash L. Watson had no medications administered during this visit.   Pharmacological management options:  Opioid Analgesics: The patient was informed that there is no guarantee that he would be a candidate for opioid analgesics. The decision will be made following CDC guidelines. This decision will be based on the results of diagnostic studies, as well as Mr. Ghrist's risk profile.   Membrane stabilizer: To be determined at a later time  Muscle relaxant: To be determined at a later time  NSAID: To be determined at a later time  Other analgesic(s): To be determined at a later time   Interventional management options: Mr. J[place]lin was informed that there is no guarantee that he would be a candidate for interventional therapies. The decision will be based on the results of diagnostic studies, as well as Mr. Brandenberger's risk profile.  Procedure(s) under consideration:  -Lumbar epidural steroid injection -L5-S1 facet medial branch nerve block -Bilateral genicular nerve block   Provider-requested follow-up: Return in about 2 weeks (around 01/11/2018).  Future Appointments  Date Time Provider [place]  01/12/2018  1:45 PM [name], MD [facility]  None  02/03/2018  3:00 PM Mikey College, NP [facility] None    Primary Care Physician: Mikey College, NP Location: [facility] Note by: [name], M.D, Date: 12/28/2017; Time: 8:44 AM  Patient Instructions  Epidural Steroid Injection An epidural steroid injection is a shot of steroid medicine and numbing medicine that is given into the space between the spinal cord and the bones in your back (epidural space). The shot helps relieve pain caused by an irritated or swollen nerve root. The amount of pain relief you get from the injection depends on what is causing the nerve to be swollen and irritated, and how long your pain lasts. You are more likely to benefit from this injection if your pain is strong and comes on suddenly rather than if you have had pain for a long time. Tell a health care provider about:  Any allergies you have.  All medicines you are taking, including vitamins, herbs, eye drops, creams, and over-the-counter medicines.  Any problems you or family members have had with anesthetic medicines.  Any blood disorders you have.  Any  surgeries you have had.  Any medical conditions you have.  Whether you are pregnant or may be pregnant. What are the risks? Generally, this is a safe procedure. However, problems may occur, including:  Headache.  Bleeding.  Infection.  Allergic reaction to medicines.  Damage to your nerves.  What happens before the procedure? Staying hydrated Follow instructions from your health care provider about hydration, which may include:  Up to 2 hours before the procedure - you may continue to drink clear liquids, such as water, clear fruit juice, black coffee, and plain tea.  Eating and drinking restrictions Follow instructions from your health care provider about eating and drinking, which may include:  8 hours before the procedure - stop eating heavy meals or foods such as meat, fried  foods, or fatty foods.  6 hours before the procedure - stop eating light meals or foods, such as toast or cereal.  6 hours before the procedure - stop drinking milk or drinks that contain milk.  2 hours before the procedure - stop drinking clear liquids.  Medicine  You may be given medicines to lower anxiety.  Ask your health care provider about: ? Changing or stopping your regular medicines. This is especially important if you are taking diabetes medicines or blood thinners. ? Taking medicines such as aspirin and ibuprofen. These medicines can thin your blood. Do not take these medicines before your procedure if your health care provider instructs you not to. General instructions  Plan to have someone take you home from the hospital or clinic. What happens during the procedure?  You may receive a medicine to help you relax (sedative).  You will be asked to lie on your abdomen.  The injection site will be cleaned.  A numbing medicine (local anesthetic) will be used to numb the injection site.  A needle will be inserted through your skin into the epidural space. You may feel some discomfort when this happens. An X-ray machine will be used to make sure the needle is put as close as possible to the affected nerve.  A steroid medicine and a local anesthetic will be injected into the epidural space.  The needle will be removed.  A bandage (dressing) will be put over the injection site. What happens after the procedure?  Your blood pressure, heart rate, breathing rate, and blood oxygen level will be monitored until the medicines you were given have worn off.  Your arm or leg may feel weak or numb for a few hours.  The injection site may feel sore.  Do not drive for 24 hours if you received a sedative. This information is not intended to replace advice given to you by your health care provider. Make sure you discuss any questions you have with your health care provider. Document  Released: 01/12/2008 Document Revised: 03/18/2016 Document Reviewed: 01/21/2016 Elsevier Interactive Patient Education  [employer].

## 2017-12-29 DIAGNOSIS — M5136 Other intervertebral disc degeneration, lumbar region: Secondary | ICD-10-CM | POA: Insufficient documentation

## 2017-12-29 DIAGNOSIS — G894 Chronic pain syndrome: Secondary | ICD-10-CM | POA: Insufficient documentation

## 2017-12-29 DIAGNOSIS — M5416 Radiculopathy, lumbar region: Secondary | ICD-10-CM | POA: Insufficient documentation

## 2017-12-29 DIAGNOSIS — M170 Bilateral primary osteoarthritis of knee: Secondary | ICD-10-CM | POA: Insufficient documentation

## 2017-12-29 DIAGNOSIS — M48061 Spinal stenosis, lumbar region without neurogenic claudication: Secondary | ICD-10-CM | POA: Insufficient documentation

## 2017-12-29 DIAGNOSIS — Z96652 Presence of left artificial knee joint: Secondary | ICD-10-CM | POA: Insufficient documentation

## 2018-01-01 LAB — COMPLIANCE DRUG ANALYSIS, UR

## 2018-01-03 ENCOUNTER — Telehealth: Payer: Self-pay | Admitting: Student in an Organized Health Care Education/Training Program

## 2018-01-03 NOTE — Telephone Encounter (Signed)
Patient is on last pill and next available appt he is scheduled for is 01-12-18

## 2018-01-03 NOTE — Telephone Encounter (Signed)
Patient called and stated he only had one more pain pill and Dr Holley Raring told him he would write one. I talked with Dr Holley Raring and he stated if he could come in for an epidural on Wed. #/20 at 8:15 of 8;30 he would do the procedure and write prescription for Oxycodone. Patient does not need a PA for his procedure. If he is about to come wed. We will need to cancel his appt on 3/27.

## 2018-01-03 NOTE — Telephone Encounter (Signed)
Called pt, Unavailable  Left message with Wife that he was written for a refill of Gabapentin on his last visit .

## 2018-01-03 NOTE — Telephone Encounter (Signed)
Patient came to office and scheduled appt for wed to have epidural and to fill medication.

## 2018-01-05 ENCOUNTER — Other Ambulatory Visit: Payer: Self-pay

## 2018-01-05 ENCOUNTER — Encounter: Payer: Self-pay | Admitting: Student in an Organized Health Care Education/Training Program

## 2018-01-05 ENCOUNTER — Ambulatory Visit (HOSPITAL_BASED_OUTPATIENT_CLINIC_OR_DEPARTMENT_OTHER): Payer: BLUE CROSS/BLUE SHIELD | Admitting: Student in an Organized Health Care Education/Training Program

## 2018-01-05 ENCOUNTER — Ambulatory Visit
Admission: RE | Admit: 2018-01-05 | Discharge: 2018-01-05 | Disposition: A | Discharge status: Home / Self Care | Payer: BLUE CROSS/BLUE SHIELD | Source: Ambulatory Visit | Attending: Student in an Organized Health Care Education/Training Program | Admitting: Student in an Organized Health Care Education/Training Program

## 2018-01-05 VITALS — BP 128/80 | HR 92 | Temp 98.4°F | Resp 16 | Ht 73.0 in | Wt 228.0 lb

## 2018-01-05 DIAGNOSIS — M9983 Other biomechanical lesions of lumbar region: Secondary | ICD-10-CM

## 2018-01-05 DIAGNOSIS — M5416 Radiculopathy, lumbar region: Secondary | ICD-10-CM | POA: Diagnosis not present

## 2018-01-05 DIAGNOSIS — G894 Chronic pain syndrome: Secondary | ICD-10-CM | POA: Insufficient documentation

## 2018-01-05 DIAGNOSIS — M5116 Intervertebral disc disorders with radiculopathy, lumbar region: Secondary | ICD-10-CM | POA: Diagnosis present

## 2018-01-05 DIAGNOSIS — M48061 Spinal stenosis, lumbar region without neurogenic claudication: Secondary | ICD-10-CM | POA: Diagnosis not present

## 2018-01-05 DIAGNOSIS — M5136 Other intervertebral disc degeneration, lumbar region: Secondary | ICD-10-CM

## 2018-01-05 MED ORDER — IOPAMIDOL (ISOVUE-M 200) INJECTION 41%
10.0000 mL | Freq: Once | INTRAMUSCULAR | Status: AC
Start: 2018-01-05 — End: 2018-01-05
  Administered 2018-01-05: 10 mL via EPIDURAL
  Filled 2018-01-05: qty 10

## 2018-01-05 MED ORDER — DEXAMETHASONE SODIUM PHOSPHATE 10 MG/ML IJ SOLN
10.0000 mg | Freq: Once | INTRAMUSCULAR | Status: AC
  Administered 2018-01-05: 10 mg
  Filled 2018-01-05: qty 1

## 2018-01-05 MED ORDER — OXYCODONE HCL 5 MG PO TABS: 5.0000 mg | ORAL_TABLET | Freq: Two times a day (BID) | ORAL | 0 refills | Status: DC | PRN

## 2018-01-05 MED ORDER — SODIUM CHLORIDE 0.9% FLUSH
2.0000 mL | Freq: Once | INTRAVENOUS | Status: AC
Start: 2018-01-05 — End: 2018-01-05
  Administered 2018-01-05: 10 mL

## 2018-01-05 MED ORDER — LIDOCAINE HCL (PF) 1 % IJ SOLN
4.5000 mL | Freq: Once | INTRAMUSCULAR | Status: AC
  Administered 2018-01-05: 5 mL
  Filled 2018-01-05: qty 5

## 2018-01-05 MED ORDER — ROPIVACAINE HCL 2 MG/ML IJ SOLN
2.0000 mL | Freq: Once | INTRAMUSCULAR | Status: AC
  Administered 2018-01-05: 10 mL via EPIDURAL
  Filled 2018-01-05: qty 10

## 2018-01-05 NOTE — Progress Notes (Signed)
Safety precautions to be maintained throughout the outpatient stay will include: orient to surroundings, keep bed in low position, maintain call bell within reach at all times, provide assistance with transfer out of bed and ambulation.  

## 2018-01-05 NOTE — Patient Instructions (Signed)
You were given one prescription for Oxycodone today. Pain Management Discharge Instructions  General Discharge Instructions :  If you need to reach your doctor call: Monday-Friday 8:00 am - 4:00 pm at [phone] or toll free [phone].  After clinic hours [phone] to have operator reach doctor.  Bring all of your medication bottles to all your appointments in the pain clinic.  To cancel or reschedule your appointment with Pain Management please remember to call 24 hours in advance to avoid a fee.  Refer to the educational materials which you have been given on: General Risks, I had my Procedure. Discharge Instructions, Post Sedation.  Post Procedure Instructions:  The drugs you were given will stay in your system until tomorrow, so for the next 24 hours you should not drive, make any legal decisions or drink any alcoholic beverages.  You may eat anything you prefer, but it is better to start with liquids then soups and crackers, and gradually work up to solid foods.  Please notify your doctor immediately if you have any unusual bleeding, trouble breathing or pain that is not related to your normal pain.  Depending on the type of procedure that was done, some parts of your body may feel week and/or numb.  This usually clears up by tonight or the next day.  Walk with the use of an assistive device or accompanied by an adult for the 24 hours.  You may use ice on the affected area for the first 24 hours.  Put ice in a Ziploc bag and cover with a towel and place against area 15 minutes on 15 minutes off.  You may switch to heat after 24 hours.

## 2018-01-05 NOTE — Progress Notes (Signed)
Patient's Name: Anthony Nguyen  MRN: [id number]  Referring Provider: Mikey Nguyen, *  DOB: January 10, 1956  PCP: Anthony College, NP  DOS: 01/05/2018  Note by: [name], MD  Service setting: Ambulatory outpatient  Specialty: Interventional Pain Management  Patient type: Established  Location: [facility] (AMB) Pain Management Facility  Visit type: Interventional Procedure   Primary Reason for Visit: Interventional Pain Management Treatment. CC: Back Pain (lower) and Knee Pain (left)  Procedure:       Anesthesia, Analgesia, Anxiolysis:  Type: Therapeutic Inter-Laminar Epidural Steroid Injection #1  Region: Lumbar Level: L5-S1 Level. Laterality: Midline         Type: Local Anesthesia Indication(s): Analgesia and Anxiety Route: Infiltration ([place]/IM) IV Access: Declined Sedation: Declined  Local Anesthetic: Lidocaine 1%   Indications: 1. Lumbar radiculopathy   2. Lumbar degenerative disc disease   3. Lumbar foraminal stenosis   4. Chronic pain syndrome    Pain Score: Pre-procedure: 8 /10 Post-procedure: 3 /10  Pre-op Assessment:  Anthony Nguyen is a 62 y.o. (year old), male patient, seen today for interventional treatment. 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 Anthony Nguyen has a current medication list which includes the following prescription(s): aspirin, cetirizine, diclofenac sodium, escitalopram, fluticasone, gabapentin, melatonin, and oxycodone. His primarily concern today is the Back Pain (lower) and Knee Pain (left)  Initial Vital Signs:  Pulse Rate: 92 Temp: 98.4 F (36.9 C) Resp: 16 BP: 106/61 SpO2: 96 %  BMI: Estimated body mass index is 30.08 kg/m as calculated from the following:   Height as of this encounter: 6\' 1"  (1.854 m).   Weight as of this encounter: 228 lb (103.4 kg).  Risk Assessment: Allergies:  Reviewed. He has No Known Allergies.  Allergy Precautions: None required Coagulopathies: Reviewed. None identified.  Blood-thinner therapy: None at this time Active Infection(s): Reviewed. None identified. Anthony Nguyen is afebrile  Site Confirmation: Anthony Nguyen was asked to confirm the procedure and laterality before marking the site Procedure checklist: Completed Consent: Before the procedure and under the influence of no sedative(s), amnesic(s), or anxiolytics, the patient was informed of the treatment options, risks and possible complications. To fulfill our ethical and legal obligations, as recommended by the American Medical Association's Code of Ethics, I have informed the patient of my clinical impression; the nature and purpose of the treatment or procedure; the risks, benefits, and possible complications of the intervention; the alternatives, including doing nothing; the risk(s) and benefit(s) of the alternative treatment(s) or procedure(s); and the risk(s) and benefit(s) of doing nothing. The patient was provided information about the general risks and possible complications associated with the procedure. These may include, but are not limited to: failure to achieve desired goals, infection, bleeding, organ or nerve damage, allergic reactions, paralysis, and death. In addition, the patient was informed of those risks and complications associated to Spine-related procedures, such as failure to decrease pain; infection (i.e.: Meningitis, epidural or intraspinal abscess); bleeding (i.e.: epidural hematoma, subarachnoid hemorrhage, or any other type of intraspinal or peri-dural bleeding); organ or nerve damage (i.e.: Any type of peripheral nerve, nerve root, or spinal cord injury) with subsequent damage to sensory, motor, and/or autonomic systems, resulting in permanent pain, numbness, and/or weakness of one or several areas of the body; allergic reactions; (i.e.: anaphylactic reaction); and/or  death. Furthermore, the patient was informed of those risks and complications associated with the medications. These include, but  are not limited to: allergic reactions (i.e.: anaphylactic or anaphylactoid reaction(s)); adrenal axis suppression; blood sugar elevation that in diabetics may result in ketoacidosis or comma; water retention that in patients with history of congestive heart failure may result in shortness of breath, pulmonary edema, and decompensation with resultant heart failure; weight gain; swelling or edema; medication-induced neural toxicity; particulate matter embolism and blood vessel occlusion with resultant organ, and/or nervous system infarction; and/or aseptic necrosis of one or more joints. Finally, the patient was informed that Medicine is not an exact science; therefore, there is also the possibility of unforeseen or unpredictable risks and/or possible complications that may result in a catastrophic outcome. The patient indicated having understood very clearly. We have given the patient no guarantees and we have made no promises. Enough time was given to the patient to ask questions, all of which were answered to the patient's satisfaction. Anthony Nguyen has indicated that he wanted to continue with the procedure. Attestation: I, the ordering provider, attest that I have discussed with the patient the benefits, risks, side-effects, alternatives, likelihood of achieving goals, and potential problems during recovery for the procedure that I have provided informed consent. Date  Time: 01/05/2018  7:42 AM  Pre-Procedure Preparation:  Monitoring: As per clinic protocol. Respiration, ETCO2, SpO2, BP, heart rate and rhythm monitor placed and checked for adequate function Safety Precautions: Patient was assessed for positional comfort and pressure points before starting the procedure. Time-out: I initiated and conducted the "Time-out" before starting the procedure, as per protocol. The  patient was asked to participate by confirming the accuracy of the "Time Out" information. Verification of the correct person, site, and procedure were performed and confirmed by me, the nursing staff, and the patient. "Time-out" conducted as per Joint Commission's Universal Protocol (UP.01.01.01). Time: [phone]  Description of Procedure:       Position: Prone with head of the table was raised to facilitate breathing. Target Area: The interlaminar space, initially targeting the lower laminar border of the superior vertebral body. Approach: Paramedial approach. Area Prepped: Entire Posterior Lumbar Region Prepping solution: ChloraPrep (2% chlorhexidine gluconate and 70% isopropyl alcohol) Safety Precautions: Aspiration looking for blood return was conducted prior to all injections. At no point did we inject any substances, as a needle was being advanced. No attempts were made at seeking any paresthesias. Safe injection practices and needle disposal techniques used. Medications properly checked for expiration dates. SDV (single dose vial) medications used. Description of the Procedure: Protocol guidelines were followed. The procedure needle was introduced through the skin, ipsilateral to the reported pain, and advanced to the target area. Bone was contacted and the needle walked caudad, until the lamina was cleared. The epidural space was identified using "loss-of-resistance technique" with 2-3 ml of PF-NaCl (0.9% NSS), in a 5cc LOR glass syringe. Vitals:   01/05/18 0806 01/05/18 0847 01/05/18 0852 01/05/18 0859  BP: 106/61 115/76 111/80 128/80  Pulse: 92     Resp: 16 15 15 16   Temp: 98.4 F (36.9 C)     TempSrc: Oral     SpO2: 96% 96% 96% 97%  Weight: 228 lb (103.4 kg)     Height: 6\' 1"  (1.854 m)       Start Time: 0846 hrs. End Time: 0853 hrs. Materials:  Needle(s) Type: Epidural needle Gauge: 17G Length: 3.5-in Medication(s): Please see orders for medications and dosing details. 8 CC  solution made of 5 cc of preservative-free saline, 2 cc of 0.2% ropivacaine, 1 cc of Decadron 10  mg/cc Imaging Guidance (Spinal):  Type of Imaging Technique: Fluoroscopy Guidance (Spinal) Indication(s): Assistance in needle guidance and placement for procedures requiring needle placement in or near specific anatomical locations not easily accessible without such assistance. Exposure Time: Please see nurses notes. Contrast: Before injecting any contrast, we confirmed that the patient did not have an allergy to iodine, shellfish, or radiological contrast. Once satisfactory needle placement was completed at the desired level, radiological contrast was injected. Contrast injected under live fluoroscopy. No contrast complications. See chart for type and volume of contrast used. Fluoroscopic Guidance: I was personally present during the use of fluoroscopy. "Tunnel Vision Technique" used to obtain the best possible view of the target area. Parallax error corrected before commencing the procedure. "Direction-depth-direction" technique used to introduce the needle under continuous pulsed fluoroscopy. Once target was reached, antero-posterior, oblique, and lateral fluoroscopic projection used confirm needle placement in all planes. Images permanently stored in EMR. Interpretation: I personally interpreted the imaging intraoperatively. Adequate needle placement confirmed in multiple planes. Appropriate spread of contrast into desired area was observed. No evidence of afferent or efferent intravascular uptake. No intrathecal or subarachnoid spread observed. Permanent images saved into the patient's record.  Antibiotic Prophylaxis:   Anti-infectives (From admission, onward)   None     Indication(s): None identified  Post-operative Assessment:  Post-procedure Vital Signs:  Pulse Rate: 92 Temp: 98.4 F (36.9 C) Resp: 16 BP: 128/80 SpO2: 97 %  EBL: None  Complications: No immediate post-treatment  complications observed by team, or reported by patient.  Note: The patient tolerated the entire procedure well. A repeat set of vitals were taken after the procedure and the patient was kept under observation following institutional policy, for this type of procedure. Post-procedural neurological assessment was performed, showing return to baseline, prior to discharge. The patient was provided with post-procedure discharge instructions, including a section on how to identify potential problems. Should any problems arise concerning this procedure, the patient was given instructions to immediately contact us, at any time, without hesitation. In any case, we plan to contact the patient by telephone for a follow-up status report regarding this interventional procedure.  Comments:  No additional relevant information. 5 out of 5 strength bilateral lower extremity: Plantar flexion, dorsiflexion, knee flexion, knee extension.  Plan of Care  Patient's UDS appropriate.  We will have him sign opioid contract.  Patient informed of our clinic policy in regards to opioid therapy.  [place] PMP checked and appropriate.     Imaging Orders     DG C-Arm 1-60 Min-No Report Procedure Orders    No procedure(s) ordered today    Medications ordered for procedure: Meds ordered this encounter  Medications  . oxyCODONE (OXY IR/ROXICODONE) 5 MG immediate release tablet    Sig: Take 1 tablet (5 mg total) by mouth 2 (two) times daily as needed for severe pain. For chronic pain To last for 30 day from fill date    Dispense:  60 tablet    Refill:  0    Do not place this medication, or any other prescription from our practice, on "Automatic Refill". Patient may have prescription filled one day early if pharmacy is closed on scheduled refill date.  . iopamidol (ISOVUE-M) 41 % intrathecal injection 10 mL  . ropivacaine (PF) 2 mg/mL (0.2%) (NAROPIN) injection 2 mL  . sodium chloride flush (NS) 0.9 % injection 2 mL   . dexamethasone (DECADRON) injection 10 mg  . lidocaine (PF) (XYLOCAINE) 1 % injection 4.5 mL  Medications administered: We administered iopamidol, ropivacaine (PF) 2 mg/mL (0.2%), sodium chloride flush, dexamethasone, and lidocaine (PF).  See the medical record for exact dosing, route, and time of administration.  New Prescriptions   OXYCODONE (OXY IR/ROXICODONE) 5 MG IMMEDIATE RELEASE TABLET    Take 1 tablet (5 mg total) by mouth 2 (two) times daily as needed for severe pain. For chronic pain To last for 30 day from fill date   Disposition: Discharge home  Discharge Date & Time: 01/05/2018; 0900 hrs.   Physician-requested Follow-up: Return in about 3 weeks (around 01/25/2018) for Medication Management, Post Procedure Evaluation.  Future Appointments  Date Time Provider [place]  01/20/2018  8:45 AM [name], MD [facility] None  02/03/2018  3:00 PM Anthony College, NP [facility] None   Primary Care Physician: Anthony College, NP Location: [facility] Outpatient Pain Management Facility Note by: [name], MD Date: 01/05/2018; Time: 9:20 AM  Disclaimer:  Medicine is not an exact science. The only guarantee in medicine is that nothing is guaranteed. It is important to note that the decision to proceed with this intervention was based on the information collected from the patient. The Data and conclusions were drawn from the patient's questionnaire, the interview, and the physical examination. Because the information was provided in large part by the patient, it cannot be guaranteed that it has not been purposely or unconsciously manipulated. Every effort has been made to obtain as much relevant data as possible for this evaluation. It is important to note that the conclusions that lead to this procedure are derived in large part from the available data. Always take into account that the treatment will also be dependent on availability of resources and existing treatment  guidelines, considered by other Pain Management Practitioners as being common knowledge and practice, at the time of the intervention. For Medico-Legal purposes, it is also important to point out that variation in procedural techniques and pharmacological choices are the acceptable norm. The indications, contraindications, technique, and results of the above procedure should only be interpreted and judged by a Board-Certified Interventional Pain Specialist with extensive familiarity and expertise in the same exact procedure and technique.

## 2018-01-06 ENCOUNTER — Telehealth: Payer: Self-pay | Admitting: *Deleted

## 2018-01-06 NOTE — Telephone Encounter (Signed)
Spoke with patient denies any questions or concerns re; procedure on yesterday.  

## 2018-01-10 ENCOUNTER — Telehealth: Payer: Self-pay | Admitting: *Deleted

## 2018-01-10 NOTE — Telephone Encounter (Signed)
Spoke with patient re; medicines.  Patient reports that he has been having to take an additional pain pill each day to deal with his pain.  I explained to patient that he should take his medication as directed and not take an extra pill/day.  Also offered they he may be able to use ibuprofen between doses to keep pain manageable.  Also, explained that he would not want to run out of medicine and that it would need to last until his fill date.    States that he did have good relief from epidural for a couple of days but that the pain was back at this point.  Explained that Dr Holley Raring would like to give it the full 2 weeks before evaluating the back.   Patient verbalizes u/o information and will continue this conversation with Dr Holley Raring when she sees him on January 20, 2018.

## 2018-01-12 ENCOUNTER — Ambulatory Visit: Payer: BLUE CROSS/BLUE SHIELD | Admitting: Student in an Organized Health Care Education/Training Program

## 2018-01-15 ENCOUNTER — Encounter: Payer: Self-pay | Admitting: Nurse Practitioner

## 2018-01-20 ENCOUNTER — Ambulatory Visit
Payer: BLUE CROSS/BLUE SHIELD | Attending: Student in an Organized Health Care Education/Training Program | Admitting: Student in an Organized Health Care Education/Training Program

## 2018-01-20 ENCOUNTER — Encounter: Payer: Self-pay | Admitting: Student in an Organized Health Care Education/Training Program

## 2018-01-20 VITALS — BP 143/77 | HR 69 | Temp 98.1°F | Resp 16 | Ht 73.0 in | Wt 220.0 lb

## 2018-01-20 DIAGNOSIS — K5732 Diverticulitis of large intestine without perforation or abscess without bleeding: Secondary | ICD-10-CM | POA: Diagnosis not present

## 2018-01-20 DIAGNOSIS — M9983 Other biomechanical lesions of lumbar region: Secondary | ICD-10-CM

## 2018-01-20 DIAGNOSIS — G2581 Restless legs syndrome: Secondary | ICD-10-CM | POA: Diagnosis not present

## 2018-01-20 DIAGNOSIS — M5116 Intervertebral disc disorders with radiculopathy, lumbar region: Secondary | ICD-10-CM | POA: Insufficient documentation

## 2018-01-20 DIAGNOSIS — Z7982 Long term (current) use of aspirin: Secondary | ICD-10-CM | POA: Diagnosis not present

## 2018-01-20 DIAGNOSIS — Z5181 Encounter for therapeutic drug level monitoring: Secondary | ICD-10-CM | POA: Diagnosis not present

## 2018-01-20 DIAGNOSIS — M48061 Spinal stenosis, lumbar region without neurogenic claudication: Secondary | ICD-10-CM | POA: Diagnosis not present

## 2018-01-20 DIAGNOSIS — Z96652 Presence of left artificial knee joint: Secondary | ICD-10-CM | POA: Diagnosis not present

## 2018-01-20 DIAGNOSIS — M5416 Radiculopathy, lumbar region: Secondary | ICD-10-CM | POA: Diagnosis not present

## 2018-01-20 DIAGNOSIS — Z79891 Long term (current) use of opiate analgesic: Secondary | ICD-10-CM | POA: Diagnosis not present

## 2018-01-20 DIAGNOSIS — M5136 Other intervertebral disc degeneration, lumbar region: Secondary | ICD-10-CM

## 2018-01-20 DIAGNOSIS — Z79899 Other long term (current) drug therapy: Secondary | ICD-10-CM | POA: Diagnosis not present

## 2018-01-20 DIAGNOSIS — G894 Chronic pain syndrome: Secondary | ICD-10-CM | POA: Diagnosis present

## 2018-01-20 DIAGNOSIS — M170 Bilateral primary osteoarthritis of knee: Secondary | ICD-10-CM | POA: Diagnosis not present

## 2018-01-20 DIAGNOSIS — K219 Gastro-esophageal reflux disease without esophagitis: Secondary | ICD-10-CM | POA: Insufficient documentation

## 2018-01-20 DIAGNOSIS — F17210 Nicotine dependence, cigarettes, uncomplicated: Secondary | ICD-10-CM | POA: Insufficient documentation

## 2018-01-20 MED ORDER — OXYCODONE-ACETAMINOPHEN 5-325 MG PO TABS: 1.0000 | ORAL_TABLET | Freq: Three times a day (TID) | ORAL | 0 refills | Status: DC | PRN

## 2018-01-20 NOTE — Progress Notes (Signed)
Patient's Name: Anthony Nguyen  MRN: [id number]  Referring Provider: Mikey Nguyen, *  DOB: September 04, 1956  PCP: Anthony College, NP  DOS: 01/20/2018  Note by: Anthony Santa, MD  Service setting: Ambulatory outpatient  Specialty: Interventional Pain Management  Location: [facility] (AMB) Pain Management Facility    Patient type: Established   Primary Reason(s) for Visit: Encounter for prescription drug management & post-procedure evaluation of chronic illness with mild to moderate exacerbation(Level of risk: moderate) CC: Back Pain (lower right is worse) and Knee Pain (left s/p joint replacement 10/14/17)  HPI  [name] is a 62 y.o. year old, male patient, who comes today for a post-procedure evaluation and medication management. He has Sleep disturbance; Allergy; GERD (gastroesophageal reflux disease); Tobacco abuse counseling; Ringworm; Chronic nasal congestion; Benign paroxysmal positional vertigo; Primary osteoarthritis of left knee; Lumbar radiculopathy; Lumbar degenerative disc disease; Lumbar foraminal stenosis; History of left knee replacement; Primary osteoarthritis of both knees; and Chronic pain syndrome on their problem list. His primarily concern today is the Back Pain (lower right is worse) and Knee Pain (left s/p joint replacement 10/14/17)  Pain Assessment: Location: Left Knee(back lower right) Radiating: back pain going into right buttocks, knee pain runs up the inside of leg and down the calf feet are burning Onset: More than a month ago Duration: Chronic pain Quality: Aching, Discomfort, Constant, Burning Severity: 7 /10 (self-reported pain score)  Note: Reported level is inconsistent with clinical observations. Clinically the patient looks like a 3/10 A 3/10 is viewed as "Moderate" and described as significantly interfering with activities of daily living (ADL). It becomes difficult to feed, bathe, get dressed, get on and off the toilet or to perform personal hygiene  functions. Difficult to get in and out of bed or a chair without assistance. Very distracting. With effort, it can be ignored when deeply involved in activities.       When using our objective Pain Scale, levels between 6 and 10/10 are said to belong in an emergency room, as it progressively worsens from a 6/10, described as severely limiting, requiring emergency care not usually available at an outpatient pain management facility. At a 6/10 level, communication becomes difficult and requires great effort. Assistance to reach the emergency department may be required. Facial flushing and profuse sweating along with potentially dangerous increases in heart rate and blood pressure will be evident. Effect on ADL: work activities seem to aggravate the pain in the back.  knee pain is always there.  Timing: Constant Modifying factors: stretching the back helps.  nothing helps the knee.  takes 1 in the morning, 1 about 1430, and 1 at bedtime   [name] was last seen on 01/05/2018 for a procedure. During today's appointment we reviewed [name]'s post-procedure results, as well as his outpatient medication regimen.  Further details on both, my assessment(s), as well as the proposed treatment plan, please see below.  Controlled Substance Pharmacotherapy Assessment REMS (Risk Evaluation and Mitigation Strategy)  Analgesic: Oxycodone 5 mg BID prn, #60.month MME/day: 15 mg/day.  Janett Billow, RN  01/20/2018  8:37 AM  Sign at close encounter Nursing Pain Medication Assessment:  Safety precautions to be maintained throughout the outpatient stay will include: orient to surroundings, keep bed in low position, maintain call bell within reach at all times, provide assistance with transfer out of bed and ambulation.  Medication Inspection Compliance: Pill count conducted under aseptic conditions, in front of the patient. Neither the pills nor the bottle was removed  from the patient's sight at any time.  Once count was completed pills were immediately returned to the patient in their original bottle.  Medication: Oxycodone IR Pill/Patch Count: 8 of 60 pills remain Pill/Patch Appearance: Markings consistent with prescribed medication Bottle Appearance: Standard pharmacy container. Clearly labeled. Filled Date: 03 / 20 / 2019 Last Medication intake:  Today   Pharmacokinetics: Liberation and absorption (onset of action): WNL Distribution (time to peak effect): WNL Metabolism and excretion (duration of action): WNL         Pharmacodynamics: Desired effects: Analgesia: [name] reports 50% benefit. Functional ability: Patient reports that medication does help, but not nearly as much as he would like Clinically meaningful improvement in function (CMIF): Sustained CMIF goals met Perceived effectiveness: Described as relatively effective but with some room for improvement Undesirable effects: Side-effects or Adverse reactions: None reported Monitoring:  PMP: Online review of the past 68-monthperiod conducted. Compliant with practice rules and regulations Last UDS on record: Summary  Date Value Ref Range Status  12/28/2017 FINAL  Final    Comment:    ==================================================================== [facility] COMP DRUG ANALYSIS,UR ==================================================================== Test                             Result       Flag       Units Drug Present and Declared for Prescription Verification   Oxycodone                      960          EXPECTED   ng/mg creat   Oxymorphone                    299          EXPECTED   ng/mg creat   Noroxycodone                   2090         EXPECTED   ng/mg creat   Noroxymorphone                 134          EXPECTED   ng/mg creat    Sources of oxycodone are scheduled prescription medications.    Oxymorphone, noroxycodone, and noroxymorphone are expected    metabolites of oxycodone. Oxymorphone is also  available as a    scheduled prescription medication.   Citalopram                     PRESENT      EXPECTED   Desmethylcitalopram            PRESENT      EXPECTED    Desmethylcitalopram is an expected metabolite of citalopram or    the enantiomeric form, escitalopram.   Acetaminophen                  PRESENT      EXPECTED Drug Present not Declared for Prescription Verification   Diphenhydramine                PRESENT      UNEXPECTED   Doxylamine                     PRESENT      UNEXPECTED Drug Absent but Declared for Prescription Verification   Gabapentin  Not Detected UNEXPECTED   Diclofenac                     Not Detected UNEXPECTED    Diclofenac, as indicated in the declared medication list, is not    always detected even when used as directed. ==================================================================== Test                      Result    Flag   Units      Ref Range   Creatinine              181              mg/dL      >=20 ==================================================================== Declared Medications:  The flagging and interpretation on this report are based on the  following declared medications.  Unexpected results may arise from  inaccuracies in the declared medications.  **Note: The testing scope of this panel includes these medications:  Escitalopram  Gabapentin  Oxycodone (Oxycodone Acetaminophen)  **Note: The testing scope of this panel does not include small to  moderate amounts of these reported medications:  Acetaminophen (Oxycodone Acetaminophen)  Diclofenac  **Note: The testing scope of this panel does not include following  reported medications:  Cetirizine  Fluticasone  Melatonin ==================================================================== For clinical consultation, please call [phone]. ====================================================================    UDS interpretation: Compliant           Medication Assessment Form: Reviewed. Patient indicates being compliant with therapy Treatment compliance: Compliant Risk Assessment Profile: Aberrant behavior: See prior evaluations. None observed or detected today Comorbid factors increasing risk of overdose: See prior notes. No additional risks detected today Risk of substance use disorder (SUD): Low Opioid Risk Tool - 01/05/18 2355      Family History of Substance Abuse   Alcohol  Negative    Illegal Drugs  Negative    Rx Drugs  Negative      Personal History of Substance Abuse   Alcohol  Negative    Illegal Drugs  Negative    Rx Drugs  Negative      Age   Age between 59-45 years   No      History of Preadolescent Sexual Abuse   History of Preadolescent Sexual Abuse  Negative or Male      Psychological Disease   Psychological Disease  Negative    Depression  Positive      Total Score   Opioid Risk Tool Scoring  1    Opioid Risk Interpretation  Low Risk      ORT Scoring interpretation table:  Score <3 = Low Risk for SUD  Score between 4-7 = Moderate Risk for SUD  Score >8 = High Risk for Opioid Abuse   Risk Mitigation Strategies:  Patient Counseling: Covered Patient-Prescriber Agreement (PPA): Present and active  Notification to other healthcare providers: Done  Pharmacologic Plan: Change to Percocet 5 mg TID prn, #90.month             Post-Procedure Assessment  01/05/2018 Procedure: L5/S1 ESI #1 Pre-procedure pain score:  8/10 Post-procedure pain score: 3/10         Influential Factors: BMI: 29.03 kg/m Intra-procedural challenges: None observed.         Assessment challenges: None detected.              Reported side-effects: None.        Post-procedural adverse reactions or complications: None reported  Sedation: Please see nurses note. When no sedatives are used, the analgesic levels obtained are directly associated to the effectiveness of the local anesthetics. However, when sedation is  provided, the level of analgesia obtained during the initial 1 hour following the intervention, is believed to be the result of a combination of factors. These factors may include, but are not limited to: 1. The effectiveness of the local anesthetics used. 2. The effects of the analgesic(s) and/or anxiolytic(s) used. 3. The degree of discomfort experienced by the patient at the time of the procedure. 4. The patients ability and reliability in recalling and recording the events. 5. The presence and influence of possible secondary gains and/or psychosocial factors. Reported result: Relief experienced during the 1st hour after the procedure: 100 % (Ultra-Short Term Relief)            Interpretative annotation: Clinically appropriate result. Analgesia during this period is likely to be Local Anesthetic and/or IV Sedative (Analgesic/Anxiolytic) related.          Effects of local anesthetic: The analgesic effects attained during this period are directly associated to the localized infiltration of local anesthetics and therefore cary significant diagnostic value as to the etiological location, or anatomical origin, of the pain. Expected duration of relief is directly dependent on the pharmacodynamics of the local anesthetic used. Long-acting (4-6 hours) anesthetics used.  Reported result: Relief during the next 4 to 6 hour after the procedure: 100 % (Short-Term Relief)            Interpretative annotation: Clinically appropriate result. Analgesia during this period is likely to be Local Anesthetic-related.          Long-term benefit: Defined as the period of time past the expected duration of local anesthetics (1 hour for short-acting and 4-6 hours for long-acting). With the possible exception of prolonged sympathetic blockade from the local anesthetics, benefits during this period are typically attributed to, or associated with, other factors such as analgesic sensory neuropraxia, antiinflammatory effects, or  beneficial biochemical changes provided by agents other than the local anesthetics.  Reported result: Extended relief following procedure: 60 %(pain relief lasted approx 2 weeks. ) (Long-Term Relief)            Interpretative annotation: Clinically appropriate result. Good relief. No permanent benefit expected. Inflammation plays a part in the etiology to the pain.          Current benefits: Defined as reported results that persistent at this point in time.   Analgesia: 0-25 %            Function: Back to baseline ROM: Back to baseline Interpretative annotation: Recurrence of symptoms. No permanent benefit expected. Effective diagnostic intervention.          Interpretation: Results would suggest a successful diagnostic intervention. We'll proceed with diagnostic intervention #2, as soon as convenient          Plan:  Please see "Plan of Care" for details.                Laboratory Chemistry  Inflammation Markers (CRP: Acute Phase) (ESR: Chronic Phase) Lab Results  Component Value Date   ESRSEDRATE 30 (H) 09/29/2017                         Rheumatology Markers No results found for: RF, ANA, LABURIC, URICUR, LYMEIGGIGMAB, LYMEABIGMQN  Renal Function Markers Lab Results  Component Value Date   BUN 13 10/16/2017   CREATININE 1.01 10/16/2017   GFRAA >60 10/16/2017   GFRNONAA >60 10/16/2017                              Hepatic Function Markers Lab Results  Component Value Date   AST 22 12/16/2015   ALT 22 12/16/2015   ALBUMIN 4.7 12/16/2015   ALKPHOS 73 12/16/2015   LIPASE 31 03/17/2014                        Electrolytes Lab Results  Component Value Date   NA 133 (L) 10/16/2017   K 4.0 10/16/2017   CL 98 (L) 10/16/2017   CALCIUM 9.0 10/16/2017                        Neuropathy Markers No results found for: VITAMINB12, FOLATE, HGBA1C, HIV                      Bone Pathology Markers No results found for: VD25OH, VD125OH2TOT, GQ6761PJ0, DT2671IW5,  25OHVITD1, 25OHVITD2, 25OHVITD3, TESTOFREE, TESTOSTERONE                       Coagulation Parameters Lab Results  Component Value Date   INR 0.96 09/29/2017   LABPROT 12.7 09/29/2017   APTT 31 09/29/2017   PLT 187 10/16/2017                        Cardiovascular Markers Lab Results  Component Value Date   TROPONINI <0.03 11/16/2014   HGB 12.9 (L) 10/16/2017   HCT 38.8 (L) 10/16/2017                         CA Markers No results found for: CEA, CA125, LABCA2                      Note: Lab results reviewed.    Meds   Current Outpatient Medications:  .  cetirizine (ZYRTEC) 10 MG tablet, Take 1 tablet (10 mg total) by mouth daily., Disp: 30 tablet, Rfl: 11 .  Diclofenac Sodium 3 % GEL, Place 1 application onto the skin 3 (three) times daily as needed (left knee pain)., Disp: 1 Tube, Rfl: 2 .  escitalopram (LEXAPRO) 10 MG tablet, Take 10 mg by mouth daily., Disp: , Rfl:  .  fluticasone (FLONASE) 50 MCG/ACT nasal spray, Place 2 sprays into both nostrils daily., Disp: 16 g, Rfl: 11 .  aspirin 81 MG chewable tablet, Chew by mouth daily., Disp: , Rfl:  .  Melatonin 1 MG TABS, Take 1 tablet by mouth at bedtime as needed., Disp: , Rfl:  .  oxyCODONE-acetaminophen (PERCOCET) 5-325 MG tablet, Take 1 tablet by mouth every 8 (eight) hours as needed for severe pain., Disp: 90 tablet, Rfl: 0  ROS  Constitutional: Denies any fever or chills Gastrointestinal: No reported hemesis, hematochezia, vomiting, or acute GI distress Musculoskeletal: Denies any acute onset joint swelling, redness, loss of ROM, or weakness Neurological: No reported episodes of acute onset apraxia, aphasia, dysarthria, agnosia, amnesia, paralysis, loss of coordination, or loss of consciousness  Allergies  Mr. Roat has No Known Allergies.  PFSH  Drug: Mr. Maul  reports that he does not use  drugs. Alcohol:  reports that he does not drink alcohol. Tobacco:  reports that he has been smoking cigarettes.  He  has a 30.00 pack-year smoking history. He has never used smokeless tobacco. Medical:  has a past medical history of Allergy, Arthritis, Cancer ([place]), Current every day smoker, Diverticulosis, GERD (gastroesophageal reflux disease), H/O diverticulitis of colon, Restless leg syndrome, and Sciatic pain. Surgical: Mr. 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 Family: family history includes Alzheimer's disease in his father; Cancer (age of onset: 57) in his sister; Diabetes in his sister; Healthy in his brother, son, son, son, and son; Heart disease in his brother; Hypertension in his mother.  Constitutional Exam  General appearance: Well nourished, well developed, and well hydrated. In no apparent acute distress Vitals:   01/20/18 0830  BP: (!) 143/77  Pulse: 69  Resp: 16  Temp: 98.1 F (36.7 C)  TempSrc: Oral  SpO2: 98%  Weight: 220 lb (99.8 kg)  Height: '6\' 1"'  (1.854 m)   BMI Assessment: Estimated body mass index is 29.03 kg/m as calculated from the following:   Height as of this encounter: '6\' 1"'  (1.854 m).   Weight as of this encounter: 220 lb (99.8 kg).  BMI interpretation table: BMI level Category Range association with higher incidence of chronic pain  <18 kg/m2 Underweight   18.5-24.9 kg/m2 Ideal body weight   25-29.9 kg/m2 Overweight Increased incidence by 20%  30-34.9 kg/m2 Obese (Class I) Increased incidence by 68%  35-39.9 kg/m2 Severe obesity (Class II) Increased incidence by 136%  >40 kg/m2 Extreme obesity (Class III) Increased incidence by 254%   BMI Readings from Last 4 Encounters:  01/20/18 29.03 kg/m  01/05/18 30.08 kg/m  12/28/17 30.34 kg/m  12/23/17 28.76 kg/m   Wt Readings from Last 4 Encounters:  01/20/18 220 lb (99.8 kg)  01/05/18 228 lb (103.4 kg)  12/28/17 230 lb (104.3 kg)  12/23/17 218 lb (98.9 kg)   Psych/Mental status: Alert, oriented x 3 (person, place, & time)       Eyes: PERLA Respiratory: No evidence of acute respiratory distress  Cervical Spine Area Exam  Skin & Axial Inspection: No masses, redness, edema, swelling, or associated skin lesions Alignment: Symmetrical Functional ROM: Unrestricted ROM      Stability: No instability detected Muscle Tone/Strength: Functionally intact. No obvious neuro-muscular anomalies detected. Sensory (Neurological): Unimpaired Palpation: No palpable anomalies              Upper Extremity (UE) Exam    Side: Right upper extremity  Side: Left upper extremity  Skin & Extremity Inspection: Skin color, temperature, and hair growth are WNL. No peripheral edema or cyanosis. No masses, redness, swelling, asymmetry, or associated skin lesions. No contractures.  Skin & Extremity Inspection: Skin color, temperature, and hair growth are WNL. No peripheral edema or cyanosis. No masses, redness, swelling, asymmetry, or associated skin lesions. No contractures.  Functional ROM: Unrestricted ROM          Functional ROM: Unrestricted ROM          Muscle Tone/Strength: Functionally intact. No obvious neuro-muscular anomalies detected.  Muscle Tone/Strength: Functionally intact. No obvious neuro-muscular anomalies detected.  Sensory (Neurological): Unimpaired          Sensory (Neurological): Unimpaired          Palpation: No palpable anomalies  Palpation: No palpable anomalies              Specialized Test(s): Deferred         Specialized Test(s): Deferred          Thoracic Spine Area Exam  Skin & Axial Inspection: No masses, redness, or swelling Alignment: Symmetrical Functional ROM: Unrestricted ROM Stability: No instability detected Muscle Tone/Strength: Functionally intact. No obvious neuro-muscular anomalies detected. Sensory (Neurological): Unimpaired Muscle strength & Tone: No palpable anomalies  Lumbar Spine Area Exam  Skin & Axial  Inspection: No masses, redness, or swelling Alignment: Symmetrical Functional ROM: Decreased ROM      Stability: No instability detected Muscle Tone/Strength: Functionally intact. No obvious neuro-muscular anomalies detected. Sensory (Neurological): Articular pain pattern Palpation: Complains of area being tender to palpation       Provocative Tests: Lumbar Hyperextension and rotation test: Positive bilaterally for facet joint pain. Lumbar Lateral bending test: Positive ipsilateral radicular pain, bilaterally. Positive for bilateral foraminal stenosis. Patrick's Maneuver: evaluation deferred today                    Gait & Posture Assessment  Ambulation: Unassisted Gait: Relatively normal for age and body habitus Posture: WNL   Lower Extremity Exam    Side: Right lower extremity  Side: Left lower extremity  Skin & Extremity Inspection: Skin color, temperature, and hair growth are WNL. No peripheral edema or cyanosis. No masses, redness, swelling, asymmetry, or associated skin lesions. No contractures.  Skin & Extremity Inspection: Evidence of prior arthroplastic surgery  Functional ROM: Unrestricted ROM          Functional ROM: Decreased ROM          Muscle Tone/Strength: Functionally intact. No obvious neuro-muscular anomalies detected.  Muscle Tone/Strength: Functionally intact. No obvious neuro-muscular anomalies detected.  Sensory (Neurological): Unimpaired  Sensory (Neurological): Arthropathic arthralgia  Palpation: No palpable anomalies  Palpation: No palpable anomalies   Assessment  Primary Diagnosis & Pertinent Problem List: The primary encounter diagnosis was Chronic pain syndrome. Diagnoses of Lumbar radiculopathy, Lumbar degenerative disc disease, Lumbar foraminal stenosis, History of left knee replacement, and Primary osteoarthritis of both knees were also pertinent to this visit.  Status Diagnosis  Persistent Responding Persistent 1. Chronic pain syndrome   2. Lumbar  radiculopathy   3. Lumbar degenerative disc disease   4. Lumbar foraminal stenosis   5. History of left knee replacement   6. Primary osteoarthritis of both knees     General Recommendations: The pain condition that the patient suffers from is best treated with a multidisciplinary approach that involves an increase in physical activity to prevent de-conditioning and worsening of the pain cycle, as well as psychological counseling (formal and/or informal) to address the co-morbid psychological affects of pain. Treatment will often involve judicious use of pain medications and interventional procedures to decrease the pain, allowing the patient to participate in the physical activity that will ultimately produce long-lasting pain reductions. The goal of the multidisciplinary approach is to return the patient to a higher level of overall function and to restore their ability to perform activities of daily living.  62 year old male with history of chronic pain localized to his left knee status post left knee replacement surgery as well as chronic axial low back pain that radiates to bilateral buttocks and thighs, right greater than left secondary to lumbar degenerative disc disease, foraminal stenosis, radiculopathy.  Patient states that he has been having to take more of his oxycodone for pain  relief and states that 2 tablets a day is not providing him with sufficient pain relief to function.  We discussed transitioning him back to Percocet which he was on in the past and states provided better pain relief.  We will also increase total monthly tablets to 90 so that he can take it up to 3 times a day.  We discussed no further dose escalation's beyond this.  In regards to lumbar epidural steroid injection, patient got significant pain relief for approximately 2 weeks but then states that the pain returned.  We discussed repeating lumbar ESI.  Risks and benefits were discussed.  Patient would like to  proceed.  Plan: -Repeat UDS -Prescription for Percocet 5 mg 3 times daily as needed, quantity 90 for 2 months -Repeat lumbar ESI #2. -Continue Voltaren gel 1 application 3 times daily as needed -Continue Lexapro 10 mg daily -Increase melatonin to 4 mg nightly for insomnia.  Future considerations: Left knee genicular nerve block  Plan of Care  Pharmacotherapy (Medications Ordered): Meds ordered this encounter  Medications  . DISCONTD: oxyCODONE-acetaminophen (PERCOCET) 5-325 MG tablet    Sig: Take 1 tablet by mouth every 8 (eight) hours as needed for severe pain.    Dispense:  90 tablet    Refill:  0    Do not place this medication, or any other prescription from our practice, on "Automatic Refill". Patient may have prescription filled one day early if pharmacy is closed on scheduled refill date.  For chronic pain To last for 30 days from fill date To fill on or after: 01/22/18, 02/20/18  . oxyCODONE-acetaminophen (PERCOCET) 5-325 MG tablet    Sig: Take 1 tablet by mouth every 8 (eight) hours as needed for severe pain.    Dispense:  90 tablet    Refill:  0    Do not place this medication, or any other prescription from our practice, on "Automatic Refill". Patient may have prescription filled one day early if pharmacy is closed on scheduled refill date.  For chronic pain To last for 30 days from fill date To fill on or after: 01/22/18, 02/20/18   Lab-work, procedure(s), and/or referral(s): Orders Placed This Encounter  Procedures  . Lumbar Epidural Injection  . ToxASSURE Select 13 (MW), Urine     Provider-requested follow-up: Return in about 3 weeks (around 02/10/2018) for Procedure. Time Note: Greater than 50% of the 25 minute(s) of face-to-face time spent with Mr. Kocher, was spent in counseling/coordination of care regarding: the appropriate use of the pain scale, Mr. Swider's primary cause of pain, the treatment plan, treatment alternatives, the risks and possible  complications of proposed treatment, going over the informed consent, the opioid analgesic risks and possible complications, the results, interpretation and significance of  his recent diagnostic interventional treatment(s), the appropriate use of his medications, realistic expectations, the goals of pain management (increased in functionality), the medication agreement and the patient's responsibilities when it comes to controlled substances. Future Appointments  Date Time Provider [place]  02/03/2018  3:00 PM Anthony College, NP [facility] None  02/09/2018  9:30 AM Anthony Santa, MD [facility] None  03/17/2018  8:30 AM Anthony Santa, MD [facility] None    Primary Care Physician: Anthony College, NP Location: [facility] Note by: Anthony Nguyen, M.D Date: 01/20/2018; Time: 10:21 AM  Patient Instructions  ____________________________________________________________________________________________  General Risks and Possible Complications  Patient Responsibilities: It is important that you read this as it is part of your informed consent.  It is our duty to inform you of the risks and possible complications associated with treatments offered to you. It is your responsibility as a patient to read this and to ask questions about anything that is not clear or that you believe was not covered in this document.  Patient's Rights: You have the right to refuse treatment. You also have the right to change your mind, even after initially having agreed to have the treatment done. However, under this last option, if you wait until the last second to change your mind, you may be charged for the materials used up to that point.  Introduction: Medicine is not an Chief Strategy Officer. Everything in Medicine, including the lack of treatment(s), carries the potential for danger, harm, or loss (which is by definition: Risk). In Medicine, a complication is a secondary problem,  condition, or disease that can aggravate an already existing one. All treatments carry the risk of possible complications. The fact that a side effects or complications occurs, does not imply that the treatment was conducted incorrectly. It must be clearly understood that these can happen even when everything is done following the highest safety standards.  No treatment: You can choose not to proceed with the proposed treatment alternative. The "PRO(s)" would include: avoiding the risk of complications associated with the therapy. The "CON(s)" would include: not getting any of the treatment benefits. These benefits fall under one of three categories: diagnostic; therapeutic; and/or palliative. Diagnostic benefits include: getting information which can ultimately lead to improvement of the disease or symptom(s). Therapeutic benefits are those associated with the successful treatment of the disease. Finally, palliative benefits are those related to the decrease of the primary symptoms, without necessarily curing the condition (example: decreasing the pain from a flare-up of a chronic condition, such as incurable terminal cancer).  General Risks and Complications: These are associated to most interventional treatments. They can occur alone, or in combination. They fall under one of the following six (6) categories: no benefit or worsening of symptoms; bleeding; infection; nerve damage; allergic reactions; and/or death. 1. No benefits or worsening of symptoms: In Medicine there are no guarantees, only probabilities. No healthcare provider can ever guarantee that a medical treatment will work, they can only state the probability that it may. Furthermore, there is always the possibility that the condition may worsen, either directly, or indirectly, as a consequence of the treatment. 2. Bleeding: This is more common if the patient is taking a blood thinner, either prescription or over the counter (example: Goody  Powders, Fish oil, Aspirin, Garlic, etc.), or if suffering a condition associated with impaired coagulation (example: Hemophilia, cirrhosis of the liver, low platelet counts, etc.). However, even if you do not have one on these, it can still happen. If you have any of these conditions, or take one of these drugs, make sure to notify your treating physician. 3. Infection: This is more common in patients with a compromised immune system, either due to disease (example: diabetes, cancer, human immunodeficiency virus [HIV], etc.), or due to medications or treatments (example: therapies used to treat cancer and rheumatological diseases). However, even if you do not have one on these, it can still happen. If you have any of these conditions, or take one of these drugs, make sure to notify your treating physician. 4. Nerve Damage: This is more common when the treatment is an invasive one, but it can also happen with the use of medications, such as those used in the treatment of cancer.  The damage can occur to small secondary nerves, or to large primary ones, such as those in the spinal cord and brain. This damage may be temporary or permanent and it may lead to impairments that can range from temporary numbness to permanent paralysis and/or brain death. 5. Allergic Reactions: Any time a substance or material comes in contact with our body, there is the possibility of an allergic reaction. These can range from a mild skin rash (contact dermatitis) to a severe systemic reaction (anaphylactic reaction), which can result in death. 6. Death: In general, any medical intervention can result in death, most of the time due to an unforeseen complication. ____________________________________________________________________________________________  Epidural Steroid Injection Patient Information  Description: The epidural space surrounds the nerves as they exit the spinal cord.  In some patients, the nerves can be compressed  and inflamed by a bulging disc or a tight spinal canal (spinal stenosis).  By injecting steroids into the epidural space, we can bring irritated nerves into direct contact with a potentially helpful medication.  These steroids act directly on the irritated nerves and can reduce swelling and inflammation which often leads to decreased pain.  Epidural steroids may be injected anywhere along the spine and from the neck to the low back depending upon the location of your pain.   After numbing the skin with local anesthetic (like Novocaine), a small needle is passed into the epidural space slowly.  You may experience a sensation of pressure while this is being done.  The entire block usually last less than 10 minutes.  Conditions which may be treated by epidural steroids:   Low back and leg pain  Neck and arm pain  Spinal stenosis  Post-laminectomy syndrome  Herpes zoster (shingles) pain  Pain from compression fractures  Preparation for the injection:  1. Do not eat any solid food or dairy products within 8 hours of your appointment.  2. You may drink clear liquids up to 3 hours before appointment.  Clear liquids include water, black coffee, juice or soda.  No milk or cream please. 3. You may take your regular medication, including pain medications, with a sip of water before your appointment  Diabetics should hold regular insulin (if taken separately) and take 1/2 normal NPH dos the morning of the procedure.  Carry some sugar containing items with you to your appointment. 4. A driver must accompany you and be prepared to drive you home after your procedure.  5. Bring all your current medications with your. 6. An IV may be inserted and sedation may be given at the discretion of the physician.   7. A blood pressure cuff, EKG and other monitors will often be applied during the procedure.  Some patients may need to have extra oxygen administered for a short period. 8. You will be asked to provide  medical information, including your allergies, prior to the procedure.  We must know immediately if you are taking blood thinners (like Coumadin/Warfarin)  Or if you are allergic to IV iodine contrast (dye). We must know if you could possible be pregnant.  Possible side-effects:  Bleeding from needle site  Infection (rare, may require surgery)  Nerve injury (rare)  Numbness & tingling (temporary)  Difficulty urinating (rare, temporary)  Spinal headache ( a headache worse with upright posture)  Light -headedness (temporary)  Pain at injection site (several days)  Decreased blood pressure (temporary)  Weakness in arm/leg (temporary)  Pressure sensation in back/neck (temporary)  Call if you experience:  Fever/chills  associated with headache or increased back/neck pain.  Headache worsened by an upright position.  New onset weakness or numbness of an extremity below the injection site  Hives or difficulty breathing (go to the emergency room)  Inflammation or drainage at the infection site  Severe back/neck pain  Any new symptoms which are concerning to you  Please note:  Although the local anesthetic injected can often make your back or neck feel good for several hours after the injection, the pain will likely return.  It takes 3-7 days for steroids to work in the epidural space.  You may not notice any pain relief for at least that one week.  If effective, we will often do a series of three injections spaced 3-6 weeks apart to maximally decrease your pain.  After the initial series, we generally will wait several months before considering a repeat injection of the same type.  If you have any questions, please call [phone] [facility]

## 2018-01-20 NOTE — Progress Notes (Signed)
Nursing Pain Medication Assessment:  Safety precautions to be maintained throughout the outpatient stay will include: orient to surroundings, keep bed in low position, maintain call bell within reach at all times, provide assistance with transfer out of bed and ambulation.  Medication Inspection Compliance: Pill count conducted under aseptic conditions, in front of the patient. Neither the pills nor the bottle was removed from the patient's sight at any time. Once count was completed pills were immediately returned to the patient in their original bottle.  Medication: Oxycodone IR Pill/Patch Count: 8 of 60 pills remain Pill/Patch Appearance: Markings consistent with prescribed medication Bottle Appearance: Standard pharmacy container. Clearly labeled. Filled Date: 03 / 20 / 2019 Last Medication intake:  Today

## 2018-01-20 NOTE — Patient Instructions (Signed)
____________________________________________________________________________________________  General Risks and Possible Complications  Patient Responsibilities: It is important that you read this as it is part of your informed consent. It is our duty to inform you of the risks and possible complications associated with treatments offered to you. It is your responsibility as a patient to read this and to ask questions about anything that is not clear or that you believe was not covered in this document.  Patient's Rights: You have the right to refuse treatment. You also have the right to change your mind, even after initially having agreed to have the treatment done. However, under this last option, if you wait until the last second to change your mind, you may be charged for the materials used up to that point.  Introduction: Medicine is not an exact science. Everything in Medicine, including the lack of treatment(s), carries the potential for danger, harm, or loss (which is by definition: Risk). In Medicine, a complication is a secondary problem, condition, or disease that can aggravate an already existing one. All treatments carry the risk of possible complications. The fact that a side effects or complications occurs, does not imply that the treatment was conducted incorrectly. It must be clearly understood that these can happen even when everything is done following the highest safety standards.  No treatment: You can choose not to proceed with the proposed treatment alternative. The "PRO(s)" would include: avoiding the risk of complications associated with the therapy. The "CON(s)" would include: not getting any of the treatment benefits. These benefits fall under one of three categories: diagnostic; therapeutic; and/or palliative. Diagnostic benefits include: getting information which can ultimately lead to improvement of the disease or symptom(s). Therapeutic benefits are those associated with the  successful treatment of the disease. Finally, palliative benefits are those related to the decrease of the primary symptoms, without necessarily curing the condition (example: decreasing the pain from a flare-up of a chronic condition, such as incurable terminal cancer).  General Risks and Complications: These are associated to most interventional treatments. They can occur alone, or in combination. They fall under one of the following six (6) categories: no benefit or worsening of symptoms; bleeding; infection; nerve damage; allergic reactions; and/or death. 1. No benefits or worsening of symptoms: In Medicine there are no guarantees, only probabilities. No healthcare provider can ever guarantee that a medical treatment will work, they can only state the probability that it may. Furthermore, there is always the possibility that the condition may worsen, either directly, or indirectly, as a consequence of the treatment. 2. Bleeding: This is more common if the patient is taking a blood thinner, either prescription or over the counter (example: Goody Powders, Fish oil, Aspirin, Garlic, etc.), or if suffering a condition associated with impaired coagulation (example: Hemophilia, cirrhosis of the liver, low platelet counts, etc.). However, even if you do not have one on these, it can still happen. If you have any of these conditions, or take one of these drugs, make sure to notify your treating physician. 3. Infection: This is more common in patients with a compromised immune system, either due to disease (example: diabetes, cancer, human immunodeficiency virus [HIV], etc.), or due to medications or treatments (example: therapies used to treat cancer and rheumatological diseases). However, even if you do not have one on these, it can still happen. If you have any of these conditions, or take one of these drugs, make sure to notify your treating physician. 4. Nerve Damage: This is more common when the   treatment is  an invasive one, but it can also happen with the use of medications, such as those used in the treatment of cancer. The damage can occur to small secondary nerves, or to large primary ones, such as those in the spinal cord and brain. This damage may be temporary or permanent and it may lead to impairments that can range from temporary numbness to permanent paralysis and/or brain death. 5. Allergic Reactions: Any time a substance or material comes in contact with our body, there is the possibility of an allergic reaction. These can range from a mild skin rash (contact dermatitis) to a severe systemic reaction (anaphylactic reaction), which can result in death. 6. Death: In general, any medical intervention can result in death, most of the time due to an unforeseen complication. ____________________________________________________________________________________________  Epidural Steroid Injection Patient Information  Description: The epidural space surrounds the nerves as they exit the spinal cord.  In some patients, the nerves can be compressed and inflamed by a bulging disc or a tight spinal canal (spinal stenosis).  By injecting steroids into the epidural space, we can bring irritated nerves into direct contact with a potentially helpful medication.  These steroids act directly on the irritated nerves and can reduce swelling and inflammation which often leads to decreased pain.  Epidural steroids may be injected anywhere along the spine and from the neck to the low back depending upon the location of your pain.   After numbing the skin with local anesthetic (like Novocaine), a small needle is passed into the epidural space slowly.  You may experience a sensation of pressure while this is being done.  The entire block usually last less than 10 minutes.  Conditions which may be treated by epidural steroids:   Low back and leg pain  Neck and arm pain  Spinal stenosis  Post-laminectomy  syndrome  Herpes zoster (shingles) pain  Pain from compression fractures  Preparation for the injection:  1. Do not eat any solid food or dairy products within 8 hours of your appointment.  2. You may drink clear liquids up to 3 hours before appointment.  Clear liquids include water, black coffee, juice or soda.  No milk or cream please. 3. You may take your regular medication, including pain medications, with a sip of water before your appointment  Diabetics should hold regular insulin (if taken separately) and take 1/2 normal NPH dos the morning of the procedure.  Carry some sugar containing items with you to your appointment. 4. A driver must accompany you and be prepared to drive you home after your procedure.  5. Bring all your current medications with your. 6. An IV may be inserted and sedation may be given at the discretion of the physician.   7. A blood pressure cuff, EKG and other monitors will often be applied during the procedure.  Some patients may need to have extra oxygen administered for a short period. 8. You will be asked to provide medical information, including your allergies, prior to the procedure.  We must know immediately if you are taking blood thinners (like Coumadin/Warfarin)  Or if you are allergic to IV iodine contrast (dye). We must know if you could possible be pregnant.  Possible side-effects:  Bleeding from needle site  Infection (rare, may require surgery)  Nerve injury (rare)  Numbness & tingling (temporary)  Difficulty urinating (rare, temporary)  Spinal headache ( a headache worse with upright posture)  Light -headedness (temporary)  Pain at injection site (several   days)  Decreased blood pressure (temporary)  Weakness in arm/leg (temporary)  Pressure sensation in back/neck (temporary)  Call if you experience:  Fever/chills associated with headache or increased back/neck pain.  Headache worsened by an upright position.  New onset  weakness or numbness of an extremity below the injection site  Hives or difficulty breathing (go to the emergency room)  Inflammation or drainage at the infection site  Severe back/neck pain  Any new symptoms which are concerning to you  Please note:  Although the local anesthetic injected can often make your back or neck feel good for several hours after the injection, the pain will likely return.  It takes 3-7 days for steroids to work in the epidural space.  You may not notice any pain relief for at least that one week.  If effective, we will often do a series of three injections spaced 3-6 weeks apart to maximally decrease your pain.  After the initial series, we generally will wait several months before considering a repeat injection of the same type.  If you have any questions, please call [phone] [facility] 

## 2018-01-26 LAB — TOXASSURE SELECT 13 (MW), URINE

## 2018-02-03 ENCOUNTER — Ambulatory Visit: Payer: BLUE CROSS/BLUE SHIELD | Admitting: Nurse Practitioner

## 2018-02-09 ENCOUNTER — Ambulatory Visit (HOSPITAL_BASED_OUTPATIENT_CLINIC_OR_DEPARTMENT_OTHER): Payer: BLUE CROSS/BLUE SHIELD | Admitting: Student in an Organized Health Care Education/Training Program

## 2018-02-09 ENCOUNTER — Encounter: Payer: Self-pay | Admitting: Student in an Organized Health Care Education/Training Program

## 2018-02-09 ENCOUNTER — Other Ambulatory Visit: Payer: Self-pay

## 2018-02-09 ENCOUNTER — Ambulatory Visit: Admission: RE | Admit: 2018-02-09 | Payer: BLUE CROSS/BLUE SHIELD | Source: Ambulatory Visit

## 2018-02-09 ENCOUNTER — Ambulatory Visit
Admission: RE | Admit: 2018-02-09 | Discharge: 2018-02-09 | Disposition: A | Discharge status: Home / Self Care | Payer: BLUE CROSS/BLUE SHIELD | Source: Ambulatory Visit | Attending: Student in an Organized Health Care Education/Training Program | Admitting: Student in an Organized Health Care Education/Training Program

## 2018-02-09 VITALS — BP 116/77 | HR 73 | Temp 98.4°F | Resp 18 | Ht 73.0 in | Wt 217.0 lb

## 2018-02-09 DIAGNOSIS — M545 Low back pain: Secondary | ICD-10-CM | POA: Diagnosis present

## 2018-02-09 DIAGNOSIS — M25561 Pain in right knee: Secondary | ICD-10-CM | POA: Insufficient documentation

## 2018-02-09 DIAGNOSIS — M5416 Radiculopathy, lumbar region: Secondary | ICD-10-CM | POA: Diagnosis not present

## 2018-02-09 MED ORDER — SODIUM CHLORIDE 0.9 % IJ SOLN
INTRAMUSCULAR | Status: AC
  Filled 2018-02-09: qty 10

## 2018-02-09 MED ORDER — LIDOCAINE HCL (PF) 1 % IJ SOLN
INTRAMUSCULAR | Status: AC
  Filled 2018-02-09: qty 5

## 2018-02-09 MED ORDER — DEXAMETHASONE SODIUM PHOSPHATE 10 MG/ML IJ SOLN
INTRAMUSCULAR | Status: AC
  Filled 2018-02-09: qty 1

## 2018-02-09 MED ORDER — ROPIVACAINE HCL 2 MG/ML IJ SOLN
INTRAMUSCULAR | Status: AC
  Filled 2018-02-09: qty 10

## 2018-02-09 MED ORDER — IOPAMIDOL (ISOVUE-M 200) INJECTION 41%
10.0000 mL | Freq: Once | INTRAMUSCULAR | Status: AC
  Administered 2018-02-09: 10 mL via EPIDURAL

## 2018-02-09 MED ORDER — ROPIVACAINE HCL 2 MG/ML IJ SOLN
2.0000 mL | Freq: Once | INTRAMUSCULAR | Status: AC
  Administered 2018-02-09: 10 mL via EPIDURAL

## 2018-02-09 MED ORDER — IOPAMIDOL (ISOVUE-M 200) INJECTION 41%
INTRAMUSCULAR | Status: AC
  Filled 2018-02-09: qty 10

## 2018-02-09 MED ORDER — LIDOCAINE HCL (PF) 1 % IJ SOLN
4.5000 mL | Freq: Once | INTRAMUSCULAR | Status: AC
  Administered 2018-02-09: 5 mL

## 2018-02-09 MED ORDER — DEXAMETHASONE SODIUM PHOSPHATE 10 MG/ML IJ SOLN
10.0000 mg | Freq: Once | INTRAMUSCULAR | Status: AC
  Administered 2018-02-09: 10 mg

## 2018-02-09 MED ORDER — SODIUM CHLORIDE 0.9% FLUSH
2.0000 mL | Freq: Once | INTRAVENOUS | Status: AC
  Administered 2018-02-09: 10 mL

## 2018-02-09 NOTE — Progress Notes (Signed)
Patient's Name: Anthony Nguyen  MRN: [id number]  Referring Provider: Mikey College, *  DOB: 08-23-1956  PCP: Mikey College, NP  DOS: 02/09/2018  Note by: [name], MD  Service setting: Ambulatory outpatient  Specialty: Interventional Pain Management  Patient type: Established  Location: [facility] (AMB) Pain Management Facility  Visit type: Interventional Procedure   Primary Reason for Visit: Interventional Pain Management Treatment. CC: Back Pain (low); Pain (right); and Knee Pain (right)  Procedure:       Anesthesia, Analgesia, Anxiolysis:  Type: Therapeutic Inter-Laminar Epidural Steroid Injection #2  Region: Lumbar Level: L4-5 Level. Laterality: Midline         Type: Local Anesthesia Indication(s): Analgesia and Anxiety Route: Infiltration ([place]/IM) IV Access: Declined Sedation: Declined  Local Anesthetic: Lidocaine 1%   Indications: 1. Lumbar radiculopathy    Pain Score: Pre-procedure: 4 /10 Post-procedure: 0-No pain/10  Pre-op Assessment:  Anthony Nguyen is a 62 y.o. (year old), male patient, seen today for interventional treatment. 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 Anthony Nguyen has a current medication list which includes the following prescription(s): cetirizine, diclofenac sodium, fluticasone, oxycodone-acetaminophen, aspirin, escitalopram, and melatonin. His primarily concern today is the Back Pain (low); Pain (right); and Knee Pain (right)  Initial Vital Signs:  Pulse Rate: 73 Temp: 98.4 F (36.9 C) Resp: 18 BP: (!) 139/95 SpO2: 97 %  BMI: Estimated body mass index is 28.63 kg/m as calculated from the following:   Height as of this encounter: 6\' 1"  (1.854 m).   Weight as of this encounter: 217 lb (98.4 kg).  Risk Assessment: Allergies: Reviewed. He has No Known Allergies.  Allergy Precautions:  None required Coagulopathies: Reviewed. None identified.  Blood-thinner therapy: None at this time Active Infection(s): Reviewed. None identified. Anthony Nguyen is afebrile  Site Confirmation: Anthony Nguyen was asked to confirm the procedure and laterality before marking the site Procedure checklist: Completed Consent: Before the procedure and under the influence of no sedative(s), amnesic(s), or anxiolytics, the patient was informed of the treatment options, risks and possible complications. To fulfill our ethical and legal obligations, as recommended by the American Medical Association's Code of Ethics, I have informed the patient of my clinical impression; the nature and purpose of the treatment or procedure; the risks, benefits, and possible complications of the intervention; the alternatives, including doing nothing; the risk(s) and benefit(s) of the alternative treatment(s) or procedure(s); and the risk(s) and benefit(s) of doing nothing. The patient was provided information about the general risks and possible complications associated with the procedure. These may include, but are not limited to: failure to achieve desired goals, infection, bleeding, organ or nerve damage, allergic reactions, paralysis, and death. In addition, the patient was informed of those risks and complications associated to Spine-related procedures, such as failure to decrease pain; infection (i.e.: Meningitis, epidural or intraspinal abscess); bleeding (i.e.: epidural hematoma, subarachnoid hemorrhage, or any other type of intraspinal or peri-dural bleeding); organ or nerve damage (i.e.: Any type of peripheral nerve, nerve root, or spinal cord injury) with subsequent damage to sensory, motor, and/or autonomic systems, resulting in permanent pain, numbness, and/or weakness of one or several areas of the body; allergic reactions; (i.e.: anaphylactic reaction); and/or death. Furthermore, the patient was informed of those  risks and complications associated with the medications. These include, but are not limited to: allergic reactions (i.e.: anaphylactic or anaphylactoid reaction(s)); adrenal axis suppression; blood  sugar elevation that in diabetics may result in ketoacidosis or comma; water retention that in patients with history of congestive heart failure may result in shortness of breath, pulmonary edema, and decompensation with resultant heart failure; weight gain; swelling or edema; medication-induced neural toxicity; particulate matter embolism and blood vessel occlusion with resultant organ, and/or nervous system infarction; and/or aseptic necrosis of one or more joints. Finally, the patient was informed that Medicine is not an exact science; therefore, there is also the possibility of unforeseen or unpredictable risks and/or possible complications that may result in a catastrophic outcome. The patient indicated having understood very clearly. We have given the patient no guarantees and we have made no promises. Enough time was given to the patient to ask questions, all of which were answered to the patient's satisfaction. Anthony Nguyen has indicated that he wanted to continue with the procedure. Attestation: I, the ordering provider, attest that I have discussed with the patient the benefits, risks, side-effects, alternatives, likelihood of achieving goals, and potential problems during recovery for the procedure that I have provided informed consent. Date  Time:   Pre-Procedure Preparation:  Monitoring: As per clinic protocol. Respiration, ETCO2, SpO2, BP, heart rate and rhythm monitor placed and checked for adequate function Safety Precautions: Patient was assessed for positional comfort and pressure points before starting the procedure. Time-out: I initiated and conducted the "Time-out" before starting the procedure, as per protocol. The patient was asked to participate by confirming the accuracy of the "Time  Out" information. Verification of the correct person, site, and procedure were performed and confirmed by me, the nursing staff, and the patient. "Time-out" conducted as per Joint Commission's Universal Protocol (UP.01.01.01). Time: 0947  Description of Procedure:       Position: Prone with head of the table was raised to facilitate breathing. Target Area: The interlaminar space, initially targeting the lower laminar border of the superior vertebral body. Approach: Paramedial approach. Area Prepped: Entire Posterior Lumbar Region Prepping solution: ChloraPrep (2% chlorhexidine gluconate and 70% isopropyl alcohol) Safety Precautions: Aspiration looking for blood return was conducted prior to all injections. At no point did we inject any substances, as a needle was being advanced. No attempts were made at seeking any paresthesias. Safe injection practices and needle disposal techniques used. Medications properly checked for expiration dates. SDV (single dose vial) medications used. Description of the Procedure: Protocol guidelines were followed. The procedure needle was introduced through the skin, ipsilateral to the reported pain, and advanced to the target area. Bone was contacted and the needle walked caudad, until the lamina was cleared. The epidural space was identified using "loss-of-resistance technique" with 2-3 ml of PF-NaCl (0.9% NSS), in a 5cc LOR glass syringe. Vitals:   02/09/18 0922 02/09/18 0948 02/09/18 0952 02/09/18 0955  BP: (!) 139/95 123/72 130/73 116/77  Pulse: 73     Resp: 18 17 17 18   Temp: 98.4 F (36.9 C)     TempSrc: Oral     SpO2: 97% 97% 97%   Weight: 217 lb (98.4 kg)     Height: 6\' 1"  (1.854 m)       Start Time: 0948 hrs. End Time: 0954 hrs. Materials:  Needle(s) Type: Epidural needle Gauge: 17G Length: 3.5-in Medication(s): Please see orders for medications and dosing details. 8 CC solution made of 5 cc of preservative-free saline, 2 cc of 0.2% ropivacaine, 1  cc of Decadron 10 mg/cc Imaging Guidance (Spinal):  Type of Imaging Technique: Fluoroscopy Guidance (Spinal) Indication(s): Assistance in needle guidance  and placement for procedures requiring needle placement in or near specific anatomical locations not easily accessible without such assistance. Exposure Time: Please see nurses notes. Contrast: Before injecting any contrast, we confirmed that the patient did not have an allergy to iodine, shellfish, or radiological contrast. Once satisfactory needle placement was completed at the desired level, radiological contrast was injected. Contrast injected under live fluoroscopy. No contrast complications. See chart for type and volume of contrast used. Fluoroscopic Guidance: I was personally present during the use of fluoroscopy. "Tunnel Vision Technique" used to obtain the best possible view of the target area. Parallax error corrected before commencing the procedure. "Direction-depth-direction" technique used to introduce the needle under continuous pulsed fluoroscopy. Once target was reached, antero-posterior, oblique, and lateral fluoroscopic projection used confirm needle placement in all planes. Images permanently stored in EMR. Interpretation: I personally interpreted the imaging intraoperatively. Adequate needle placement confirmed in multiple planes. Appropriate spread of contrast into desired area was observed. No evidence of afferent or efferent intravascular uptake. No intrathecal or subarachnoid spread observed. Permanent images saved into the patient's record.  Antibiotic Prophylaxis:   Anti-infectives (From admission, onward)   None     Indication(s): None identified  Post-operative Assessment:  Post-procedure Vital Signs:  Pulse Rate: 73 Temp: 98.4 F (36.9 C) Resp: 18 BP: 116/77 SpO2: 97 %  EBL: None  Complications: No immediate post-treatment complications observed by team, or reported by patient.  Note: The patient tolerated  the entire procedure well. A repeat set of vitals were taken after the procedure and the patient was kept under observation following institutional policy, for this type of procedure. Post-procedural neurological assessment was performed, showing return to baseline, prior to discharge. The patient was provided with post-procedure discharge instructions, including a section on how to identify potential problems. Should any problems arise concerning this procedure, the patient was given instructions to immediately contact us, at any time, without hesitation. In any case, we plan to contact the patient by telephone for a follow-up status report regarding this interventional procedure.  Comments:  No additional relevant information. 5 out of 5 strength bilateral lower extremity: Plantar flexion, dorsiflexion, knee flexion, knee extension.  Plan of Care    Imaging Orders     DG C-Arm 1-60 Min-No Report Procedure Orders    No procedure(s) ordered today    Medications ordered for procedure: Meds ordered this encounter  Medications  . iopamidol (ISOVUE-M) 41 % intrathecal injection 10 mL  . ropivacaine (PF) 2 mg/mL (0.2%) (NAROPIN) injection 2 mL  . sodium chloride flush (NS) 0.9 % injection 2 mL  . lidocaine (PF) (XYLOCAINE) 1 % injection 4.5 mL  . dexamethasone (DECADRON) injection 10 mg   Medications administered: We administered iopamidol, ropivacaine (PF) 2 mg/mL (0.2%), sodium chloride flush, lidocaine (PF), and dexamethasone.  See the medical record for exact dosing, route, and time of administration.  New Prescriptions   No medications on file   Disposition: Discharge home  Discharge Date & Time: 02/09/2018; 1000 hrs.   Physician-requested Follow-up: No follow-ups on file.  Future Appointments  Date Time Provider [place]  02/25/2018  8:00 AM Mikey College, NP [facility] None  03/17/2018  8:30 AM [name], MD [facility] None   Primary Care Physician: Mikey College, NP Location: [facility] Outpatient Pain Management Facility Note by: [name], MD Date: 02/09/2018; Time: 9:59 AM  Disclaimer:  Medicine is not an exact science. The only guarantee in medicine is that nothing is guaranteed. It is important  to note that the decision to proceed with this intervention was based on the information collected from the patient. The Data and conclusions were drawn from the patient's questionnaire, the interview, and the physical examination. Because the information was provided in large part by the patient, it cannot be guaranteed that it has not been purposely or unconsciously manipulated. Every effort has been made to obtain as much relevant data as possible for this evaluation. It is important to note that the conclusions that lead to this procedure are derived in large part from the available data. Always take into account that the treatment will also be dependent on availability of resources and existing treatment guidelines, considered by other Pain Management Practitioners as being common knowledge and practice, at the time of the intervention. For Medico-Legal purposes, it is also important to point out that variation in procedural techniques and pharmacological choices are the acceptable norm. The indications, contraindications, technique, and results of the above procedure should only be interpreted and judged by a Board-Certified Interventional Pain Specialist with extensive familiarity and expertise in the same exact procedure and technique.

## 2018-02-09 NOTE — Progress Notes (Signed)
Safety precautions to be maintained throughout the outpatient stay will include: orient to surroundings, keep bed in low position, maintain call bell within reach at all times, provide assistance with transfer out of bed and ambulation.  

## 2018-02-09 NOTE — Patient Instructions (Signed)
Pain Management Discharge Instructions  General Discharge Instructions :  If you need to reach your doctor call: Monday-Friday 8:00 am - 4:00 pm at [phone] or toll free [phone].  After clinic hours [phone] to have operator reach doctor.  Bring all of your medication bottles to all your appointments in the pain clinic.  To cancel or reschedule your appointment with Pain Management please remember to call 24 hours in advance to avoid a fee.  Refer to the educational materials which you have been given on: General Risks, I had my Procedure. Discharge Instructions, Post Sedation.  Post Procedure Instructions:  Please notify your doctor immediately if you have any unusual bleeding, trouble breathing or pain that is not related to your normal pain.  Depending on the type of procedure that was done, some parts of your body may feel week and/or numb.  This usually clears up by tonight or the next day.  Walk with the use of an assistive device or accompanied by an adult for the 24 hours.  You may use ice on the affected area for the first 24 hours.  Put ice in a Ziploc bag and cover with a towel and place against area 15 minutes on 15 minutes off.  You may switch to heat after 24 hours. 

## 2018-02-25 ENCOUNTER — Ambulatory Visit: Payer: BLUE CROSS/BLUE SHIELD | Admitting: Nurse Practitioner

## 2018-03-10 ENCOUNTER — Other Ambulatory Visit: Payer: Self-pay

## 2018-03-10 ENCOUNTER — Ambulatory Visit (INDEPENDENT_AMBULATORY_CARE_PROVIDER_SITE_OTHER): Payer: BLUE CROSS/BLUE SHIELD | Admitting: Nurse Practitioner

## 2018-03-10 ENCOUNTER — Encounter: Payer: Self-pay | Admitting: Nurse Practitioner

## 2018-03-10 ENCOUNTER — Telehealth: Payer: Self-pay | Admitting: Student in an Organized Health Care Education/Training Program

## 2018-03-10 VITALS — BP 137/81 | HR 91 | Temp 98.7°F | Ht 73.0 in | Wt 232.6 lb

## 2018-03-10 DIAGNOSIS — M25562 Pain in left knee: Secondary | ICD-10-CM | POA: Diagnosis not present

## 2018-03-10 DIAGNOSIS — G8929 Other chronic pain: Secondary | ICD-10-CM

## 2018-03-10 DIAGNOSIS — R109 Unspecified abdominal pain: Secondary | ICD-10-CM | POA: Diagnosis not present

## 2018-03-10 MED ORDER — DICLOFENAC SODIUM 3 % TD GEL: 1.0000 "application " | Freq: Three times a day (TID) | TRANSDERMAL | 2 refills | Status: DC | PRN

## 2018-03-10 MED ORDER — TIZANIDINE HCL 4 MG PO TABS: 4.0000 mg | ORAL_TABLET | Freq: Three times a day (TID) | ORAL | 0 refills | Status: DC | PRN

## 2018-03-10 MED ORDER — DICLOFENAC SODIUM 1 % TD GEL: 4.0000 g | Freq: Four times a day (QID) | TRANSDERMAL | 2 refills | Status: DC | PRN
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
## 2018-03-10 NOTE — Patient Instructions (Addendum)
Early Chars Arismendez,   Thank you for coming in to clinic today.  1. You have a Thoracic and lumbar spine muscle strain.  - Start taking Tylenol extra strength 1 to 2 tablets every 6-8 hours for aches or fever/chills for next few days as needed.  Do not take more than 3,000 mg in 24 hours from all medicines.  May take Ibuprofen as well if tolerated 200-400mg  every 8 hours as needed. May alternate tylenol and ibuprofen in same day. - Use heat and ice.  Apply this for 15 minutes at a time 6-8 times per day.   - Muscle rub with lidocaine, lidocaine patch, Biofreeze, or tiger balm for topical pain relief.  Avoid using this with heat and ice to avoid burns. - START muscle relaxer tizanidine 4 mg one tablet up to three times daily.  This can cause drowsiness, so use caution.  It may be best to only take this at night for helping you during sleep.    Please schedule a follow-up appointment with Cassell Smiles, AGNP. Return if symptoms worsen or fail to improve, for AND with Dr. Holley Raring on 03/17/2018.  If you have any other questions or concerns, please feel free to call the clinic or send a message through [place]. You may also schedule an earlier appointment if necessary.  You will receive a survey after today's visit either digitally by e-mail or paper by [employer]. Your experiences and feedback matter to [place].  Please respond so we know how we are doing as we provide care for you.   Cassell Smiles, DNP, AGNP-BC Adult Gerontology Nurse Practitioner [place]

## 2018-03-10 NOTE — Telephone Encounter (Signed)
Patient fell backward and back is hurting wants to know what to do, advised to go to pcp or urgent care

## 2018-03-14 ENCOUNTER — Other Ambulatory Visit: Payer: Self-pay

## 2018-03-14 ENCOUNTER — Emergency Department
Admission: EM | Admit: 2018-03-14 | Discharge: 2018-03-14 | Disposition: A | Discharge status: Home / Self Care | Payer: BLUE CROSS/BLUE SHIELD | Attending: Emergency Medicine | Admitting: Emergency Medicine

## 2018-03-14 ENCOUNTER — Emergency Department: Payer: BLUE CROSS/BLUE SHIELD

## 2018-03-14 ENCOUNTER — Encounter: Payer: Self-pay | Admitting: Emergency Medicine

## 2018-03-14 DIAGNOSIS — Y929 Unspecified place or not applicable: Secondary | ICD-10-CM | POA: Insufficient documentation

## 2018-03-14 DIAGNOSIS — Y999 Unspecified external cause status: Secondary | ICD-10-CM | POA: Insufficient documentation

## 2018-03-14 DIAGNOSIS — F17210 Nicotine dependence, cigarettes, uncomplicated: Secondary | ICD-10-CM | POA: Diagnosis not present

## 2018-03-14 DIAGNOSIS — Z96652 Presence of left artificial knee joint: Secondary | ICD-10-CM | POA: Diagnosis not present

## 2018-03-14 DIAGNOSIS — Z85820 Personal history of malignant melanoma of skin: Secondary | ICD-10-CM | POA: Insufficient documentation

## 2018-03-14 DIAGNOSIS — Z7982 Long term (current) use of aspirin: Secondary | ICD-10-CM | POA: Insufficient documentation

## 2018-03-14 DIAGNOSIS — Z79899 Other long term (current) drug therapy: Secondary | ICD-10-CM | POA: Diagnosis not present

## 2018-03-14 DIAGNOSIS — S3992XA Unspecified injury of lower back, initial encounter: Secondary | ICD-10-CM | POA: Diagnosis present

## 2018-03-14 DIAGNOSIS — Y939 Activity, unspecified: Secondary | ICD-10-CM | POA: Insufficient documentation

## 2018-03-14 DIAGNOSIS — W07XXXA Fall from chair, initial encounter: Secondary | ICD-10-CM | POA: Insufficient documentation

## 2018-03-14 DIAGNOSIS — S32009A Unspecified fracture of unspecified lumbar vertebra, initial encounter for closed fracture: Secondary | ICD-10-CM | POA: Insufficient documentation

## 2018-03-14 MED ORDER — OXYCODONE-ACETAMINOPHEN 5-325 MG PO TABS: 1.0000 | ORAL_TABLET | Freq: Three times a day (TID) | ORAL | 0 refills | Status: DC | PRN

## 2018-03-14 NOTE — ED Triage Notes (Addendum)
Has chronic back pain and little over week ago fell going to sit in chair and back has flared up.  Denies loss bowel or bladder.  Ambulatory to check in.  Pt reports had shot at pain clinic 3 weeks ago.

## 2018-03-14 NOTE — ED Provider Notes (Signed)
[facility] Emergency Department Provider Note  ____________________________________________   First MD Initiated Contact with Patient 03/14/18 1151     (approximate)  I have reviewed the triage vital signs and the nursing notes.   HISTORY  Chief Complaint Back Pain    HPI Anthony Nguyen is a 62 y.o. male since emergency department complaining of increased low back pain.  States he took a fall 1 to 2 weeks ago when he was in a chair that reclines back and he jerked to the right falling onto the floor.  He is concerned because he has no pain clinic appointment on 03/17/2018 but he states the pain is gotten so bad he just does not feel like he can wait.  He does not want to upset his pain contract with the pain clinic however he feels like he needs to be evaluated.  He denies any new numbness or tingling.  He denies chest pain or shortness of breath.  Past Medical History:  Diagnosis Date  . Allergy   . Arthritis    osteoarthritis  . Cancer ([place])    melanoma skin cancer face  . Current every day smoker   . Diverticulosis   . GERD (gastroesophageal reflux disease)   . H/O diverticulitis of colon   . Restless leg syndrome   . Sciatic pain     Patient Active Problem List   Diagnosis Date Noted  . Lumbar radiculopathy 12/29/2017  . Lumbar degenerative disc disease 12/29/2017  . Lumbar foraminal stenosis 12/29/2017  . History of left knee replacement 12/29/2017  . Primary osteoarthritis of both knees 12/29/2017  . Chronic pain syndrome 12/29/2017  . Primary osteoarthritis of left knee 10/14/2017  . Chronic nasal congestion 11/24/2013  . Benign paroxysmal positional vertigo 11/24/2013  . Sleep disturbance 08/17/2013  . Allergy 08/17/2013  . GERD (gastroesophageal reflux disease) 08/17/2013  . Tobacco abuse counseling 08/17/2013  . Ringworm 08/17/2013    Past Surgical History:  Procedure Laterality Date  . JOINT REPLACEMENT Left 10/14/2018    knee  . skin cancer removal     face  . TONSILLECTOMY AND ADENOIDECTOMY    . TOTAL KNEE ARTHROPLASTY Left 10/14/2017   Procedure: TOTAL KNEE ARTHROPLASTY;  Surgeon: Hessie Knows, MD;  Location: [facility] ORS;  Service: Orthopedics;  Laterality: Left;    Prior to Admission medications   Medication Sig Start Date End Date Taking? Authorizing Provider  aspirin 81 MG chewable tablet Chew by mouth daily.    [provider]  cetirizine (ZYRTEC) 10 MG tablet Take 1 tablet (10 mg total) by mouth daily. 12/23/17   Mikey College, NP  diclofenac sodium (VOLTAREN) 1 % GEL Apply 4 g topically 4 (four) times daily as needed (left knee and back pain). 03/10/18   Mikey College, NP  Diclofenac Sodium 3 % GEL Place 1 application onto the skin 3 (three) times daily as needed (left knee pain). 03/10/18   Mikey College, NP  escitalopram (LEXAPRO) 10 MG tablet Take 10 mg by mouth daily.    [provider]  fluticasone (FLONASE) 50 MCG/ACT nasal spray Place 2 sprays into both nostrils daily. 12/23/17   Mikey College, NP  Melatonin 1 MG TABS Take 1 tablet by mouth at bedtime as needed.    [provider]  oxyCODONE-acetaminophen (PERCOCET) 5-325 MG tablet Take 1 tablet by mouth every 8 (eight) hours as needed for severe pain. 03/14/18   Caryn Section Linden Dolin, PA-C  tiZANidine (ZANAFLEX) 4  MG tablet Take 1 tablet (4 mg total) by mouth every 8 (eight) hours as needed for up to 8 days for muscle spasms. 03/10/18 03/18/18  Mikey College, NP    Allergies Patient has no known allergies.  Family History  Problem Relation Age of Onset  . Hypertension Mother   . Alzheimer's disease Father   . Cancer Sister 16       breast cancer  . Diabetes Sister   . Heart disease Brother        electrical pathways - ongoing workup  . Healthy Brother   . Healthy Son   . Healthy Son   . Healthy Son   . Healthy Son     Social History Social History   Tobacco Use  . Smoking  status: Current Every Day Smoker    Packs/day: 1.00    Years: 30.00    Pack years: 30.00    Types: Cigarettes  . Smokeless tobacco: Never Used  Substance Use Topics  . Alcohol use: No    Frequency: Never  . Drug use: No    Review of Systems  Constitutional: No fever/chills Eyes: No visual changes. ENT: No sore throat. Respiratory: Denies cough Genitourinary: Negative for dysuria. Musculoskeletal: Positive for back pain. Skin: Negative for rash.    ____________________________________________   PHYSICAL EXAM:  VITAL SIGNS: ED Triage Vitals  Enc Vitals Group     BP 03/14/18 1107 (!) 162/81     Pulse Rate 03/14/18 1107 84     Resp 03/14/18 1107 16     Temp 03/14/18 1107 97.9 F (36.6 C)     Temp Source 03/14/18 1107 Oral     SpO2 03/14/18 1107 96 %     Weight 03/14/18 1102 232 lb (105.2 kg)     Height 03/14/18 1102 6\' 1"  (1.854 m)     Head Circumference --      Peak Flow --      Pain Score 03/14/18 1102 8     Pain Loc --      Pain Edu? --      Excl. in [place]? --     Constitutional: Alert and oriented. Well appearing and in no acute distress. Eyes: Conjunctivae are normal.  Head: Atraumatic. Nose: No congestion/rhinnorhea. Mouth/Throat: Mucous membranes are moist.   Cardiovascular: Normal rate, regular rhythm. Respiratory: Normal respiratory effort.  No retractions GU: deferred Musculoskeletal: FROM all extremities, warm and well perfused.  The lumbar spine is tender to palpation.  No bruising is noted posteriorly. Neurologic:  Normal speech and language.  Patient is able to lift both great toes without difficulty Skin:  Skin is warm, dry and intact. No rash noted. Psychiatric: Mood and affect are normal. Speech and behavior are normal.  ____________________________________________   LABS (all labs ordered are listed, but only abnormal results are displayed)  Labs Reviewed - No data to  display ____________________________________________   ____________________________________________  RADIOLOGY  CT of the lumbar spine shows new acute fractures of the transverse process of the lumbar spine at L1-2 and 3.  These are on the right side.  ____________________________________________   PROCEDURES  Procedure(s) performed: No  Procedures    ____________________________________________   INITIAL IMPRESSION / ASSESSMENT AND PLAN / ED COURSE  Pertinent labs & imaging results that were available during my care of the patient were reviewed by me and considered in my medical decision making (see chart for details).  Patient is a 62 year old male presents emergency department complaining of increasing lower  back pain.  He states he took a fall about 1 to 2 weeks ago.  He states pain is been increasing since then.  Does have a history of chronic back pain has been evaluated at the pain clinic.  However he is concerned as this is new.  He is afraid he is done something new to his back.  On physical exam lumbar spine is tender to palpation.  Remainder the exam is unremarkable.  CT of the lumbar spine shows transverse process fractures of the right side at L1-2 and 3.  Explained the findings to the patient.  Explained that this is a new an acute finding so if we give him pain medication today this should not delete his pain contract.  He is appreciative and states he understands.  The pharmacy called and was also instructed of this being an acute fracture therefore needing additional pain medication.  He is to continue to follow-up with the pain clinic.  He states he understands and will comply.  He was discharged in stable condition     As part of my medical decision making, I reviewed the following data within the [place] notes reviewed and incorporated, Old chart reviewed, Radiograph reviewed CT of the lumbar spine shows no acute fracture, Notes  from prior ED visits and  Controlled Substance Database  ____________________________________________   FINAL CLINICAL IMPRESSION(S) / ED DIAGNOSES  Final diagnoses:  Fracture of transverse process of lumbar vertebra, closed, initial encounter [facility])      NEW MEDICATIONS STARTED DURING THIS VISIT:  Discharge Medication List as of 03/14/2018  1:02 PM       Note:  This document was prepared using Dragon voice recognition software and may include unintentional dictation errors.    Versie Starks, PA-C 03/14/18 1544    Arta Silence, MD 03/14/18 [phone]

## 2018-03-14 NOTE — Discharge Instructions (Addendum)
Follow-up with the pain clinic for your already scheduled appointment.  Return emergency department if worsening.  Please let them know you are given additional medication due to the acute fracture of your lower back.  This should not invalidate your pain contract with him.

## 2018-03-14 NOTE — ED Notes (Signed)
See triage note  Presents with lower back pain  States he took a fall about 1-2 weeks ago   And then took a sudden turn while sitting    States pain is worse  He goes to pain clinic and has appt on 5/30   Ambulates slowly d/t pain  Increased pain when getting up from sitting position

## 2018-03-14 NOTE — ED Notes (Signed)
First Nurse Note:  Patient ambulatory to ED complaining of back pain after a fall.  Patient is a patient of the pain clinic.

## 2018-03-15 ENCOUNTER — Telehealth: Payer: Self-pay | Admitting: Student in an Organized Health Care Education/Training Program

## 2018-03-15 NOTE — Telephone Encounter (Signed)
Patient wants to speak with Dr. Holley Raring about pain, he fell in a chair last week and cannot get any relief. He is scheduled on Thurs. 03-17-18

## 2018-03-15 NOTE — Telephone Encounter (Signed)
Called patient to let him know that Dr Holley Raring will see him at his regularly scheduled appt on 03/17/18.

## 2018-03-15 NOTE — Telephone Encounter (Signed)
Called back to the patient to find out what was going on, he was seen in the ED after a fall approx 1 - 2 weeks ago.  They did imaging and determined that there were fx's in the lumbar region L1,2,3.  He was given oxycodone- apap 5-325 mg for the pain.  Denies any pain relief at this time.  Would like to talk with Dr Holley Raring to see what we can do to help with the pain.

## 2018-03-17 ENCOUNTER — Encounter: Payer: Self-pay | Admitting: Student in an Organized Health Care Education/Training Program

## 2018-03-17 ENCOUNTER — Ambulatory Visit
Payer: BLUE CROSS/BLUE SHIELD | Attending: Student in an Organized Health Care Education/Training Program | Admitting: Student in an Organized Health Care Education/Training Program

## 2018-03-17 ENCOUNTER — Other Ambulatory Visit: Payer: Self-pay

## 2018-03-17 VITALS — BP 135/87 | HR 77 | Temp 98.4°F | Resp 16 | Ht 74.0 in | Wt 217.0 lb

## 2018-03-17 DIAGNOSIS — M5416 Radiculopathy, lumbar region: Secondary | ICD-10-CM | POA: Diagnosis not present

## 2018-03-17 DIAGNOSIS — G479 Sleep disorder, unspecified: Secondary | ICD-10-CM | POA: Diagnosis not present

## 2018-03-17 DIAGNOSIS — F17210 Nicotine dependence, cigarettes, uncomplicated: Secondary | ICD-10-CM | POA: Diagnosis not present

## 2018-03-17 DIAGNOSIS — S32009D Unspecified fracture of unspecified lumbar vertebra, subsequent encounter for fracture with routine healing: Secondary | ICD-10-CM | POA: Diagnosis not present

## 2018-03-17 DIAGNOSIS — Z79899 Other long term (current) drug therapy: Secondary | ICD-10-CM | POA: Insufficient documentation

## 2018-03-17 DIAGNOSIS — M545 Low back pain: Secondary | ICD-10-CM | POA: Insufficient documentation

## 2018-03-17 DIAGNOSIS — M170 Bilateral primary osteoarthritis of knee: Secondary | ICD-10-CM | POA: Insufficient documentation

## 2018-03-17 DIAGNOSIS — M9983 Other biomechanical lesions of lumbar region: Secondary | ICD-10-CM

## 2018-03-17 DIAGNOSIS — M5116 Intervertebral disc disorders with radiculopathy, lumbar region: Secondary | ICD-10-CM | POA: Diagnosis not present

## 2018-03-17 DIAGNOSIS — Z760 Encounter for issue of repeat prescription: Secondary | ICD-10-CM | POA: Diagnosis present

## 2018-03-17 DIAGNOSIS — G894 Chronic pain syndrome: Secondary | ICD-10-CM

## 2018-03-17 DIAGNOSIS — M79605 Pain in left leg: Secondary | ICD-10-CM | POA: Insufficient documentation

## 2018-03-17 DIAGNOSIS — M47816 Spondylosis without myelopathy or radiculopathy, lumbar region: Secondary | ICD-10-CM | POA: Diagnosis not present

## 2018-03-17 DIAGNOSIS — M48061 Spinal stenosis, lumbar region without neurogenic claudication: Secondary | ICD-10-CM | POA: Diagnosis not present

## 2018-03-17 DIAGNOSIS — Z7982 Long term (current) use of aspirin: Secondary | ICD-10-CM | POA: Diagnosis not present

## 2018-03-17 DIAGNOSIS — Z96652 Presence of left artificial knee joint: Secondary | ICD-10-CM | POA: Diagnosis not present

## 2018-03-17 DIAGNOSIS — M5136 Other intervertebral disc degeneration, lumbar region: Secondary | ICD-10-CM

## 2018-03-17 DIAGNOSIS — M79604 Pain in right leg: Secondary | ICD-10-CM | POA: Diagnosis not present

## 2018-03-17 DIAGNOSIS — B359 Dermatophytosis, unspecified: Secondary | ICD-10-CM | POA: Insufficient documentation

## 2018-03-17 DIAGNOSIS — K219 Gastro-esophageal reflux disease without esophagitis: Secondary | ICD-10-CM | POA: Insufficient documentation

## 2018-03-17 DIAGNOSIS — G2581 Restless legs syndrome: Secondary | ICD-10-CM | POA: Diagnosis not present

## 2018-03-17 MED ORDER — OXYCODONE-ACETAMINOPHEN 10-325 MG PO TABS: 1.0000 | ORAL_TABLET | Freq: Three times a day (TID) | ORAL | 0 refills | Status: DC | PRN

## 2018-03-17 NOTE — Patient Instructions (Signed)
Patient will return with pill bottles to get prescriptions

## 2018-03-17 NOTE — Progress Notes (Signed)
Safety precautions to be maintained throughout the outpatient stay will include: orient to surroundings, keep bed in low position, maintain call bell within reach at all times, provide assistance with transfer out of bed and ambulation.   DID NOT BRING PILL BOTTLE.

## 2018-03-17 NOTE — Progress Notes (Signed)
Patient's Name: Anthony Nguyen  MRN: [id number]  Referring Provider: Mikey College, *  DOB: 06/24/1956  PCP: Mikey College, NP  DOS: 03/17/2018  Note by: [name], MD  Service setting: Ambulatory outpatient  Specialty: Interventional Pain Management  Location: [facility] (AMB) Pain Management Facility    Patient type: Established   Primary Reason(s) for Visit: Encounter for prescription drug management. (Level of risk: moderate)  CC: Medication Refill (Oxycodone ) and Back Pain  HPI  [name] is a 62 y.o. year old, male patient, who comes today for a medication management evaluation. He has Sleep disturbance; Allergy; GERD (gastroesophageal reflux disease); Tobacco abuse counseling; Ringworm; Chronic nasal congestion; Benign paroxysmal positional vertigo; Primary osteoarthritis of left knee; Lumbar radiculopathy; Lumbar degenerative disc disease; Lumbar foraminal stenosis; History of left knee replacement; Primary osteoarthritis of both knees; and Chronic pain syndrome on their problem list. His primarily concern today is the Medication Refill (Oxycodone ) and Back Pain  Pain Assessment: Location: Mid, Right Back Radiating: Mid back pain that radiated to right side and legs bilateral  Onset: More than a month ago Duration: Chronic pain Quality: Aching, Constant Severity: 8 /10 (subjective, self-reported pain score)  Note: Reported level is inconsistent with clinical observations.                         When using our objective Pain Scale, levels between 6 and 10/10 are said to belong in an emergency room, as it progressively worsens from a 6/10, described as severely limiting, requiring emergency care not usually available at an outpatient pain management facility. At a 6/10 level, communication becomes difficult and requires great effort. Assistance to reach the emergency department may be required. Facial flushing and profuse sweating along with potentially dangerous  increases in heart rate and blood pressure will be evident. Effect on ADL: "Bothers with everything" Timing: Constant Modifying factors: Denies  BP: 135/87  HR: 77  [name] was last scheduled for an appointment on 03/15/2018 for medication management. During today's appointment we reviewed Mr. Huntsberry's chronic pain status, as well as his outpatient medication regimen.  Patient presents today for medication management.  His last visit with me was for a lumbar epidural steroid injection performed on 02/09/2018.  In the interim, the patient sustained a fall which prompted him to go to the ED on 03/14/2018.  Patient states that in mid May, he was sitting in a chair that reclines back and that he reclined back to far resulting in a chair falling in the floor along with himself.  CT of the patient's lumbar spine shows new acute fractures of the transverse process of the lumbar spine at L1-L2 and L3 on the right side.  He was given a short supply of Percocet 5 mg, quantity 20 on his ED visit.  The patient  reports that he does not use drugs. His body mass index is 27.86 kg/m.  Further details on both, my assessment(s), as well as the proposed treatment plan, please see below.  Controlled Substance Pharmacotherapy Assessment REMS (Risk Evaluation and Mitigation Strategy)  Analgesic: Percocet 5 mg 3 times daily as needed, quantity 90 MME/day: 22.5 mg/day.  Janne Napoleon, RN  03/17/2018  8:40 AM  Sign at close encounter Safety precautions to be maintained throughout the outpatient stay will include: orient to surroundings, keep bed in low position, maintain call bell within reach at all times, provide assistance with transfer out of bed and ambulation.  DID NOT BRING PILL BOTTLE.    Pharmacokinetics: Liberation and absorption (onset of action): WNL Distribution (time to peak effect): WNL Metabolism and excretion (duration of action): WNL         Pharmacodynamics: Desired  effects: Analgesia: Mr. Marcotte reports >50% benefit. Functional ability: Patient reports that medication allows him to accomplish basic ADLs Clinically meaningful improvement in function (CMIF): Sustained CMIF goals met Perceived effectiveness: Described as relatively effective but with some room for improvement Undesirable effects: Side-effects or Adverse reactions: None reported Monitoring: [place] PMP: Online review of the past 47-monthperiod conducted. Compliant with practice rules and regulations Last UDS on record: Summary  Date Value Ref Range Status  01/20/2018 FINAL  Final    Comment:    ==================================================================== TOXASSURE SELECT 13 (MW) ==================================================================== Test                             Result       Flag       Units Drug Present and Declared for Prescription Verification   Oxycodone                      525          EXPECTED   ng/mg creat   Oxymorphone                    331          EXPECTED   ng/mg creat   Noroxycodone                   2011         EXPECTED   ng/mg creat   Noroxymorphone                 174          EXPECTED   ng/mg creat    Sources of oxycodone are scheduled prescription medications.    Oxymorphone, noroxycodone, and noroxymorphone are expected    metabolites of oxycodone. Oxymorphone is also available as a    scheduled prescription medication. ==================================================================== Test                      Result    Flag   Units      Ref Range   Creatinine              159              mg/dL      >=20 ==================================================================== Declared Medications:  The flagging and interpretation on this report are based on the  following declared medications.  Unexpected results may arise from  inaccuracies in the declared medications.  **Note: The testing scope of this panel includes these  medications:  Oxycodone (Percocet)  **Note: The testing scope of this panel does not include following  reported medications:  Acetaminophen (Percocet)  Aspirin (Aspirin 81)  Diclofenac  Escitalopram (Lexapro)  Fluticasone (Flonase)  Melatonin ==================================================================== For clinical consultation, please call ([phone] ====================================================================    UDS interpretation: Compliant          Medication Assessment Form: Reviewed. Patient indicates being compliant with therapy Treatment compliance: Compliant Risk Assessment Profile: Aberrant behavior: See prior evaluations. None observed or detected today Comorbid factors increasing risk of overdose: See prior notes. No additional risks detected today Risk of substance use disorder (SUD): Low Opioid Risk Tool - 03/17/18 06803  Family History of Substance Abuse   Alcohol  Negative    Illegal Drugs  Negative    Rx Drugs  Negative      Personal History of Substance Abuse   Alcohol  Negative    Illegal Drugs  Negative    Rx Drugs  Negative      Age   Age between 51-45 years   No      History of Preadolescent Sexual Abuse   History of Preadolescent Sexual Abuse  Negative or Male      Psychological Disease   Psychological Disease  Negative    Depression  Negative      Total Score   Opioid Risk Tool Scoring  0    Opioid Risk Interpretation  Low Risk      ORT Scoring interpretation table:  Score <3 = Low Risk for SUD  Score between 4-7 = Moderate Risk for SUD  Score >8 = High Risk for Opioid Abuse   Risk Mitigation Strategies:  Patient Counseling: Covered Patient-Prescriber Agreement (PPA): Present and active  Notification to other healthcare providers: Done  Pharmacologic Plan: Temporary increase patient's Percocet to 10 mg 3 times daily as needed given acute on chronic pain sustained from L1, L2, L3 transverse process fracture.              Laboratory Chemistry  Inflammation Markers (CRP: Acute Phase) (ESR: Chronic Phase) Lab Results  Component Value Date   ESRSEDRATE 30 (H) 09/29/2017                         Rheumatology Markers No results found for: RF, ANA, LABURIC, URICUR, LYMEIGGIGMAB, LYMEABIGMQN, HLAB27                      Renal Function Markers Lab Results  Component Value Date   BUN 13 10/16/2017   CREATININE 1.01 10/16/2017   GFRAA >60 10/16/2017   GFRNONAA >60 10/16/2017                              Hepatic Function Markers Lab Results  Component Value Date   AST 22 12/16/2015   ALT 22 12/16/2015   ALBUMIN 4.7 12/16/2015   ALKPHOS 73 12/16/2015   LIPASE 31 03/17/2014                        Electrolytes Lab Results  Component Value Date   NA 133 (L) 10/16/2017   K 4.0 10/16/2017   CL 98 (L) 10/16/2017   CALCIUM 9.0 10/16/2017                        Neuropathy Markers No results found for: VITAMINB12, FOLATE, HGBA1C, HIV                      Bone Pathology Markers No results found for: VD25OH, VD125OH2TOT, [id number], IA1655VZ4, 25OHVITD1, 25OHVITD2, 25OHVITD3, TESTOFREE, TESTOSTERONE                       Coagulation Parameters Lab Results  Component Value Date   INR 0.96 09/29/2017   LABPROT 12.7 09/29/2017   APTT 31 09/29/2017   PLT 187 10/16/2017                        Cardiovascular Markers  Lab Results  Component Value Date   TROPONINI <0.03 11/16/2014   HGB 12.9 (L) 10/16/2017   HCT 38.8 (L) 10/16/2017                         CA Markers No results found for: CEA, CA125, LABCA2                      Note: Lab results reviewed.  Recent Diagnostic Imaging Results  CT Lumbar Spine Wo Contrast CLINICAL DATA:  Low back pain. Fall 1-2 weeks ago. Right flank pain.  EXAM: CT LUMBAR SPINE WITHOUT CONTRAST  TECHNIQUE: Multidetector CT imaging of the lumbar spine was performed without intravenous contrast administration. Multiplanar CT image reconstructions were  also generated.  COMPARISON:  Lumbar MRI from 02/26/2017  FINDINGS: Segmentation: The lowest lumbar type non-rib-bearing vertebra is labeled as L5.  Alignment: 5 mm anterolisthesis of with chronic bilateral L5 on S1 associated pars defects at L5, similar appearance to 02/26/2017.  Vertebrae: Chronic pars defects at L5 as noted above.  Acute relatively nondisplaced fractures of the right first, second, and third lumbar vertebra transverse processes on the right side. Slight effacement of adjacent fat planes.  Paraspinal and other soft tissues: Aortoiliac atherosclerotic vascular disease.  Disc levels:  L2-3: Moderate central narrowing of the thecal sac due to diffuse disc bulge.  L3-4: Borderline central narrowing of the thecal sac due to a calcified central disc protrusion.  L4-5: No impingement.  Mild disc bulge.  L5-S1: Mild right foraminal stenosis due to disc uncovering and disc bulge.  IMPRESSION: 1. Acute nondisplaced fractures of the right first, second, and third lumbar vertebra transverse processes. No instability. 2. 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 3. Lumbar spondylosis and degenerative disc disease cause moderate impingement at L2-3 and mild impingement at L5-S1, as detailed above.  Electronically Signed   By: Van Clines M.D.   On: 03/14/2018 12:33  Complexity Note: Imaging results reviewed. Results shared with Mr. Forker, using Layman's terms.                         Meds   Current Outpatient Medications:  .  aspirin 81 MG chewable tablet, Chew by mouth daily., Disp: , Rfl:  .  cetirizine (ZYRTEC) 10 MG tablet, Take 1 tablet (10 mg total) by mouth daily., Disp: 30 tablet, Rfl: 11 .  diclofenac sodium (VOLTAREN) 1 % GEL, Apply 4 g topically 4 (four) times daily as needed (left knee and back pain)., Disp: 2 Tube, Rfl: 2 .  Diclofenac Sodium 3 % GEL, Place 1 application onto  the skin 3 (three) times daily as needed (left knee pain)., Disp: 2 Tube, Rfl: 2 .  escitalopram (LEXAPRO) 10 MG tablet, Take 10 mg by mouth daily., Disp: , Rfl:  .  fluticasone (FLONASE) 50 MCG/ACT nasal spray, Place 2 sprays into both nostrils daily., Disp: 16 g, Rfl: 11 .  Melatonin 1 MG TABS, Take 1 tablet by mouth at bedtime as needed., Disp: , Rfl:  .  tiZANidine (ZANAFLEX) 4 MG tablet, Take 1 tablet (4 mg total) by mouth every 8 (eight) hours as needed for up to 8 days for muscle spasms., Disp: 24 tablet, Rfl: 0 .  oxyCODONE-acetaminophen (PERCOCET) 10-325 MG tablet, Take 1 tablet by mouth every 8 (eight) hours as needed for pain., Disp: 90 tablet, Rfl: 0  ROS  Constitutional: Denies any fever or chills Gastrointestinal: No reported hemesis, hematochezia, vomiting, or acute GI distress Musculoskeletal: Denies any acute onset joint swelling, redness, loss of ROM, or weakness Neurological: No reported episodes of acute onset apraxia, aphasia, dysarthria, agnosia, amnesia, paralysis, loss of coordination, or loss of consciousness  Allergies  Mr. Batts has No Known Allergies.  [place]  Drug: Mr. Greenhaw  reports that he does not use drugs. Alcohol:  reports that he does not drink alcohol. Tobacco:  reports that he has been smoking cigarettes.  He has a 30.00 pack-year smoking history. He has never used smokeless tobacco. Medical:  has a past medical history of Allergy, Arthritis, Cancer ([place]), Current every day smoker, Diverticulosis, GERD (gastroesophageal reflux disease), H/O diverticulitis of colon, Restless leg syndrome, and Sciatic pain. Surgical: Mr. Buffa  has a past surgical history that includes Tonsillectomy and adenoidectomy; Total knee arthroplasty (Left, 10/14/2017); skin cancer removal; and Joint replacement (Left, 10/14/2018). Family: family history includes Alzheimer's disease in his father; Cancer (age of onset: 49) in his sister; Diabetes in his sister; Healthy in  his brother, son, son, son, and son; Heart disease in his brother; Hypertension in his mother.  Constitutional Exam  General appearance: Well nourished, well developed, and well hydrated. In no apparent acute distress Vitals:   03/17/18 0826  BP: 135/87  Pulse: 77  Resp: 16  Temp: 98.4 F (36.9 C)  TempSrc: Oral  SpO2: 97%  Weight: 217 lb (98.4 kg)  Height: _0  (1.88 m)   BMI Assessment: Estimated body mass index is 27.86 kg/m as calculated from the following:   Height as of this encounter: _1  (1.88 m).   Weight as of this encounter: 217 lb (98.4 kg).  BMI interpretation table: BMI level Category Range association with higher incidence of chronic pain  <18 kg/m2 Underweight   18.5-24.9 kg/m2 Ideal body weight   25-29.9 kg/m2 Overweight Increased incidence by 20%  30-34.9 kg/m2 Obese (Class I) Increased incidence by 68%  35-39.9 kg/m2 Severe obesity (Class II) Increased incidence by 136%  >40 kg/m2 Extreme obesity (Class III) Increased incidence by 254%   Patient's current BMI Ideal Body weight  Body mass index is 27.86 kg/m. Ideal body weight: 82.2 kg (181 lb 3.5 oz) Adjusted ideal body weight: 88.7 kg (195 lb 8.5 oz)   BMI Readings from Last 4 Encounters:  03/17/18 27.86 kg/m  03/14/18 30.61 kg/m  03/10/18 30.69 kg/m  02/09/18 28.63 kg/m   Wt Readings from Last 4 Encounters:  03/17/18 217 lb (98.4 kg)  03/14/18 232 lb (105.2 kg)  03/10/18 232 lb 9.6 oz (105.5 kg)  02/09/18 217 lb (98.4 kg)  Psych/Mental status: Alert, oriented x 3 (person, place, & time)       Eyes: PERLA Respiratory: No evidence of acute respiratory distress  Cervical Spine Area Exam  Skin & Axial Inspection: No masses, redness, edema, swelling, or associated skin lesions Alignment: Symmetrical Functional ROM: Unrestricted ROM      Stability: No instability detected Muscle Tone/Strength: Functionally intact. No obvious neuro-muscular anomalies detected. Sensory (Neurological):  Unimpaired Palpation: No palpable anomalies              Upper Extremity (UE) Exam    Side: Right upper extremity  Side: Left upper extremity  Skin & Extremity Inspection: Skin color, temperature, and hair growth are WNL. No peripheral edema or cyanosis. No masses, redness, swelling, asymmetry, or associated skin lesions. No contractures.  Skin & Extremity Inspection: Skin color, temperature,  and hair growth are WNL. No peripheral edema or cyanosis. No masses, redness, swelling, asymmetry, or associated skin lesions. No contractures.  Functional ROM: Unrestricted ROM          Functional ROM: Unrestricted ROM          Muscle Tone/Strength: Functionally intact. No obvious neuro-muscular anomalies detected.  Muscle Tone/Strength: Functionally intact. No obvious neuro-muscular anomalies detected.  Sensory (Neurological): Unimpaired          Sensory (Neurological): Unimpaired          Palpation: No palpable anomalies              Palpation: No palpable anomalies              Provocative Test(s):  Phalen's test: deferred Tinel's test: deferred Apley's scratch test (touch opposite shoulder):  Action 1 (Across chest): deferred Action 2 (Overhead): deferred Action 3 (LB reach): deferred   Provocative Test(s):  Phalen's test: deferred Tinel's test: deferred Apley's scratch test (touch opposite shoulder):  Action 1 (Across chest): deferred Action 2 (Overhead): deferred Action 3 (LB reach): deferred    Thoracic Spine Area Exam  Skin & Axial Inspection: No masses, redness, or swelling Alignment: Symmetrical Functional ROM: Unrestricted ROM Stability: No instability detected Muscle Tone/Strength: Functionally intact. No obvious neuro-muscular anomalies detected. Sensory (Neurological): Unimpaired Muscle strength & Tone: No palpable anomalies  Lumbar Spine Area Exam  Skin & Axial Inspection: No masses, redness, or swelling Alignment: Symmetrical Functional ROM: Pain restricted ROM        Stability: No instability detected Muscle Tone/Strength: Functionally intact. No obvious neuro-muscular anomalies detected. Sensory (Neurological): Musculoskeletal pain pattern Palpation: Complains of area being tender to palpation Right Fist Percussion Test Provocative Tests: Lumbar Hyperextension/rotation test: Positive bilaterally for facet joint pain. Lumbar quadrant test (Kemp's test): (+) bilaterally for facet joint pain. Lumbar Lateral bending test: (+) due to pain. on the right he does not feel like the knee is radiating from Robaxin as Patrick's Maneuver: deferred today                   FABER test: deferred today       Thigh-thrust test: deferred today       S-I compression test: deferred today       S-I distraction test: deferred today        Gait & Posture Assessment  Ambulation: Unassisted Gait: Relatively normal for age and body habitus Posture: WNL   Lower Extremity Exam    Side: Right lower extremity  Side: Left lower extremity  Stability: No instability observed          Stability: No instability observed          Skin & Extremity Inspection: Skin color, temperature, and hair growth are WNL. No peripheral edema or cyanosis. No masses, redness, swelling, asymmetry, or associated skin lesions. No contractures.  Skin & Extremity Inspection: Skin color, temperature, and hair growth are WNL. No peripheral edema or cyanosis. No masses, redness, swelling, asymmetry, or associated skin lesions. No contractures.  Functional ROM: Unrestricted ROM                  Functional ROM: Unrestricted ROM                  Muscle Tone/Strength: Functionally intact. No obvious neuro-muscular anomalies detected.  Muscle Tone/Strength: Functionally intact. No obvious neuro-muscular anomalies detected.  Sensory (Neurological): Unimpaired  Sensory (Neurological): Unimpaired  Palpation: No palpable anomalies  Palpation: No palpable  anomalies   Assessment  Primary Diagnosis & Pertinent Problem  List: The primary encounter diagnosis was Closed fracture of transverse process of lumbar vertebra with routine healing, subsequent encounter. Diagnoses of Chronic pain syndrome, Lumbar radiculopathy, Lumbar degenerative disc disease, Lumbar foraminal stenosis, and History of left knee replacement were also pertinent to this visit.  Status Diagnosis  Controlled Controlled Controlled 1. Closed fracture of transverse process of lumbar vertebra with routine healing, subsequent encounter   2. Chronic pain syndrome   3. Lumbar radiculopathy   4. Lumbar degenerative disc disease   5. Lumbar foraminal stenosis   6. History of left knee replacement      General Recommendations: The pain condition that the patient suffers from is best treated with a multidisciplinary approach that involves an increase in physical activity to prevent de-conditioning and worsening of the pain cycle, as well as psychological counseling (formal and/or informal) to address the co-morbid psychological affects of pain. Treatment will often involve judicious use of pain medications and interventional procedures to decrease the pain, allowing the patient to participate in the physical activity that will ultimately produce long-lasting pain reductions. The goal of the multidisciplinary approach is to return the patient to a higher level of overall function and to restore their ability to perform activities of daily living.  62 year old male with history of chronic pain localized to his left knee status post left knee replacement surgery as well as chronic axial low back pain that radiates to bilateral buttocks and thighs, right greater than left secondary to lumbar degenerative disc disease, foraminal stenosis, radiculopathy.   Patient presents today for medication management.  His last visit with me was for a lumbar epidural steroid injection performed on 02/09/2018 which he states was not as helpful as his first one.  In the  interim, the patient sustained a fall which prompted him to go to the ED on 03/14/2018.  Patient states that in mid May, he was sitting in a chair that reclines back and that he reclined back to far resulting in a chair falling in the floor along with himself.  CT of the patient's lumbar spine shows new acute fractures of the transverse process of the lumbar spine at L1, L2 and L3 on the right side.  He was given a short supply of Percocet 5 mg, quantity 20 on his ED visit.  Given that the patient is continuing to endorse acute on chronic severe pain more pronounced on his right side, we discussed increasing his opioid therapy for the short-term until his acute pain from his TP fractures improves.  This will be a short-term increase and hopefully in 1 to 2 months will be able to decrease his chronic opioid regimen down to his previous dose.  Of note patient did not bring his prior prescription bottle.  We will hold his current prescriptions until the patient is able to bring his previous prescription bottle and have it verified.  Plan: -Prescription for Percocet at a higher dose (10 mg TID from 5 mg TID) given acute TP fracture in the context of chronic left knee and axial low back pain. -Continue Voltaren gel.  Encourage patient to apply this to his right side at least twice a day.  Can alternate with lidocaine patches.  Also recommended TENS unit to this region. -Continue Lexapro 10 mg daily -Continue melatonin for insomnia  Future considerations: Lumbar facet medial branch nerve blocks for lumbar spondylosis as noted on most recent CT, left knee genicular nerve block,  repeat lumbar ESI #3  Pharmacotherapy (Medications Ordered): Meds ordered this encounter  Medications  . DISCONTD: oxyCODONE-acetaminophen (PERCOCET) 10-325 MG tablet    Sig: Take 1 tablet by mouth every 8 (eight) hours as needed for pain.    Dispense:  90 tablet    Refill:  0    Do not place this medication, or any other  prescription from our practice, on "Automatic Refill". Patient may have prescription filled one day early if pharmacy is closed on scheduled refill date.   To last for 30 days from fill date To fill on or after: 03/18/18, 04/17/18  . oxyCODONE-acetaminophen (PERCOCET) 10-325 MG tablet    Sig: Take 1 tablet by mouth every 8 (eight) hours as needed for pain.    Dispense:  90 tablet    Refill:  0    Do not place this medication, or any other prescription from our practice, on "Automatic Refill". Patient may have prescription filled one day early if pharmacy is closed on scheduled refill date.   To last for 30 days from fill date To fill on or after: 03/18/18, 04/17/18   Lab-work, procedure(s), and/or referral(s): No orders of the defined types were placed in this encounter.   Time Note: Greater than 50% of the 25 minute(s) of face-to-face time spent with Mr. Strohman, was spent in counseling/coordination of care regarding: Mr. Cowdery's primary cause of pain, the significance of each one oth the test(s) anomalies and it's corresponding characteristic pain pattern(s), the treatment plan, treatment alternatives, the results, interpretation and significance of  his recent diagnostic interventional treatment(s), the appropriate use of his medications, realistic expectations, the goals of pain management (increased in functionality), the medication agreement and the patient's responsibilities when it comes to controlled substances.  Provider-requested follow-up: Return in about 8 weeks (around 05/12/2018) for Medication Management.  Future Appointments  Date Time Provider [place]  05/10/2018  8:30 AM [name], MD [facility] None    Primary Care Physician: Mikey College, NP Location: [facility] Note by: [name], M.D Date: 03/17/2018; Time: 9:17 AM  Patient Instructions  Patient will return with pill bottles to get prescriptions

## 2018-03-18 ENCOUNTER — Encounter: Payer: Self-pay | Admitting: Nurse Practitioner

## 2018-04-04 ENCOUNTER — Ambulatory Visit (INDEPENDENT_AMBULATORY_CARE_PROVIDER_SITE_OTHER): Payer: BLUE CROSS/BLUE SHIELD | Admitting: Family Medicine

## 2018-04-04 ENCOUNTER — Encounter: Payer: Self-pay | Admitting: Family Medicine

## 2018-04-04 VITALS — BP 138/75 | HR 82 | Temp 98.4°F | Resp 16 | Ht 73.0 in | Wt 218.6 lb

## 2018-04-04 DIAGNOSIS — F1193 Opioid use, unspecified with withdrawal: Secondary | ICD-10-CM

## 2018-04-04 DIAGNOSIS — R3911 Hesitancy of micturition: Secondary | ICD-10-CM | POA: Diagnosis not present

## 2018-04-04 DIAGNOSIS — F1123 Opioid dependence with withdrawal: Secondary | ICD-10-CM

## 2018-04-04 DIAGNOSIS — R197 Diarrhea, unspecified: Secondary | ICD-10-CM | POA: Diagnosis not present

## 2018-04-04 DIAGNOSIS — N401 Enlarged prostate with lower urinary tract symptoms: Secondary | ICD-10-CM | POA: Diagnosis not present

## 2018-04-04 DIAGNOSIS — R350 Frequency of micturition: Secondary | ICD-10-CM

## 2018-04-04 MED ORDER — TAMSULOSIN HCL 0.4 MG PO CAPS: 0.4000 mg | ORAL_CAPSULE | Freq: Every day | ORAL | 3 refills | Status: DC

## 2018-04-04 NOTE — Patient Instructions (Addendum)
Thank you for coming to the office today.  Please contact [facility] to discuss next plan for Colonoscopy should be due later this year.  [address] [place], [place] [address] Hours: 8AM-5PM Phone: [phone]  Recommend Imodium OTC follow instructions - Oral: Initial: 4 mg, followed by 2 mg after each loose stool (maximum: 16 mg/day)  Try to resume more stable diet with solids.  May try Fiber Supplement - Metamucil  May try Probiotic  Please schedule a Follow-up Appointment to: Return in about 6 weeks (around 05/16/2018) for BPH, Diarrhea.  If you have any other questions or concerns, please feel free to call the office or send a message through [place]. You may also schedule an earlier appointment if necessary.  Additionally, you may be receiving a survey about your experience at our office within a few days to 1 week by e-mail or mail. We value your feedback.  Nobie Putnam, DO [place]

## 2018-04-04 NOTE — Assessment & Plan Note (Signed)
Consistent clinically with new diagnosis BPH, with lower urinary tract symptoms (LUTS) Also with new episode of hematospermia seems unrelated - AUA BPH score 14 Never on medication before - Last PSA 0.79 (2014) - Last DRE >4 year ago Fam history of prostate cancer, father age 38s approx 22  Plan: 1. Start Tamsulosin 0.4mg  daily, advised on benefits, risks, if BP low caution with sudden standing up or position change - Check PSA today lab 2. Follow-up 4-6 weeks with PCP, consider future increased dose if need or may seek 2nd opinion from urology if abnormal PSA or limited improvement symptoms, or recurrent hematospermia

## 2018-04-04 NOTE — Progress Notes (Signed)
Subjective:    Patient ID: Anthony Nguyen, male    DOB: [birth year], 62 y.o.   MRN: [id number]  Anthony Nguyen is a 62 y.o. male presenting on 04/04/2018 for Benign Prostatic Hypertrophy  Patient presents for a same day appointment. PCP is Cassell Smiles, AGPCNP-BC  HPI   Hematospermia / LUTS - Reports new problem with hematospermia, first episode over past weekend on Saturday, he described episode earlier in day picked up a heavy toolbox and was not sure if this was related. - Last lab test PSA 07/2013, showed 0.79 - He does take chronic pain medicine with Oxycodone in past, now off med while waiting on future refill he has increased urination Denies dysuria, hematuria, flank or abdominal pain  AUA BPH Symptom Score over past 1 month 1. Sensation of not emptying bladder post void - 2 2. Urinate less than 2 hour after finish last void - 4 3. Start/Stop several times during void - 3 4. Difficult to postpone urination - 1 5. Weak urinary stream - 2 6. Push or strain urination - 0 7. Nocturia - 2 times  Total Score: 14 (Moderate BPH symptoms)  Diarrhea Reports new problem loose stools, watery. No known sick contacts. 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 He denies any blood in stool or other abnormality - He has been off of oxycodone over past 1 week, thinks he may have some withdrawal symptoms being off opiate, he has diarrhea occasionally from this in the past, also says not sleeping well some sweats - Tried Pepto-bismol PRN - Also history of hemorrhoids - Denies any abdominal pain, nausea vomiting  Health Maintenance: Last Colonoscopy reported about 8-9 years - last done by [facility] approx 2009.  Depression screen [facility] 2/9 04/04/2018 03/17/2018 02/09/2018  Decreased Interest 0 0 0  Down, Depressed, Hopeless 0 0 0  PHQ - 2 Score 0 0 0  Altered sleeping - - -  Tired, decreased energy - - -    Change in appetite - - -  Feeling bad or failure about yourself  - - -  Trouble concentrating - - -  Moving slowly or fidgety/restless - - -  Suicidal thoughts - - -  PHQ-9 Score - - -  Difficult doing work/chores - - -    Social History   Tobacco Use  . Smoking status: Current Every Day Smoker    Packs/day: 1.00    Years: 30.00    Pack years: 30.00    Types: Cigarettes  . Smokeless tobacco: Never Used  Substance Use Topics  . Alcohol use: No    Frequency: Never  . Drug use: No    Review of Systems Per HPI unless specifically indicated above     Objective:    BP 138/75   Pulse 82   Temp 98.4 F (36.9 C) (Oral)   Resp 16   Ht 6\' 1"  (1.854 m)   Wt 218 lb 9.6 oz (99.2 kg)   BMI 28.84 kg/m   Wt Readings from Last 3 Encounters:  04/04/18 218 lb 9.6 oz (99.2 kg)  03/17/18 217 lb (98.4 kg)  03/14/18 232 lb (105.2 kg)    Physical Exam  Constitutional: He is oriented to person, place, and time. He appears well-developed and well-nourished. No distress.  Well-appearing, comfortable, cooperative  HENT:  Head: Normocephalic and atraumatic.  Mouth/Throat: Oropharynx is clear and moist.  Eyes: Conjunctivae  are normal. Right eye exhibits no discharge. Left eye exhibits no discharge.  Cardiovascular: Normal rate.  Pulmonary/Chest: Effort normal.  Abdominal: Soft. Bowel sounds are normal. He exhibits no distension. There is no tenderness. There is no guarding.  Musculoskeletal: He exhibits no edema.  Neurological: He is alert and oriented to person, place, and time.  Skin: Skin is warm and dry. No rash noted. He is not diaphoretic. No erythema.  Psychiatric: He has a normal mood and affect. His behavior is normal.  Well groomed, good eye contact, normal speech and thoughts  Nursing note and vitals reviewed.      Assessment & Plan:   Problem List Items Addressed This Visit    Benign prostatic hyperplasia with urinary frequency - Primary    Consistent clinically with  new diagnosis BPH, with lower urinary tract symptoms (LUTS) Also with new episode of hematospermia seems unrelated - AUA BPH score 14 Never on medication before - Last PSA 0.79 (2014) - Last DRE >4 year ago Fam history of prostate cancer, father age 78s approx 70  Plan: 1. Start Tamsulosin 0.4mg  daily, advised on benefits, risks, if BP low caution with sudden standing up or position change - Check PSA today lab 2. Follow-up 4-6 weeks with PCP, consider future increased dose if need or may seek 2nd opinion from urology if abnormal PSA or limited improvement symptoms, or recurrent hematospermia       Relevant Medications   tamsulosin (FLOMAX) 0.4 MG CAPS capsule    Other Visit Diagnoses    Diarrhea, unspecified type       Opiate withdrawal ([place])        Suspect uncomplicated watery loose stool diarrhea is likely from opiate withdrawal now off about 1 week seems consistent with timeline of his onset symptoms few days after stopped taking med, due to ran out, has to wait until due for next fill. Also associated sweats at night, without other GI red flags, abdominal pain, dark stool blood in stool or other viral illness symptoms, without nausea/vomiting.  Plan Reassurance, see course of symptoms, when restart opiate if improves May try Imodium OTC PRN diarrhea Improve fiber, metamucil, try probiotic as well Follow-up if worsening  - Additionally he will contact [facility] to schedule next screening colonoscopy due approx 09/2018, about 10 years from last, do not have last report, but chart review and per patient it has been almost 10 years, he declines DRE today if will have colonoscopy and also this may be beneficial with his history of diarrhea in future   Meds ordered this encounter  Medications  . tamsulosin (FLOMAX) 0.4 MG CAPS capsule    Sig: Take 1 capsule (0.4 mg total) by mouth daily.    Dispense:  30 capsule    Refill:  3    Follow up plan: Return in about 6 weeks  (around 05/16/2018) for BPH, Diarrhea.  Nobie Putnam, [facility] 04/04/2018, 12:44 PM

## 2018-04-05 ENCOUNTER — Encounter: Payer: Self-pay | Admitting: Emergency Medicine

## 2018-04-05 ENCOUNTER — Emergency Department
Admission: EM | Admit: 2018-04-05 | Discharge: 2018-04-06 | Disposition: A | Discharge status: Home / Self Care | Payer: BLUE CROSS/BLUE SHIELD | Attending: Emergency Medicine | Admitting: Emergency Medicine

## 2018-04-05 DIAGNOSIS — G4700 Insomnia, unspecified: Secondary | ICD-10-CM | POA: Diagnosis not present

## 2018-04-05 DIAGNOSIS — Z96652 Presence of left artificial knee joint: Secondary | ICD-10-CM | POA: Insufficient documentation

## 2018-04-05 DIAGNOSIS — Z79899 Other long term (current) drug therapy: Secondary | ICD-10-CM | POA: Diagnosis not present

## 2018-04-05 DIAGNOSIS — N400 Enlarged prostate without lower urinary tract symptoms: Secondary | ICD-10-CM

## 2018-04-05 DIAGNOSIS — R1030 Lower abdominal pain, unspecified: Secondary | ICD-10-CM | POA: Insufficient documentation

## 2018-04-05 DIAGNOSIS — F17210 Nicotine dependence, cigarettes, uncomplicated: Secondary | ICD-10-CM | POA: Insufficient documentation

## 2018-04-05 HISTORY — DX: Dorsalgia, unspecified: M54.9

## 2018-04-05 HISTORY — DX: Other chronic pain: G89.29

## 2018-04-05 LAB — COMPREHENSIVE METABOLIC PANEL
ALT: 19 U/L (ref 17–63)
AST: 26 U/L (ref 15–41)
Albumin: 4.7 g/dL (ref 3.5–5.0)
Alkaline Phosphatase: 66 U/L (ref 38–126)
Anion gap: 10 (ref 5–15)
BUN: 20 mg/dL (ref 6–20)
CHLORIDE: 109 mmol/L (ref 101–111)
CO2: 23 mmol/L (ref 22–32)
CREATININE: 1.07 mg/dL (ref 0.61–1.24)
Calcium: 9.4 mg/dL (ref 8.9–10.3)
GFR calc non Af Amer: >60 mL/min (ref >60)
Glucose, Bld: 148 mg/dL — ABNORMAL HIGH (ref 65–99)
Potassium: 3.3 mmol/L — ABNORMAL LOW (ref 3.5–5.1)
SODIUM: 142 mmol/L (ref 135–145)
Total Bilirubin: 0.8 mg/dL (ref 0.3–1.2)
Total Protein: 7.3 g/dL (ref 6.5–8.1)

## 2018-04-05 LAB — LIPASE, BLOOD: LIPASE: 59 U/L — AB (ref 11–51)

## 2018-04-05 LAB — CBC
HEMATOCRIT: 39.9 % — AB (ref 40.0–52.0)
Hemoglobin: 13.6 g/dL (ref 13.0–18.0)
MCH: 30.3 pg (ref 26.0–34.0)
MCHC: 34.1 g/dL (ref 32.0–36.0)
MCV: 88.7 fL (ref 80.0–100.0)
PLATELETS: 183 10*3/uL (ref 150–440)
RBC: 4.5 MIL/uL (ref 4.40–5.90)
RDW: 13.8 % (ref 11.5–14.5)
WBC: 9.7 10*3/uL (ref 3.8–10.6)

## 2018-04-05 LAB — URINALYSIS, COMPLETE (UACMP) WITH MICROSCOPIC
BACTERIA UA: NONE SEEN
Bilirubin Urine: NEGATIVE
Glucose, UA: NEGATIVE mg/dL
Hgb urine dipstick: NEGATIVE
KETONES UR: NEGATIVE mg/dL
Leukocytes, UA: NEGATIVE
Nitrite: NEGATIVE
PH: 5 (ref 5.0–8.0)
PROTEIN: 30 mg/dL — AB
SQUAMOUS EPITHELIAL / LPF: NONE SEEN (ref 0–5)
Specific Gravity, Urine: 1.031 — ABNORMAL HIGH (ref 1.005–1.030)

## 2018-04-05 LAB — PSA, TOTAL WITH REFLEX TO PSA, FREE: PSA, Total: 2.1 ng/mL (ref <=4.0)

## 2018-04-05 NOTE — ED Triage Notes (Signed)
Pt c/o lower abdominal pain that radiates into lower back x4 days. Pt seen at PCP on Monday with the PCP having concerns of prostate cancer/issues. Pt to ED tonight due to increased pain. Pt denies urinary symptoms.

## 2018-04-06 ENCOUNTER — Emergency Department: Payer: BLUE CROSS/BLUE SHIELD

## 2018-04-06 ENCOUNTER — Telehealth: Payer: Self-pay

## 2018-04-06 ENCOUNTER — Encounter: Payer: Self-pay | Admitting: Emergency Medicine

## 2018-04-06 MED ORDER — IOPAMIDOL (ISOVUE-300) INJECTION 61%
30.0000 mL | Freq: Once | INTRAVENOUS | Status: AC
  Administered 2018-04-06: 30 mL via ORAL

## 2018-04-06 MED ORDER — IOPAMIDOL (ISOVUE-370) INJECTION 76%
100.0000 mL | Freq: Once | INTRAVENOUS | Status: AC | PRN
  Administered 2018-04-06: 100 mL via INTRAVENOUS

## 2018-04-06 NOTE — Discharge Instructions (Addendum)
Your workup in the Emergency Department today was reassuring.  We did not find any specific abnormalities.  We recommend you drink plenty of fluids, take your regular medications and/or any new ones prescribed today, and follow up with the doctor(s) listed in these documents as recommended.  Return to the Emergency Department if you develop new or worsening symptoms that concern you.  

## 2018-04-06 NOTE — Telephone Encounter (Signed)
-----   Message from Olin Hauser, DO sent at 04/05/2018 12:44 PM EDT ----- Patient last seen by me on 04/04/18  Please notify patient of lab result:  He had symptoms of BPH, treated with Tamsulosin (Flomax) and also checked PSA for screening. His result was 2.1, this is normal range for PSA. Unlikely to be prostate cancer. His last result was PSA 0.79 (07/2013) for comparison, elevation is likely due to BPH.  Will forward result to PCP Cassell Smiles, AGPCNP-BC  Nobie Putnam, DO [facility] 04/05/2018, 12:43 PM

## 2018-04-06 NOTE — ED Provider Notes (Signed)
[facility] Emergency Department Provider Note  ____________________________________________   First MD Initiated Contact with Patient 04/05/18 2355     (approximate)  I have reviewed the triage vital signs and the nursing notes.   HISTORY  Chief Complaint Abdominal Pain    HPI Anthony Nguyen is a 62 y.o. male with medical history as listed below who presents for evaluation of gradually worsening lower abdominal pain for about 4 days.  He states that when it started he had one day of loose stools and then he has become constipated.  He believes the constipation is due to the Percocet he takes for his chronic back pain (he sees Dr. Holley Raring at the pain clinic).  But he also feels like he is having some swelling in his lower abdomen and nothing is making it better or worse.  He describes the pain as a dull and aching and "full" sensation.  He has had no appetite and he has not been able to sleep recently.  He followed up with his primary care doctor a few days ago and was told that she was concerned he may have prostate cancer due to his symptoms and some outpatient testing.  A PSA was sent but he does not know the results and he has been worried that he is suffering from prostate cancer.  He and his wife are concerned he may have a mass that is causing him to have difficulties with bowel movements.  He reports insomnia and fatigue.  He denies fever/chills, chest pain, shortness of breath, nausea, and vomiting.  He has not had much appetite and has had constipation for a few days.  He denies dysuria.  No unexplained weight loss.  Past Medical History:  Diagnosis Date  . Allergy   . Arthritis    osteoarthritis  . Cancer ([place])    melanoma skin cancer face  . Chronic back pain    goes to Dr. Holley Raring at the pain clinic  . Current every day smoker   . Diverticulosis   . GERD (gastroesophageal reflux disease)   . H/O diverticulitis of colon   . Restless leg  syndrome   . Sciatic pain     Patient Active Problem List   Diagnosis Date Noted  . Benign prostatic hyperplasia with urinary frequency 04/04/2018  . Lumbar radiculopathy 12/29/2017  . Lumbar degenerative disc disease 12/29/2017  . Lumbar foraminal stenosis 12/29/2017  . History of left knee replacement 12/29/2017  . Primary osteoarthritis of both knees 12/29/2017  . Chronic pain syndrome 12/29/2017  . Primary osteoarthritis of left knee 10/14/2017  . Chronic nasal congestion 11/24/2013  . Benign paroxysmal positional vertigo 11/24/2013  . Sleep disturbance 08/17/2013  . Allergy 08/17/2013  . GERD (gastroesophageal reflux disease) 08/17/2013  . Tobacco abuse counseling 08/17/2013  . Ringworm 08/17/2013    Past Surgical History:  Procedure Laterality Date  . JOINT REPLACEMENT Left 10/14/2018   knee  . skin cancer removal     face  . TONSILLECTOMY AND ADENOIDECTOMY    . TOTAL KNEE ARTHROPLASTY Left 10/14/2017   Procedure: TOTAL KNEE ARTHROPLASTY;  Surgeon: Hessie Knows, MD;  Location: [facility] ORS;  Service: Orthopedics;  Laterality: Left;    Prior to Admission medications   Medication Sig Start Date End Date Taking? Authorizing Provider  cetirizine (ZYRTEC) 10 MG tablet Take 1 tablet (10 mg total) by mouth daily. 12/23/17  Yes Mikey College, NP  diclofenac sodium (VOLTAREN) 1 % GEL Apply 4 g topically  4 (four) times daily as needed (left knee and back pain). 03/10/18  Yes Mikey College, NP  escitalopram (LEXAPRO) 10 MG tablet Take 10 mg by mouth daily.   Yes [provider]  fluticasone (FLONASE) 50 MCG/ACT nasal spray Place 2 sprays into both nostrils daily. 12/23/17  Yes Mikey College, NP  Melatonin 1 MG TABS Take 1 tablet by mouth at bedtime as needed.   Yes [provider]  oxyCODONE-acetaminophen (PERCOCET) 10-325 MG tablet Take 1 tablet by mouth every 8 (eight) hours as needed for pain. 03/17/18  Yes [name], MD  tamsulosin  (FLOMAX) 0.4 MG CAPS capsule Take 1 capsule (0.4 mg total) by mouth daily. 04/04/18  Yes Karamalegos, Devonne Doughty, DO  Diclofenac Sodium 3 % GEL Place 1 application onto the skin 3 (three) times daily as needed (left knee pain). Patient not taking: Reported on 04/06/2018 03/10/18   Mikey College, NP    Allergies Patient has no known allergies.  Family History  Problem Relation Age of Onset  . Hypertension Mother   . Alzheimer's disease Father   . Prostate cancer Father 56  . Cancer Sister 24       breast cancer  . Diabetes Sister   . Heart disease Brother        electrical pathways - ongoing workup  . Healthy Brother   . Healthy Son   . Healthy Son   . Healthy Son   . Healthy Son     Social History Social History   Tobacco Use  . Smoking status: Current Every Day Smoker    Packs/day: 1.00    Years: 30.00    Pack years: 30.00    Types: Cigarettes  . Smokeless tobacco: Never Used  Substance Use Topics  . Alcohol use: No    Frequency: Never  . Drug use: No    Review of Systems Constitutional: No fever/chills.  Insomnia and fatigue. Eyes: No visual changes. ENT: No sore throat. Cardiovascular: Denies chest pain. Respiratory: Denies shortness of breath. Gastrointestinal: Lower abdominal pain with lack of appetite.  Constipation. Genitourinary: Negative for dysuria. Musculoskeletal: Negative for neck pain.  Negative for back pain. Integumentary: Negative for rash. Neurological: Negative for headaches, focal weakness or numbness.   ____________________________________________   PHYSICAL EXAM:  VITAL SIGNS: ED Triage Vitals [04/05/18 2027]  Enc Vitals Group     BP (!) 144/77     Pulse Rate 85     Resp 17     Temp 98 F (36.7 C)     Temp Source Oral     SpO2 100 %     Weight      Height      Head Circumference      Peak Flow      Pain Score      Pain Loc      Pain Edu?      Excl. in [place]?     Constitutional: Alert and oriented. Well appearing  and in no acute distress. Eyes: Conjunctivae are normal.  Head: Atraumatic. Nose: No congestion/rhinnorhea. Mouth/Throat: Mucous membranes are moist. Neck: No stridor.  No meningeal signs.   Cardiovascular: Normal rate, regular rhythm. Good peripheral circulation. Grossly normal heart sounds. Respiratory: Normal respiratory effort.  No retractions. Lungs CTAB. Gastrointestinal: Soft and nondistended.  Diffuse tenderness to palpation of the lower abdomen but no focal tenderness.  No rebound and no guarding. Musculoskeletal: No lower extremity tenderness nor edema. No gross deformities of extremities.  No  CVA tenderness to percussion. Neurologic:  Normal speech and language. No gross focal neurologic deficits are appreciated.  Skin:  Skin is warm, dry and intact. No rash noted. Psychiatric: Mood and affect are normal. Speech and behavior are normal.  ____________________________________________   LABS (all labs ordered are listed, but only abnormal results are displayed)  Labs Reviewed  LIPASE, BLOOD - Abnormal; Notable for the following components:      Result Value   Lipase 59 (*)    All other components within normal limits  COMPREHENSIVE METABOLIC PANEL - Abnormal; Notable for the following components:   Potassium 3.3 (*)    Glucose, Bld 148 (*)    All other components within normal limits  CBC - Abnormal; Notable for the following components:   HCT 39.9 (*)    All other components within normal limits  URINALYSIS, COMPLETE (UACMP) WITH MICROSCOPIC - Abnormal; Notable for the following components:   Color, Urine YELLOW (*)    APPearance CLEAR (*)    Specific Gravity, Urine 1.031 (*)    Protein, ur 30 (*)    All other components within normal limits   ____________________________________________  EKG  None - EKG not ordered by ED physician ____________________________________________  RADIOLOGY   ED MD interpretation:  No acute abnormalities, enlarged  prostate  Official radiology report(s): Ct Abdomen Pelvis W Contrast  Result Date: 04/06/2018 CLINICAL DATA:  Lower abdominal pain radiating to the low back for 4 days. EXAM: CT ABDOMEN AND PELVIS WITH CONTRAST TECHNIQUE: Multidetector CT imaging of the abdomen and pelvis was performed using the standard protocol following bolus administration of intravenous contrast. CONTRAST:  170mL ISOVUE-370 IOPAMIDOL (ISOVUE-370) INJECTION 76% COMPARISON:  11/07/2014 FINDINGS: Lower chest: Dependent changes in the lung bases. Coronary artery calcifications. Hepatobiliary: Mild diffuse fatty infiltration of the liver. No focal liver lesions. Gallbladder and bile ducts are unremarkable. Pancreas: Unremarkable. No pancreatic ductal dilatation or surrounding inflammatory changes. Spleen: Normal in size without focal abnormality. Adrenals/Urinary Tract: Adrenal glands are unremarkable. Kidneys are normal, without renal calculi, focal lesion, or hydronephrosis. Bladder wall is not thickened. No filling defects. Minimal left posterior bladder diverticulum. Stomach/Bowel: Stomach is within normal limits. Appendix appears normal. No evidence of bowel wall thickening, distention, or inflammatory changes. Vascular/Lymphatic: Aortic atherosclerosis. No enlarged abdominal or pelvic lymph nodes. Reproductive: Prostate gland is enlarged, measuring 4.7 cm. Other: No free air or free fluid in the abdomen. Minimal periumbilical hernia containing fat. Musculoskeletal: Spondylolysis with minimal spondylolisthesis at L5-S1. Healing fractures of the right second and third lumbar transverse processes. Old bilateral rib fractures. IMPRESSION: 1. Mild fatty infiltration of the liver. 2. Small bladder diverticulum. No bladder wall thickening or filling defect. 3. Aortic atherosclerosis. 4. Enlarged prostate gland. 5. 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 Electronically Signed   By: Lucienne Capers M.D.   On: 04/06/2018 01:52     ____________________________________________   PROCEDURES  Critical Care performed: No   Procedure(s) performed:   Procedures   ____________________________________________   INITIAL IMPRESSION / ASSESSMENT AND PLAN / ED COURSE  As part of my medical decision making, I reviewed the following data within the [place] History obtained from family, Nursing notes reviewed and incorporated, Labs reviewed , Old chart reviewed and Notes from prior ED visits    Differential diagnosis includes, but is not limited to, BPH, prostate cancer, diverticulitis, SBO/ileus, constipation, appendicitis.  The patient is well-appearing in no acute distress in spite of his symptoms.  His vital signs are stable and within normal  limits.  Lab work is notable for a normal urinalysis, normal conference of metabolic panel, very slightly elevated lipase of no particular significance given no upper abdominal tenderness, and no leukocytosis.  No anemia.  I reviewed his chart and saw the notes regarding his recent outpatient visit.  I also saw that his PSA is 2.1 and I read the note from his PCP indicating that this is within normal limits and they felt that prostate cancers are unlikely.  I provided this good news to the patient.  Given his symptoms and tenderness, I will obtain a CT scan with oral and IV contrast for further evaluation and to rule out any emergent issue that is developed over the last 4 days such as diverticulitis or obstruction.  If this is normal the patient is comfortable with outpatient follow-up.  Clinical Course as of Apr 06 224  Wed Apr 06, 2018  0223 No acute abnormalities identified on CT scan.  Patient does have an enlarged prostate but this is most likely BPH particularly given his recent normal PSA.  Recent patient is comfortable with discharge home and outpatient follow-up.   I gave my usual and customary return precautions.   CT ABDOMEN PELVIS W CONTRAST [CF]     Clinical Course User Index [CF] Hinda Kehr, MD    ____________________________________________  FINAL CLINICAL IMPRESSION(S) / ED DIAGNOSES  Final diagnoses:  Enlarged prostate  Lower abdominal pain  Insomnia, unspecified type     MEDICATIONS GIVEN DURING THIS VISIT:  Medications  iopamidol (ISOVUE-300) 61 % injection 30 mL (30 mLs Oral Contrast Given 04/06/18 0029)  iopamidol (ISOVUE-370) 76 % injection 100 mL (100 mLs Intravenous Contrast Given 04/06/18 0118)     ED Discharge Orders    None       Note:  This document was prepared using Dragon voice recognition software and may include unintentional dictation errors.    Hinda Kehr, MD 04/06/18 [phone]

## 2018-04-18 ENCOUNTER — Telehealth: Payer: Self-pay | Admitting: Student in an Organized Health Care Education/Training Program

## 2018-04-18 NOTE — Telephone Encounter (Signed)
Patient lvmail  asking about medications, please call patient to discuss

## 2018-04-19 NOTE — Telephone Encounter (Signed)
Voicemail left, returning patient call.

## 2018-04-25 ENCOUNTER — Telehealth: Payer: Self-pay

## 2018-04-25 NOTE — Telephone Encounter (Signed)
Spoke with family member.  Patient is not home.  Informed her that we were returning his call about a question.  Instructed her to notify patient that if he still had a question, then call the office back when he returns home.

## 2018-05-10 ENCOUNTER — Encounter: Payer: Self-pay | Admitting: Student in an Organized Health Care Education/Training Program

## 2018-05-10 ENCOUNTER — Ambulatory Visit
Payer: Self-pay | Attending: Student in an Organized Health Care Education/Training Program | Admitting: Student in an Organized Health Care Education/Training Program

## 2018-05-10 ENCOUNTER — Other Ambulatory Visit: Payer: Self-pay

## 2018-05-10 VITALS — BP 151/92 | HR 68 | Temp 98.1°F | Resp 18 | Ht 73.0 in | Wt 215.0 lb

## 2018-05-10 DIAGNOSIS — K219 Gastro-esophageal reflux disease without esophagitis: Secondary | ICD-10-CM | POA: Insufficient documentation

## 2018-05-10 DIAGNOSIS — M170 Bilateral primary osteoarthritis of knee: Secondary | ICD-10-CM

## 2018-05-10 DIAGNOSIS — Z96652 Presence of left artificial knee joint: Secondary | ICD-10-CM

## 2018-05-10 DIAGNOSIS — G894 Chronic pain syndrome: Secondary | ICD-10-CM

## 2018-05-10 DIAGNOSIS — M5416 Radiculopathy, lumbar region: Secondary | ICD-10-CM

## 2018-05-10 DIAGNOSIS — N400 Enlarged prostate without lower urinary tract symptoms: Secondary | ICD-10-CM | POA: Insufficient documentation

## 2018-05-10 DIAGNOSIS — F17210 Nicotine dependence, cigarettes, uncomplicated: Secondary | ICD-10-CM | POA: Insufficient documentation

## 2018-05-10 DIAGNOSIS — M48061 Spinal stenosis, lumbar region without neurogenic claudication: Secondary | ICD-10-CM

## 2018-05-10 DIAGNOSIS — M51369 Other intervertebral disc degeneration, lumbar region without mention of lumbar back pain or lower extremity pain: Secondary | ICD-10-CM

## 2018-05-10 DIAGNOSIS — M5116 Intervertebral disc disorders with radiculopathy, lumbar region: Secondary | ICD-10-CM | POA: Insufficient documentation

## 2018-05-10 DIAGNOSIS — M9983 Other biomechanical lesions of lumbar region: Secondary | ICD-10-CM

## 2018-05-10 DIAGNOSIS — M5136 Other intervertebral disc degeneration, lumbar region: Secondary | ICD-10-CM

## 2018-05-10 DIAGNOSIS — Z5181 Encounter for therapeutic drug level monitoring: Secondary | ICD-10-CM | POA: Insufficient documentation

## 2018-05-10 MED ORDER — OXYCODONE-ACETAMINOPHEN 10-325 MG PO TABS: 1.0000 | ORAL_TABLET | Freq: Three times a day (TID) | ORAL | 0 refills | Status: DC | PRN

## 2018-05-10 MED ORDER — PREGABALIN 50 MG PO CAPS: ORAL_CAPSULE | ORAL | 0 refills | Status: DC

## 2018-05-10 MED ORDER — PREGABALIN 50 MG PO CAPS: ORAL_CAPSULE | ORAL | 3 refills | Status: DC

## 2018-05-10 NOTE — Patient Instructions (Addendum)
You have been given 3 scripts for oxycodone/acetaminophen and a script for Lyrica. Lyrica taper explained.  Pt states undestanding.

## 2018-05-10 NOTE — Progress Notes (Signed)
Patient's Name: Anthony Nguyen  MRN: [id number]  Referring Provider: Mikey College, *  DOB: [birth year]  PCP: Mikey College, NP  DOS: 05/10/2018  Note by: [name], MD  Service setting: Ambulatory outpatient  Specialty: Interventional Pain Management  Location: [facility] (AMB) Pain Management Facility    Patient type: Established   Primary Reason(s) for Visit: Encounter for prescription drug management. (Level of risk: moderate)  CC: Back Pain (low)  HPI  Anthony Nguyen is a 62 y.o. year old, male patient, who comes today for a medication management evaluation. He has Sleep disturbance; Allergy; GERD (gastroesophageal reflux disease); Tobacco abuse counseling; Ringworm; Chronic nasal congestion; Benign paroxysmal positional vertigo; Primary osteoarthritis of left knee; Lumbar radiculopathy; Lumbar degenerative disc disease; Lumbar foraminal stenosis; History of left knee replacement; Primary osteoarthritis of both knees; Chronic pain syndrome; and Benign prostatic hyperplasia with urinary frequency on their problem list. His primarily concern today is the Back Pain (low)  Pain Assessment: Location: Lower Back Radiating: radiates down both legs in the back to foot.  Feet burning, some numbness in thighs in front Onset: More than a month ago Duration: Chronic pain Quality: Aching, Constant Severity: 7 /10 (subjective, self-reported pain score)  Note: Reported level is compatible with observation.                         When using our objective Pain Scale, levels between 6 and 10/10 are said to belong in an emergency room, as it progressively worsens from a 6/10, described as severely limiting, requiring emergency care not usually available at an outpatient pain management facility. At a 6/10 level, communication becomes difficult and requires great effort. Assistance to reach the emergency department may be required. Facial flushing and profuse sweating along with potentially  dangerous increases in heart rate and blood pressure will be evident. Effect on ADL: limits activities Timing: Constant Modifying factors: rest BP: (!) 151/92  HR: 68  [name] was last scheduled for an appointment on 04/18/2018 for medication management. During today's appointment we reviewed Anthony Nguyen's chronic pain status, as well as his outpatient medication regimen.  Patient endorsing worsening neuropathy in his right thigh as well as left lateral knee pain.  Patient was discharged from his job given his inability to perform secondary to pain.  Patient is somewhat distraught about this.  The patient  reports that he does not use drugs. His body mass index is 28.37 kg/m.  Further details on both, my assessment(s), as well as the proposed treatment plan, please see below.  Controlled Substance Pharmacotherapy Assessment REMS (Risk Evaluation and Mitigation Strategy)  Analgesic: Percocet 10 mg 3 times daily as needed, quantity 90/month MME/day: 45 mg/day.  Dewayne Shorter, RN  05/10/2018  8:22 AM  Signed Nursing Pain Medication Assessment:  Safety precautions to be maintained throughout the outpatient stay will include: orient to surroundings, keep bed in low position, maintain call bell within reach at all times, provide assistance with transfer out of bed and ambulation.  Medication Inspection Compliance: Pill count conducted under aseptic conditions, in front of the patient. Neither the pills nor the bottle was removed from the patient's sight at any time. Once count was completed pills were immediately returned to the patient in their original bottle.  Medication: Oxycodone/APAP Pill/Patch Count: 1 of 90 pills remain Pill/Patch Appearance: Markings consistent with prescribed medication Bottle Appearance: Standard pharmacy container. Clearly labeled. Filled Date: 06/ 29 / 2019 Last Medication intake:  Yesterday Pharmacokinetics: Liberation and absorption (onset of action):  WNL Distribution (time to peak effect): WNL Metabolism and excretion (duration of action): WNL         Pharmacodynamics: Desired effects: Analgesia: [name] reports >50% benefit. Functional ability: Patient reports that medication allows him to accomplish basic ADLs Clinically meaningful improvement in function (CMIF): Sustained CMIF goals met Perceived effectiveness: Described as relatively effective, allowing for increase in activities of daily living (ADL) Undesirable effects: Side-effects or Adverse reactions: None reported Monitoring: [place] PMP: Online review of the past 71-monthperiod conducted. Compliant with practice rules and regulations Last UDS on record: Summary  Date Value Ref Range Status  01/20/2018 FINAL  Final    Comment:    ==================================================================== TOXASSURE SELECT 13 (MW) ==================================================================== Test                             Result       Flag       Units Drug Present and Declared for Prescription Verification   Oxycodone                      525          EXPECTED   ng/mg creat   Oxymorphone                    331          EXPECTED   ng/mg creat   Noroxycodone                   2011         EXPECTED   ng/mg creat   Noroxymorphone                 174          EXPECTED   ng/mg creat    Sources of oxycodone are scheduled prescription medications.    Oxymorphone, noroxycodone, and noroxymorphone are expected    metabolites of oxycodone. Oxymorphone is also available as a    scheduled prescription medication. ==================================================================== Test                      Result    Flag   Units      Ref Range   Creatinine              159              mg/dL      >=20 ==================================================================== Declared Medications:  The flagging and interpretation on this report are based on the  following declared  medications.  Unexpected results may arise from  inaccuracies in the declared medications.  **Note: The testing scope of this panel includes these medications:  Oxycodone (Percocet)  **Note: The testing scope of this panel does not include following  reported medications:  Acetaminophen (Percocet)  Aspirin (Aspirin 81)  Diclofenac  Escitalopram (Lexapro)  Fluticasone (Flonase)  Melatonin ==================================================================== For clinical consultation, please call [phone] ====================================================================    UDS interpretation: Compliant          Medication Assessment Form: Reviewed. Patient indicates being compliant with therapy Treatment compliance: Compliant Risk Assessment Profile: Aberrant behavior: See prior evaluations. None observed or detected today Comorbid factors increasing risk of overdose: See prior notes. No additional risks detected today Risk of substance use disorder (SUD): Low Opioid Risk Tool - 03/17/18 0838      Family  History of Substance Abuse   Alcohol  Negative    Illegal Drugs  Negative    Rx Drugs  Negative      Personal History of Substance Abuse   Alcohol  Negative    Illegal Drugs  Negative    Rx Drugs  Negative      Age   Age between 66-45 years   No      History of Preadolescent Sexual Abuse   History of Preadolescent Sexual Abuse  Negative or Male      Psychological Disease   Psychological Disease  Negative    Depression  Negative      Total Score   Opioid Risk Tool Scoring  0    Opioid Risk Interpretation  Low Risk      ORT Scoring interpretation table:  Score <3 = Low Risk for SUD  Score between 4-7 = Moderate Risk for SUD  Score >8 = High Risk for Opioid Abuse   Risk Mitigation Strategies:  Patient Counseling: Covered Patient-Prescriber Agreement (PPA): Present and active  Notification to other healthcare providers: Done  Pharmacologic Plan: Will  wean monthly allotment from 90 tablets/month to 75.  Recommend patient try and reduce intake to twice daily as needed.             Laboratory Chemistry  Inflammation Markers (CRP: Acute Phase) (ESR: Chronic Phase) Lab Results  Component Value Date   ESRSEDRATE 30 (H) 09/29/2017                         Rheumatology Markers No results found for: RF, ANA, LABURIC, URICUR, LYMEIGGIGMAB, LYMEABIGMQN, HLAB27                      Renal Function Markers Lab Results  Component Value Date   BUN 20 04/05/2018   CREATININE 1.07 04/05/2018   GFRAA >60 04/05/2018   GFRNONAA >60 04/05/2018                             Hepatic Function Markers Lab Results  Component Value Date   AST 26 04/05/2018   ALT 19 04/05/2018   ALBUMIN 4.7 04/05/2018   ALKPHOS 66 04/05/2018   LIPASE 59 (H) 04/05/2018                        Electrolytes Lab Results  Component Value Date   NA 142 04/05/2018   K 3.3 (L) 04/05/2018   CL 109 04/05/2018   CALCIUM 9.4 04/05/2018                        Neuropathy Markers No results found for: VITAMINB12, FOLATE, HGBA1C, HIV                      Bone Pathology Markers No results found for: VD25OH, VD125OH2TOT, [id number], [id number], 25OHVITD1, 25OHVITD2, 25OHVITD3, TESTOFREE, TESTOSTERONE                       Coagulation Parameters Lab Results  Component Value Date   INR 0.96 09/29/2017   LABPROT 12.7 09/29/2017   APTT 31 09/29/2017   PLT 183 04/05/2018                        Cardiovascular Markers Lab Results  Component Value Date  TROPONINI <0.03 11/16/2014   HGB 13.6 04/05/2018   HCT 39.9 (L) 04/05/2018                         CA Markers No results found for: CEA, CA125, LABCA2                      Note: Lab results reviewed.  Recent Diagnostic Imaging Results  CT ABDOMEN PELVIS W CONTRAST CLINICAL DATA:  Lower abdominal pain radiating to the low back for 4 days.  EXAM: CT ABDOMEN AND PELVIS WITH CONTRAST  TECHNIQUE: Multidetector CT  imaging of the abdomen and pelvis was performed using the standard protocol following bolus administration of intravenous contrast.  CONTRAST:  124m ISOVUE-370 IOPAMIDOL (ISOVUE-370) INJECTION 76%  COMPARISON:  11/07/2014  FINDINGS: Lower chest: Dependent changes in the lung bases. Coronary artery calcifications.  Hepatobiliary: Mild diffuse fatty infiltration of the liver. No focal liver lesions. Gallbladder and bile ducts are unremarkable.  Pancreas: Unremarkable. No pancreatic ductal dilatation or surrounding inflammatory changes.  Spleen: Normal in size without focal abnormality.  Adrenals/Urinary Tract: Adrenal glands are unremarkable. Kidneys are normal, without renal calculi, focal lesion, or hydronephrosis. Bladder wall is not thickened. No filling defects. Minimal left posterior bladder diverticulum.  Stomach/Bowel: Stomach is within normal limits. Appendix appears normal. No evidence of bowel wall thickening, distention, or inflammatory changes.  Vascular/Lymphatic: Aortic atherosclerosis. No enlarged abdominal or pelvic lymph nodes.  Reproductive: Prostate gland is enlarged, measuring 4.7 cm.  Other: No free air or free fluid in the abdomen. Minimal periumbilical hernia containing fat.  Musculoskeletal: Spondylolysis with minimal spondylolisthesis at L5-S1. Healing fractures of the right second and third lumbar transverse processes. Old bilateral rib fractures.  IMPRESSION: 1. Mild fatty infiltration of the liver. 2. Small bladder diverticulum. No bladder wall thickening or filling defect. 3. Aortic atherosclerosis. 4. Enlarged prostate gland. 5. 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  Electronically Signed   By: WLucienne CapersM.D.   On: 04/06/2018 01:52  Complexity Note: Imaging results reviewed. Results shared with Mr. Ostergaard, using Layman's terms.                         Meds   Current Outpatient Medications:  .  cetirizine  (ZYRTEC) 10 MG tablet, Take 1 tablet (10 mg total) by mouth daily., Disp: 30 tablet, Rfl: 11 .  diclofenac sodium (VOLTAREN) 1 % GEL, Apply 4 g topically 4 (four) times daily as needed (left knee and back pain)., Disp: 2 Tube, Rfl: 2 .  Diclofenac Sodium 3 % GEL, Place 1 application onto the skin 3 (three) times daily as needed (left knee pain)., Disp: 2 Tube, Rfl: 2 .  escitalopram (LEXAPRO) 10 MG tablet, Take 10 mg by mouth daily., Disp: , Rfl:  .  fluticasone (FLONASE) 50 MCG/ACT nasal spray, Place 2 sprays into both nostrils daily., Disp: 16 g, Rfl: 11 .  Melatonin 1 MG TABS, Take 1 tablet by mouth at bedtime as needed., Disp: , Rfl:  .  oxyCODONE-acetaminophen (PERCOCET) 10-325 MG tablet, Take 1 tablet by mouth every 8 (eight) hours as needed for pain. For chronic pain Max 75/month To fill on or after: 05/11/18, 06/10/18, 07/10/18, Disp: 75 tablet, Rfl: 0 .  tamsulosin (FLOMAX) 0.4 MG CAPS capsule, Take 1 capsule (0.4 mg total) by mouth daily., Disp: 30 capsule, Rfl: 3 .  pregabalin (LYRICA) 50 MG capsule, 50 mg qhs on week 1,  then 50 mg BID week 2, then 50 mg TID week 3, Disp: 90 capsule, Rfl: 0  ROS  Constitutional: Denies any fever or chills Gastrointestinal: No reported hemesis, hematochezia, vomiting, or acute GI distress Musculoskeletal: Denies any acute onset joint swelling, redness, loss of ROM, or weakness Neurological: No reported episodes of acute onset apraxia, aphasia, dysarthria, agnosia, amnesia, paralysis, loss of coordination, or loss of consciousness  Allergies  Mr. Lefeber has No Known Allergies.  [place]  Drug: Mr. Banh  reports that he does not use drugs. Alcohol:  reports that he does not drink alcohol. Tobacco:  reports that he has been smoking cigarettes.  He has a 30.00 pack-year smoking history. He has never used smokeless tobacco. Medical:  has a past medical history of Allergy, Arthritis, Cancer ([place]), Chronic back pain, Current every day smoker,  Diverticulosis, GERD (gastroesophageal reflux disease), H/O diverticulitis of colon, Restless leg syndrome, and Sciatic pain. Surgical: Mr. Tudisco  has a past surgical history that includes Tonsillectomy and adenoidectomy; Total knee arthroplasty (Left, 10/14/2017); skin cancer removal; and Joint replacement (Left, 10/14/2018). Family: family history includes Alzheimer's disease in his father; Cancer (age of onset: 38) in his sister; Diabetes in his sister; Healthy in his brother, son, son, son, and son; Heart disease in his brother; Hypertension in his mother; Prostate cancer (age of onset: 2) in his father.  Constitutional Exam  General appearance: Well nourished, well developed, and well hydrated. In no apparent acute distress Vitals:   05/10/18 0816 05/10/18 0817  BP:  (!) 151/92  Pulse: 68   Resp: 18   Temp: 98.1 F (36.7 C)   SpO2: 98%   Weight: 215 lb (97.5 kg)   Height: '6\' 1"'  (1.854 m)    BMI Assessment: Estimated body mass index is 28.37 kg/m as calculated from the following:   Height as of this encounter: '6\' 1"'  (1.854 m).   Weight as of this encounter: 215 lb (97.5 kg).  BMI interpretation table: BMI level Category Range association with higher incidence of chronic pain  <18 kg/m2 Underweight   18.5-24.9 kg/m2 Ideal body weight   25-29.9 kg/m2 Overweight Increased incidence by 20%  30-34.9 kg/m2 Obese (Class I) Increased incidence by 68%  35-39.9 kg/m2 Severe obesity (Class II) Increased incidence by 136%  >40 kg/m2 Extreme obesity (Class III) Increased incidence by 254%   Patient's current BMI Ideal Body weight  Body mass index is 28.37 kg/m. Ideal body weight: 79.9 kg (176 lb 2.4 oz) Adjusted ideal body weight: 86.9 kg (191 lb 11 oz)   BMI Readings from Last 4 Encounters:  05/10/18 28.37 kg/m  04/04/18 28.84 kg/m  03/17/18 27.86 kg/m  03/14/18 30.61 kg/m   Wt Readings from Last 4 Encounters:  05/10/18 215 lb (97.5 kg)  04/04/18 218 lb 9.6 oz (99.2 kg)   03/17/18 217 lb (98.4 kg)  03/14/18 232 lb (105.2 kg)  Psych/Mental status: Alert, oriented x 3 (person, place, & time)       Eyes: PERLA Respiratory: No evidence of acute respiratory distress  Cervical Spine Area Exam  Skin & Axial Inspection: No masses, redness, edema, swelling, or associated skin lesions Alignment: Symmetrical Functional ROM: Unrestricted ROM      Stability: No instability detected Muscle Tone/Strength: Functionally intact. No obvious neuro-muscular anomalies detected. Sensory (Neurological): Unimpaired Palpation: No palpable anomalies              Upper Extremity (UE) Exam    Side: Right upper extremity  Side: Left upper extremity  Skin & Extremity Inspection: Skin color, temperature, and hair growth are WNL. No peripheral edema or cyanosis. No masses, redness, swelling, asymmetry, or associated skin lesions. No contractures.  Skin & Extremity Inspection: Skin color, temperature, and hair growth are WNL. No peripheral edema or cyanosis. No masses, redness, swelling, asymmetry, or associated skin lesions. No contractures.  Functional ROM: Unrestricted ROM          Functional ROM: Unrestricted ROM          Muscle Tone/Strength: Functionally intact. No obvious neuro-muscular anomalies detected.  Muscle Tone/Strength: Functionally intact. No obvious neuro-muscular anomalies detected.  Sensory (Neurological): Unimpaired          Sensory (Neurological): Unimpaired          Palpation: No palpable anomalies              Palpation: No palpable anomalies              Provocative Test(s):  Phalen's test: deferred Tinel's test: deferred Apley's scratch test (touch opposite shoulder):  Action 1 (Across chest): deferred Action 2 (Overhead): deferred Action 3 (LB reach): deferred   Provocative Test(s):  Phalen's test: deferred Tinel's test: deferred Apley's scratch test (touch opposite shoulder):  Action 1 (Across chest): deferred Action 2 (Overhead): deferred Action 3 (LB  reach): deferred    Thoracic Spine Area Exam  Skin & Axial Inspection: No masses, redness, or swelling Alignment: Symmetrical Functional ROM: Unrestricted ROM Stability: No instability detected Muscle Tone/Strength: Functionally intact. No obvious neuro-muscular anomalies detected. Sensory (Neurological): Unimpaired Muscle strength & Tone: No palpable anomalies Lumbar Spine Area Exam  Skin & Axial Inspection: No masses, redness, or swelling Alignment: Symmetrical Functional ROM: Pain restricted ROM       Stability: No instability detected Muscle Tone/Strength: Functionally intact. No obvious neuro-muscular anomalies detected. Sensory (Neurological): Musculoskeletal pain pattern Palpation: Complains of area being tender to palpation Right Fist Percussion Test Provocative Tests: Lumbar Hyperextension/rotation test: Positive bilaterally for facet joint pain. Lumbar quadrant test (Kemp's test): (+) bilaterally for facet joint pain. Lumbar Lateral bending test: (+) due to pain. on the right he does not feel like the knee is radiating from Robaxin as Patrick's Maneuver: deferred today                   FABER test: deferred today       Thigh-thrust test: deferred today       S-I compression test: deferred today       S-I distraction test: deferred today        Gait & Posture Assessment  Ambulation: Unassisted Gait: Relatively normal for age and body habitus Posture: WNL   Lower Extremity Exam    Side: Right lower extremity  Side: Left lower extremity  Stability: No instability observed          Stability: No instability observed          Skin & Extremity Inspection: Skin color, temperature, and hair growth are WNL. No peripheral edema or cyanosis. No masses, redness, swelling, asymmetry, or associated skin lesions. No contractures.  Skin & Extremity Inspection: Skin color, temperature, and hair growth are WNL. No peripheral edema or cyanosis. No masses, redness, swelling,  asymmetry, or associated skin lesions. No contractures.  Functional ROM: Unrestricted ROM                  Functional ROM: Unrestricted ROM  Muscle Tone/Strength: Functionally intact. No obvious neuro-muscular anomalies detected.  Muscle Tone/Strength: Functionally intact. No obvious neuro-muscular anomalies detected.  Sensory (Neurological): Unimpaired  Sensory (Neurological): Unimpaired  Palpation: No palpable anomalies  Palpation: No palpable anomalies    Assessment  Primary Diagnosis & Pertinent Problem List: The primary encounter diagnosis was Chronic pain syndrome. Diagnoses of Lumbar radiculopathy, Lumbar degenerative disc disease, Lumbar foraminal stenosis, History of left knee replacement, and Primary osteoarthritis of both knees were also pertinent to this visit.  Status Diagnosis  Persistent Persistent Persistent 1. Chronic pain syndrome   2. Lumbar radiculopathy   3. Lumbar degenerative disc disease   4. Lumbar foraminal stenosis   5. History of left knee replacement   6. Primary osteoarthritis of both knees     General Recommendations: The pain condition that the patient suffers from is best treated with a multidisciplinary approach that involves an increase in physical activity to prevent de-conditioning and worsening of the pain cycle, as well as psychological counseling (formal and/or informal) to address the co-morbid psychological affects of pain. Treatment will often involve judicious use of pain medications and interventional procedures to decrease the pain, allowing the patient to participate in the physical activity that will ultimately produce long-lasting pain reductions. The goal of the multidisciplinary approach is to return the patient to a higher level of overall function and to restore their ability to perform activities of daily living.  62 year old male with a history of chronic pain syndrome secondary lumbar radiculopathy, lumbar  degenerative disc disease, lumbar foraminal stenosis as well as persistent left knee pain secondary to left knee osteoarthritis status post left knee replacement.  Patient follows up today stating that he has lost his job given his low and ability to perform secondary to his pain.  Patient is distraught about this.  Patient's pain from his TP fractures sustained at the end of May 2019 have improved.  Given that we are greater than 2 months out from his fall and transverse process fracture, will begin weaning his Percocet.  We will decrease his monthly allotment from 90 tablets/month to 75.  I recommended the patient utilizes medication no more than 2 times a day as needed and only utilized a third dose for very severe pain.  Future considerations: Lumbar facet medial branch nerve blocks for lumbar spondylosis as noted on most recent CT, left knee genicular nerve block, repeat lumbar ESI #3  Plan: -Prescription for oxycodone at reduced monthly quantity of 75. -Prescription for Lyrica with titration instructions as below for patient's worsening lower externally neuropathic pain  Plan of Care  Pharmacotherapy (Medications Ordered): Meds ordered this encounter  Medications  . DISCONTD: oxyCODONE-acetaminophen (PERCOCET) 10-325 MG tablet    Sig: Take 1 tablet by mouth every 8 (eight) hours as needed for pain. For chronic pain Max 75/month To fill on or after: 05/11/18, 06/10/18, 07/10/18    Dispense:  75 tablet    Refill:  0    Do not place this medication, or any other prescription from our practice, on "Automatic Refill". Patient may have prescription filled one day early if pharmacy is closed on scheduled refill date.   To last for 30 days from fill date To fill on or after: 03/18/18, 04/17/18  . DISCONTD: pregabalin (LYRICA) 50 MG capsule    Sig: 50 mg qhs on week 1, then 50 mg BID week 2, then 50 mg TID week 3    Dispense:  90 capsule    Refill:  0  Do not place this medication, or any other  prescription from our practice, on "Automatic Refill". Patient may have prescription filled one day early if pharmacy is closed on scheduled refill date.  Marland Kitchen DISCONTD: oxyCODONE-acetaminophen (PERCOCET) 10-325 MG tablet    Sig: Take 1 tablet by mouth every 8 (eight) hours as needed for pain. For chronic pain Max 75/month To fill on or after: 05/11/18, 06/10/18, 07/10/18    Dispense:  75 tablet    Refill:  0    Do not place this medication, or any other prescription from our practice, on "Automatic Refill". Patient may have prescription filled one day early if pharmacy is closed on scheduled refill date.   To last for 30 days from fill date To fill on or after: 03/18/18, 04/17/18  . DISCONTD: oxyCODONE-acetaminophen (PERCOCET) 10-325 MG tablet    Sig: Take 1 tablet by mouth every 8 (eight) hours as needed for pain. For chronic pain Max 75/month To fill on or after: 05/11/18, 06/10/18, 07/10/18    Dispense:  75 tablet    Refill:  0    Do not place this medication, or any other prescription from our practice, on "Automatic Refill". Patient may have prescription filled one day early if pharmacy is closed on scheduled refill date.   To last for 30 days from fill date To fill on or after: 03/18/18, 04/17/18  . DISCONTD: oxyCODONE-acetaminophen (PERCOCET) 10-325 MG tablet    Sig: Take 1 tablet by mouth every 8 (eight) hours as needed for pain. For chronic pain Max 75/month To fill on or after: 05/11/18, 06/10/18, 07/10/18    Dispense:  75 tablet    Refill:  0    Do not place this medication, or any other prescription from our practice, on "Automatic Refill". Patient may have prescription filled one day early if pharmacy is closed on scheduled refill date.   To last for 30 days from fill date  . DISCONTD: oxyCODONE-acetaminophen (PERCOCET) 10-325 MG tablet    Sig: Take 1 tablet by mouth every 8 (eight) hours as needed for pain. For chronic pain Max 75/month To fill on or after: 05/11/18, 06/10/18, 07/10/18     Dispense:  75 tablet    Refill:  0    Do not place this medication, or any other prescription from our practice, on "Automatic Refill". Patient may have prescription filled one day early if pharmacy is closed on scheduled refill date.   To last for 30 days from fill date  . oxyCODONE-acetaminophen (PERCOCET) 10-325 MG tablet    Sig: Take 1 tablet by mouth every 8 (eight) hours as needed for pain. For chronic pain Max 75/month To fill on or after: 05/11/18, 06/10/18, 07/10/18    Dispense:  75 tablet    Refill:  0    Do not place this medication, or any other prescription from our practice, on "Automatic Refill". Patient may have prescription filled one day early if pharmacy is closed on scheduled refill date.   To last for 30 days from fill date  . pregabalin (LYRICA) 50 MG capsule    Sig: 50 mg qhs on week 1, then 50 mg BID week 2, then 50 mg TID week 3    Dispense:  90 capsule    Refill:  0    Do not place this medication, or any other prescription from our practice, on "Automatic Refill". Patient may have prescription filled one day early if pharmacy is closed on scheduled refill date.   Time  Note: Greater than 50% of the 25 minute(s) of face-to-face time spent with Mr. Hynes, was spent in counseling/coordination of care regarding: Mr. Mich's primary cause of pain, the treatment plan, treatment alternatives, medication side effects, the opioid analgesic risks and possible complications, the results, interpretation and significance of  his recent diagnostic interventional treatment(s), the appropriate use of his medications, realistic expectations, the goals of pain management (increased in functionality), the medication agreement and the patient's responsibilities when it comes to controlled substances.   Provider-requested follow-up: Return in about 3 months (around 08/10/2018) for Medication Management.  Future Appointments  Date Time Provider [place]  05/18/2018   9:00 AM Mikey College, NP [facility] None    Primary Care Physician: Mikey College, NP Location: [facility] Note by: [name], M.D Date: 05/10/2018; Time: 8:37 AM  There are no Patient Instructions on file for this visit.

## 2018-05-10 NOTE — Progress Notes (Signed)
Nursing Pain Medication Assessment:  Safety precautions to be maintained throughout the outpatient stay will include: orient to surroundings, keep bed in low position, maintain call bell within reach at all times, provide assistance with transfer out of bed and ambulation.  Medication Inspection Compliance: Pill count conducted under aseptic conditions, in front of the patient. Neither the pills nor the bottle was removed from the patient's sight at any time. Once count was completed pills were immediately returned to the patient in their original bottle.  Medication: Oxycodone/APAP Pill/Patch Count: 1 of 90 pills remain Pill/Patch Appearance: Markings consistent with prescribed medication Bottle Appearance: Standard pharmacy container. Clearly labeled. Filled Date: 06/ 29 / 2019 Last Medication intake:  Yesterday

## 2018-05-12 ENCOUNTER — Telehealth: Payer: Self-pay

## 2018-05-12 NOTE — Telephone Encounter (Signed)
Patient advised to go to [email or web address] and priint coupon. He said he will do that.

## 2018-05-12 NOTE — Telephone Encounter (Signed)
Pt called about meds, RX wrote for 07/23 but Pharm wont refill without call from us,they say its to early. Also Lyrica is $700.Pharmacy told pt we might have a way to get it cheaper, Please call pt back.

## 2018-05-12 NOTE — Telephone Encounter (Signed)
It is too soon to fill Percocet, last fill was 04-16-18. Patient notified.  Crystal, he cannot afford Lyrica. Dr. Gaylyn Cheers. Do you have suggestion of alternative? Should he just DC Lyrica until next visit with Dr. Holley Raring? Can also ask Dr. Holley Raring about this Monday.  [phone]

## 2018-05-12 NOTE — Telephone Encounter (Signed)
Lyrica went generic  We have discount cards $4 Please try to locate Thanks

## 2018-05-13 ENCOUNTER — Telehealth: Payer: Self-pay

## 2018-05-13 ENCOUNTER — Telehealth: Payer: Self-pay | Admitting: *Deleted

## 2018-05-13 NOTE — Telephone Encounter (Signed)
Pt called after nurses left and wanted to speak with someone about meds that he just spoke to.

## 2018-05-13 NOTE — Telephone Encounter (Signed)
Called patient back and he stated his pharm needed to call them. I call [facility] and stated it was ok to fill medication and that he would be paying out of pocket r/t not having insurance.  Called patient back to let him know that Pharm would have his medication

## 2018-05-16 NOTE — Telephone Encounter (Signed)
Talked with Pharmacy and I talked with the patient about his medications an Friday.

## 2018-05-18 ENCOUNTER — Ambulatory Visit: Payer: BLUE CROSS/BLUE SHIELD | Admitting: Nurse Practitioner

## 2018-05-25 ENCOUNTER — Telehealth: Payer: Self-pay | Admitting: *Deleted

## 2018-05-25 ENCOUNTER — Telehealth: Payer: Self-pay

## 2018-05-25 NOTE — Telephone Encounter (Signed)
Patient advised of Dr. Elwyn Lade advice. Will schedule appointment next week.

## 2018-05-25 NOTE — Telephone Encounter (Signed)
Anthony Nguyen called and stated " I am tired of hurting all the time,the shot's last a month or two and the pain is back. I can do the shot's but I would like it fixed if that's possible? Surgery. Can Dr Holley Raring call and speak with Me"

## 2018-05-25 NOTE — Telephone Encounter (Signed)
-----   Message from [name], MD sent at 05/25/2018  9:51 AM EDT ----- Regarding: call back Patient probably needs to be evaluated. As long as he is not having any new onset LE weakness or bowel/bladder issues, no surgical emergency. He can see me next week for pain exacerbation, and we can discuss IM Toradol/Norflex vs steroid taper vs referral to [facility] [place] for surgical consult.   Thanks

## 2018-05-25 NOTE — Telephone Encounter (Signed)
Attempted to call to advise of Dr. Elwyn Lade advice. Message left. [phone]

## 2018-05-25 NOTE — Telephone Encounter (Signed)
Dr. Holley Raring, Do you have any suggestions before I call this patient?

## 2018-06-02 ENCOUNTER — Ambulatory Visit: Payer: Self-pay | Admitting: Student in an Organized Health Care Education/Training Program

## 2018-08-09 ENCOUNTER — Encounter: Payer: Self-pay | Admitting: Student in an Organized Health Care Education/Training Program

## 2018-08-09 ENCOUNTER — Ambulatory Visit
Payer: Self-pay | Attending: Student in an Organized Health Care Education/Training Program | Admitting: Student in an Organized Health Care Education/Training Program

## 2018-08-09 ENCOUNTER — Other Ambulatory Visit: Payer: Self-pay

## 2018-08-09 VITALS — BP 131/69 | HR 103 | Temp 98.1°F | Resp 18 | Ht 73.0 in | Wt 225.0 lb

## 2018-08-09 DIAGNOSIS — M170 Bilateral primary osteoarthritis of knee: Secondary | ICD-10-CM

## 2018-08-09 DIAGNOSIS — M5116 Intervertebral disc disorders with radiculopathy, lumbar region: Secondary | ICD-10-CM | POA: Insufficient documentation

## 2018-08-09 DIAGNOSIS — G894 Chronic pain syndrome: Secondary | ICD-10-CM

## 2018-08-09 DIAGNOSIS — Z96652 Presence of left artificial knee joint: Secondary | ICD-10-CM

## 2018-08-09 DIAGNOSIS — Z79899 Other long term (current) drug therapy: Secondary | ICD-10-CM | POA: Insufficient documentation

## 2018-08-09 DIAGNOSIS — M48061 Spinal stenosis, lumbar region without neurogenic claudication: Secondary | ICD-10-CM | POA: Insufficient documentation

## 2018-08-09 DIAGNOSIS — N401 Enlarged prostate with lower urinary tract symptoms: Secondary | ICD-10-CM | POA: Insufficient documentation

## 2018-08-09 DIAGNOSIS — Z79891 Long term (current) use of opiate analgesic: Secondary | ICD-10-CM | POA: Insufficient documentation

## 2018-08-09 DIAGNOSIS — H8110 Benign paroxysmal vertigo, unspecified ear: Secondary | ICD-10-CM | POA: Insufficient documentation

## 2018-08-09 DIAGNOSIS — M5416 Radiculopathy, lumbar region: Secondary | ICD-10-CM

## 2018-08-09 DIAGNOSIS — Z5181 Encounter for therapeutic drug level monitoring: Secondary | ICD-10-CM | POA: Insufficient documentation

## 2018-08-09 DIAGNOSIS — K219 Gastro-esophageal reflux disease without esophagitis: Secondary | ICD-10-CM | POA: Insufficient documentation

## 2018-08-09 DIAGNOSIS — N323 Diverticulum of bladder: Secondary | ICD-10-CM | POA: Insufficient documentation

## 2018-08-09 DIAGNOSIS — F17210 Nicotine dependence, cigarettes, uncomplicated: Secondary | ICD-10-CM | POA: Insufficient documentation

## 2018-08-09 DIAGNOSIS — M5136 Other intervertebral disc degeneration, lumbar region: Secondary | ICD-10-CM

## 2018-08-09 MED ORDER — OXYCODONE HCL 10 MG PO TABS: 10.0000 mg | ORAL_TABLET | Freq: Three times a day (TID) | ORAL | 0 refills | Status: DC | PRN

## 2018-08-09 NOTE — Progress Notes (Signed)
Nursing Pain Medication Assessment:  Safety precautions to be maintained throughout the outpatient stay will include: orient to surroundings, keep bed in low position, maintain call bell within reach at all times, provide assistance with transfer out of bed and ambulation.   Medication Inspection Compliance: Pill count conducted under aseptic conditions, in front of the patient. Neither the pills nor the bottle was removed from the patient's sight at any time. Once count was completed pills were immediately returned to the patient in their original bottle.  Medication: Oxycodone IR Pill/Patch Count: 0 of 75 pills remain Pill/Patch Appearance: Markings consistent with prescribed medication Bottle Appearance: Standard pharmacy container. Clearly labeled. Filled Date: 09 / 23 / 2019 Last Medication intake:  Today

## 2018-08-09 NOTE — Patient Instructions (Signed)
You have been given 3 prescriptions for Oxycodone 10mg  to last until 11/07/2018.

## 2018-08-09 NOTE — Progress Notes (Signed)
Patient's Name: Anthony Nguyen  MRN: [id number]  Referring Provider: Mikey College, *  DOB: [birth year]  PCP: Mikey College, NP  DOS: 08/09/2018  Note by: [name], MD  Service setting: Ambulatory outpatient  Specialty: Interventional Pain Management  Location: [facility] (AMB) Pain Management Facility    Patient type: Established   Primary Reason(s) for Visit: Encounter for prescription drug management. (Level of risk: moderate)  CC: Medication Refill  HPI  Anthony Nguyen is a 62 y.o. year old, male patient, who comes today for a medication management evaluation. He has Sleep disturbance; Allergy; GERD (gastroesophageal reflux disease); Tobacco abuse counseling; Ringworm; Chronic nasal congestion; Benign paroxysmal positional vertigo; Primary osteoarthritis of left knee; Lumbar radiculopathy; Lumbar degenerative disc disease; Lumbar foraminal stenosis; History of left knee replacement; Primary osteoarthritis of both knees; Chronic pain syndrome; and Benign prostatic hyperplasia with urinary frequency on their problem list. His primarily concern today is the Medication Refill  Pain Assessment: Location: Lower, Right, Left Back Radiating: Radiates from lower back down to sides of legs and feet bilateral  Onset: More than a month ago Duration: Chronic pain Quality: Aching, Sharp, Burning, Constant Severity: 7 /10 (subjective, self-reported pain score)  Note: Reported level is compatible with observation.                         When using our objective Pain Scale, levels between 6 and 10/10 are said to belong in an emergency room, as it progressively worsens from a 6/10, described as severely limiting, requiring emergency care not usually available at an outpatient pain management facility. At a 6/10 level, communication becomes difficult and requires great effort. Assistance to reach the emergency department may be required. Facial flushing and profuse sweating along with  potentially dangerous increases in heart rate and blood pressure will be evident. Effect on ADL: "Just have to keep going"  Timing: Constant Modifying factors: Medications and rest  BP: 131/69  HR: (!) 103  Anthony Nguyen was last scheduled for an appointment on 06/02/2018 for medication management. During today's appointment we reviewed Anthony Nguyen's chronic pain status, as well as his outpatient medication regimen.  The patient  reports that he does not use drugs. His body mass index is 29.69 kg/m.  Further details on both, my assessment(s), as well as the proposed treatment plan, please see below.  Controlled Substance Pharmacotherapy Assessment REMS (Risk Evaluation and Mitigation Strategy)  Analgesic: Percocet 10 mg twice daily to 3 times daily as needed, quantity 75/month MME/day: 30-45 mg/day.  Janne Napoleon, RN  08/09/2018  8:57 AM  Sign at close encounter Nursing Pain Medication Assessment:  Safety precautions to be maintained throughout the outpatient stay will include: orient to surroundings, keep bed in low position, maintain call bell within reach at all times, provide assistance with transfer out of bed and ambulation.   Medication Inspection Compliance: Pill count conducted under aseptic conditions, in front of the patient. Neither the pills nor the bottle was removed from the patient's sight at any time. Once count was completed pills were immediately returned to the patient in their original bottle.  Medication: Oxycodone IR Pill/Patch Count: 0 of 75 pills remain Pill/Patch Appearance: Markings consistent with prescribed medication Bottle Appearance: Standard pharmacy container. Clearly labeled. Filled Date: 09 / 23 / 2019 Last Medication intake:  Today Pharmacokinetics: Liberation and absorption (onset of action): WNL Distribution (time to peak effect): WNL Metabolism and excretion (duration of action): WNL  Pharmacodynamics: Desired  effects: Analgesia: Anthony Nguyen reports 50% benefit. Functional ability: Patient reports that medication allows him to accomplish basic ADLs Clinically meaningful improvement in function (CMIF): Sustained CMIF goals met Perceived effectiveness: Described as relatively effective but with some room for improvement Undesirable effects: Side-effects or Adverse reactions: None reported Monitoring: [place] PMP: Online review of the past 71-monthperiod conducted. Compliant with practice rules and regulations Last UDS on record: Summary  Date Value Ref Range Status  01/20/2018 FINAL  Final    Comment:    ==================================================================== TOXASSURE SELECT 13 (MW) ==================================================================== Test                             Result       Flag       Units Drug Present and Declared for Prescription Verification   Oxycodone                      525          EXPECTED   ng/mg creat   Oxymorphone                    331          EXPECTED   ng/mg creat   Noroxycodone                   2011         EXPECTED   ng/mg creat   Noroxymorphone                 174          EXPECTED   ng/mg creat    Sources of oxycodone are scheduled prescription medications.    Oxymorphone, noroxycodone, and noroxymorphone are expected    metabolites of oxycodone. Oxymorphone is also available as a    scheduled prescription medication. ==================================================================== Test                      Result    Flag   Units      Ref Range   Creatinine              159              mg/dL      >=20 ==================================================================== Declared Medications:  The flagging and interpretation on this report are based on the  following declared medications.  Unexpected results may arise from  inaccuracies in the declared medications.  **Note: The testing scope of this panel includes these  medications:  Oxycodone (Percocet)  **Note: The testing scope of this panel does not include following  reported medications:  Acetaminophen (Percocet)  Aspirin (Aspirin 81)  Diclofenac  Escitalopram (Lexapro)  Fluticasone (Flonase)  Melatonin ==================================================================== For clinical consultation, please call ([phone] ====================================================================    UDS interpretation: Compliant          Medication Assessment Form: Reviewed. Patient indicates being compliant with therapy Treatment compliance: Compliant Risk Assessment Profile: Aberrant behavior: See prior evaluations. None observed or detected today Comorbid factors increasing risk of overdose: See prior notes. No additional risks detected today Opioid risk tool (ORT) (Total Score): 0 Personal History of Substance Abuse (SUD-Substance use disorder):  Alcohol: Negative  Illegal Drugs: Negative  Rx Drugs: Negative  ORT Risk Level calculation: Low Risk Risk of substance use disorder (SUD): Low Opioid Risk Tool - 08/09/18 0848      Family  History of Substance Abuse   Alcohol  Negative    Illegal Drugs  Negative    Rx Drugs  Negative      Personal History of Substance Abuse   Alcohol  Negative    Illegal Drugs  Negative    Rx Drugs  Negative      Age   Age between 66-45 years   No      History of Preadolescent Sexual Abuse   History of Preadolescent Sexual Abuse  Negative or Male      Psychological Disease   Psychological Disease  Negative    Depression  Negative      Total Score   Opioid Risk Tool Scoring  0    Opioid Risk Interpretation  Low Risk      ORT Scoring interpretation table:  Score <3 = Low Risk for SUD  Score between 4-7 = Moderate Risk for SUD  Score >8 = High Risk for Opioid Abuse   Risk Mitigation Strategies:  Patient Counseling: Covered Patient-Prescriber Agreement (PPA): Present and active  Notification  to other healthcare providers: Done  Pharmacologic Plan: Cross rotation to oxycodone 10 mg twice daily to 3 times daily as needed, quantity 75/month             Laboratory Chemistry  Inflammation Markers (CRP: Acute Phase) (ESR: Chronic Phase) Lab Results  Component Value Date   ESRSEDRATE 30 (H) 09/29/2017                         Rheumatology Markers No results found for: RF, ANA, LABURIC, URICUR, LYMEIGGIGMAB, LYMEABIGMQN, HLAB27                      Renal Function Markers Lab Results  Component Value Date   BUN 20 04/05/2018   CREATININE 1.07 04/05/2018   GFRAA >60 04/05/2018   GFRNONAA >60 04/05/2018                             Hepatic Function Markers Lab Results  Component Value Date   AST 26 04/05/2018   ALT 19 04/05/2018   ALBUMIN 4.7 04/05/2018   ALKPHOS 66 04/05/2018   LIPASE 59 (H) 04/05/2018                        Electrolytes Lab Results  Component Value Date   NA 142 04/05/2018   K 3.3 (L) 04/05/2018   CL 109 04/05/2018   CALCIUM 9.4 04/05/2018                        Neuropathy Markers No results found for: VITAMINB12, FOLATE, HGBA1C, HIV                      CNS Tests No results found for: COLORCSF, APPEARCSF, RBCCOUNTCSF, WBCCSF, POLYSCSF, LYMPHSCSF, EOSCSF, PROTEINCSF, GLUCCSF, JCVIRUS, CSFOLI, IGGCSF                      Bone Pathology Markers No results found for: VD25OH, [id number], [id number], DJ2426ST4, 25OHVITD1, 25OHVITD2, 25OHVITD3, TESTOFREE, TESTOSTERONE                       Coagulation Parameters Lab Results  Component Value Date   INR 0.96 09/29/2017   LABPROT 12.7 09/29/2017   APTT 31 09/29/2017  PLT 183 04/05/2018                        Cardiovascular Markers Lab Results  Component Value Date   TROPONINI <0.03 11/16/2014   HGB 13.6 04/05/2018   HCT 39.9 (L) 04/05/2018                         CA Markers No results found for: CEA, CA125, LABCA2                      Note: Lab results reviewed.  Recent Diagnostic  Imaging Results  CT ABDOMEN PELVIS W CONTRAST CLINICAL DATA:  Lower abdominal pain radiating to the low back for 4 days.  EXAM: CT ABDOMEN AND PELVIS WITH CONTRAST  TECHNIQUE: Multidetector CT imaging of the abdomen and pelvis was performed using the standard protocol following bolus administration of intravenous contrast.  CONTRAST:  149m ISOVUE-370 IOPAMIDOL (ISOVUE-370) INJECTION 76%  COMPARISON:  11/07/2014  FINDINGS: Lower chest: Dependent changes in the lung bases. Coronary artery calcifications.  Hepatobiliary: Mild diffuse fatty infiltration of the liver. No focal liver lesions. Gallbladder and bile ducts are unremarkable.  Pancreas: Unremarkable. No pancreatic ductal dilatation or surrounding inflammatory changes.  Spleen: Normal in size without focal abnormality.  Adrenals/Urinary Tract: Adrenal glands are unremarkable. Kidneys are normal, without renal calculi, focal lesion, or hydronephrosis. Bladder wall is not thickened. No filling defects. Minimal left posterior bladder diverticulum.  Stomach/Bowel: Stomach is within normal limits. Appendix appears normal. No evidence of bowel wall thickening, distention, or inflammatory changes.  Vascular/Lymphatic: Aortic atherosclerosis. No enlarged abdominal or pelvic lymph nodes.  Reproductive: Prostate gland is enlarged, measuring 4.7 cm.  Other: No free air or free fluid in the abdomen. Minimal periumbilical hernia containing fat.  Musculoskeletal: Spondylolysis with minimal spondylolisthesis at L5-S1. Healing fractures of the right second and third lumbar transverse processes. Old bilateral rib fractures.  IMPRESSION: 1. Mild fatty infiltration of the liver. 2. Small bladder diverticulum. No bladder wall thickening or filling defect. 3. Aortic atherosclerosis. 4. Enlarged prostate gland. 5. 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  Electronically Signed   By: WLucienne CapersM.D.   On:  04/06/2018 01:52  Complexity Note: Imaging results reviewed. Results shared with Mr. Rikard, using Layman's terms.                         Meds   Current Outpatient Medications:  .  cetirizine (ZYRTEC) 10 MG tablet, Take 1 tablet (10 mg total) by mouth daily., Disp: 30 tablet, Rfl: 11 .  diclofenac sodium (VOLTAREN) 1 % GEL, Apply 4 g topically 4 (four) times daily as needed (left knee and back pain)., Disp: 2 Tube, Rfl: 2 .  Diclofenac Sodium 3 % GEL, Place 1 application onto the skin 3 (three) times daily as needed (left knee pain)., Disp: 2 Tube, Rfl: 2 .  escitalopram (LEXAPRO) 10 MG tablet, Take 10 mg by mouth daily., Disp: , Rfl:  .  fluticasone (FLONASE) 50 MCG/ACT nasal spray, Place 2 sprays into both nostrils daily., Disp: 16 g, Rfl: 11 .  Melatonin 1 MG TABS, Take 1 tablet by mouth at bedtime as needed., Disp: , Rfl:  .  tamsulosin (FLOMAX) 0.4 MG CAPS capsule, Take 1 capsule (0.4 mg total) by mouth daily., Disp: 30 capsule, Rfl: 3 .  Oxycodone HCl 10 MG TABS, Take 1 tablet (10 mg total) by mouth  3 (three) times daily as needed. Max 75 To fill on or after: 08-09-2018, 09-08-2018, 10-07-2018, Disp: 75 tablet, Rfl: 0  ROS  Constitutional: Denies any fever or chills Gastrointestinal: No reported hemesis, hematochezia, vomiting, or acute GI distress Musculoskeletal: Denies any acute onset joint swelling, redness, loss of ROM, or weakness Neurological: No reported episodes of acute onset apraxia, aphasia, dysarthria, agnosia, amnesia, paralysis, loss of coordination, or loss of consciousness  Allergies  Mr. Tang has No Known Allergies.  [place]  Drug: Mr. Albaugh  reports that he does not use drugs. Alcohol:  reports that he does not drink alcohol. Tobacco:  reports that he has been smoking cigarettes. He has a 30.00 pack-year smoking history. He has never used smokeless tobacco. Medical:  has a past medical history of Allergy, Arthritis, Cancer ([place]), Chronic back pain,  Current every day smoker, Diverticulosis, GERD (gastroesophageal reflux disease), H/O diverticulitis of colon, Restless leg syndrome, and Sciatic pain. Surgical: Mr. Smigiel  has a past surgical history that includes Tonsillectomy and adenoidectomy; Total knee arthroplasty (Left, 10/14/2017); skin cancer removal; and Joint replacement (Left, 10/14/2018). Family: family history includes Alzheimer's disease in his father; Cancer (age of onset: 68) in his sister; Diabetes in his sister; Healthy in his brother, son, son, son, and son; Heart disease in his brother; Hypertension in his mother; Prostate cancer (age of onset: 1) in his father.  Constitutional Exam  General appearance: Well nourished, well developed, and well hydrated. In no apparent acute distress Vitals:   08/09/18 0845  BP: 131/69  Pulse: (!) 103  Resp: 18  Temp: 98.1 F (36.7 C)  SpO2: 99%  Weight: 225 lb (102.1 kg)  Height: 6' 1"  (1.854 m)   BMI Assessment: Estimated body mass index is 29.69 kg/m as calculated from the following:   Height as of this encounter: 6' 1"  (1.854 m).   Weight as of this encounter: 225 lb (102.1 kg).  BMI interpretation table: BMI level Category Range association with higher incidence of chronic pain  <18 kg/m2 Underweight   18.5-24.9 kg/m2 Ideal body weight   25-29.9 kg/m2 Overweight Increased incidence by 20%  30-34.9 kg/m2 Obese (Class I) Increased incidence by 68%  35-39.9 kg/m2 Severe obesity (Class II) Increased incidence by 136%  >40 kg/m2 Extreme obesity (Class III) Increased incidence by 254%   Patient's current BMI Ideal Body weight  Body mass index is 29.69 kg/m. Ideal body weight: 79.9 kg (176 lb 2.4 oz) Adjusted ideal body weight: 88.8 kg (195 lb 11 oz)   BMI Readings from Last 4 Encounters:  08/09/18 29.69 kg/m  05/10/18 28.37 kg/m  04/04/18 28.84 kg/m  03/17/18 27.86 kg/m   Wt Readings from Last 4 Encounters:  08/09/18 225 lb (102.1 kg)  05/10/18 215 lb (97.5  kg)  04/04/18 218 lb 9.6 oz (99.2 kg)  03/17/18 217 lb (98.4 kg)  Psych/Mental status: Alert, oriented x 3 (person, place, & time)       Eyes: PERLA Respiratory: No evidence of acute respiratory distress  Cervical Spine Area Exam  Skin & Axial Inspection: No masses, redness, edema, swelling, or associated skin lesions Alignment: Symmetrical Functional ROM: Unrestricted ROM      Stability: No instability detected Muscle Tone/Strength: Functionally intact. No obvious neuro-muscular anomalies detected. Sensory (Neurological): Unimpaired Palpation: No palpable anomalies              Upper Extremity (UE) Exam    Side: Right upper extremity  Side: Left upper extremity  Skin & Extremity Inspection: Skin  color, temperature, and hair growth are WNL. No peripheral edema or cyanosis. No masses, redness, swelling, asymmetry, or associated skin lesions. No contractures.  Skin & Extremity Inspection: Skin color, temperature, and hair growth are WNL. No peripheral edema or cyanosis. No masses, redness, swelling, asymmetry, or associated skin lesions. No contractures.  Functional ROM: Unrestricted ROM          Functional ROM: Unrestricted ROM          Muscle Tone/Strength: Functionally intact. No obvious neuro-muscular anomalies detected.  Muscle Tone/Strength: Functionally intact. No obvious neuro-muscular anomalies detected.  Sensory (Neurological): Unimpaired          Sensory (Neurological): Unimpaired          Palpation: No palpable anomalies              Palpation: No palpable anomalies              Provocative Test(s):  Phalen's test: deferred Tinel's test: deferred Apley's scratch test (touch opposite shoulder):  Action 1 (Across chest): deferred Action 2 (Overhead): deferred Action 3 (LB reach): deferred   Provocative Test(s):  Phalen's test: deferred Tinel's test: deferred Apley's scratch test (touch opposite shoulder):  Action 1 (Across chest): deferred Action 2 (Overhead):  deferred Action 3 (LB reach): deferred    Thoracic Spine Area Exam  Skin & Axial Inspection: No masses, redness, or swelling Alignment: Symmetrical Functional ROM: Unrestricted ROM Stability: No instability detected Muscle Tone/Strength: Functionally intact. No obvious neuro-muscular anomalies detected. Sensory (Neurological): Unimpaired Muscle strength & Tone: No palpable anomalies  Lumbar Spine Area Exam  Skin & Axial Inspection: No masses, redness, or swelling Alignment: Symmetrical Functional ROM: Decreased ROM       Stability: No instability detected Muscle Tone/Strength: Functionally intact. No obvious neuro-muscular anomalies detected. Sensory (Neurological): Musculoskeletal pain pattern Palpation: No palpable anomalies       Provocative Tests: Hyperextension/rotation test: deferred today       Lumbar quadrant test (Kemp's test): deferred today       Lateral bending test: deferred today       Patrick's Maneuver: deferred today                   FABER test: deferred today                   S-I anterior distraction/compression test: deferred today         S-I lateral compression test: deferred today         S-I Thigh-thrust test: deferred today         S-I Gaenslen's test: deferred today          Gait & Posture Assessment  Ambulation: Unassisted Gait: Relatively normal for age and body habitus Posture: WNL   Lower Extremity Exam    Side: Right lower extremity  Side: Left lower extremity  Stability: No instability observed          Stability: No instability observed          Skin & Extremity Inspection: Skin color, temperature, and hair growth are WNL. No peripheral edema or cyanosis. No masses, redness, swelling, asymmetry, or associated skin lesions. No contractures.  Skin & Extremity Inspection: Skin color, temperature, and hair growth are WNL. No peripheral edema or cyanosis. No masses, redness, swelling, asymmetry, or associated skin lesions. No contractures.   Functional ROM: Unrestricted ROM                  Functional ROM: Decreased  ROM for hip and knee joints          Muscle Tone/Strength: Functionally intact. No obvious neuro-muscular anomalies detected.  Muscle Tone/Strength: Functionally intact. No obvious neuro-muscular anomalies detected.  Sensory (Neurological): Unimpaired  Sensory (Neurological): Arthropathic arthralgia  Palpation: No palpable anomalies  Palpation: No palpable anomalies   Assessment  Primary Diagnosis & Pertinent Problem List: The primary encounter diagnosis was Chronic pain syndrome. Diagnoses of Lumbar radiculopathy, Lumbar degenerative disc disease, History of left knee replacement, and Primary osteoarthritis of both knees were also pertinent to this visit.  Status Diagnosis  Controlled Controlled Controlled 1. Chronic pain syndrome   2. Lumbar radiculopathy   3. Lumbar degenerative disc disease   4. History of left knee replacement   5. Primary osteoarthritis of both knees      General Recommendations: The pain condition that the patient suffers from is best treated with a multidisciplinary approach that involves an increase in physical activity to prevent de-conditioning and worsening of the pain cycle, as well as psychological counseling (formal and/or informal) to address the co-morbid psychological affects of pain. Treatment will often involve judicious use of pain medications and interventional procedures to decrease the pain, allowing the patient to participate in the physical activity that will ultimately produce long-lasting pain reductions. The goal of the multidisciplinary approach is to return the patient to a higher level of overall function and to restore their ability to perform activities of daily living.  62 year old male with a history of chronic pain syndrome secondary lumbar radiculopathy, lumbar degenerative disc disease, lumbar foraminal stenosis as well as persistent left knee pain secondary to  left knee osteoarthritis status post left knee replacement.  Patient follows up today stating that he has lost his job given his low and ability to perform secondary to his pain.  Patient is distraught about this.   Patient is here for medication refill.  He states that oxycodone is more effective than Percocet.  We will do a cross rotation at equivalent dosing for oxycodone 10 mg twice daily to 3 times daily as needed, maximum quantity 75/month.  Future considerations:Lumbar facet medial branch nerve blocks for lumbar spondylosis as noted on most recent CT, left knee genicular nerve block, repeat lumbar ESI #3  Plan of Care  Pharmacotherapy (Medications Ordered): Meds ordered this encounter  Medications  . DISCONTD: Oxycodone HCl 10 MG TABS    Sig: Take 1 tablet (10 mg total) by mouth 3 (three) times daily as needed. Max 75 To fill on or after: 08-09-2018, 09-08-2018, 10-07-2018    Dispense:  75 tablet    Refill:  0    Do not place this medication, or any other prescription from our practice, on "Automatic Refill". Patient may have prescription filled one day early if pharmacy is closed on scheduled refill date. Do not fill until:  To last until:  . DISCONTD: Oxycodone HCl 10 MG TABS    Sig: Take 1 tablet (10 mg total) by mouth 3 (three) times daily as needed. Max 75 To fill on or after: 08-09-2018, 09-08-2018, 10-07-2018    Dispense:  75 tablet    Refill:  0    Do not place this medication, or any other prescription from our practice, on "Automatic Refill". Patient may have prescription filled one day early if pharmacy is closed on scheduled refill date. Do not fill until:  To last until:  . Oxycodone HCl 10 MG TABS    Sig: Take 1 tablet (10 mg total) by  mouth 3 (three) times daily as needed. Max 75 To fill on or after: 08-09-2018, 09-08-2018, 10-07-2018    Dispense:  75 tablet    Refill:  0    Do not place this medication, or any other prescription from our practice, on "Automatic  Refill". Patient may have prescription filled one day early if pharmacy is closed on scheduled refill date. Do not fill until:  To last until:   Lab-work, procedure(s), and/or referral(s): No orders of the defined types were placed in this encounter.   Pharmacological management options:  Opioid Analgesics: We'll take over management today. See above orders Membrane stabilizer: Tried and failed Muscle relaxant: Tried and failed NSAID: Options discussed, Mr. Wishon would prefer avoiding them Other analgesic(s): To be determined at a later time   Time Note: Greater than 50% of the 25 minute(s) of face-to-face time spent with Mr. Walling, was spent in counseling/coordination of care regarding: the appropriate use of the pain scale, opioid tolerance, "Drug Holidays", Mr. Carchi's primary cause of pain, medication side effects, the opioid analgesic risks and possible complications, realistic expectations, the goals of pain management (increased in functionality) and the medication agreement.  Provider-requested follow-up: Return in about 3 months (around 11/09/2018) for Medication Management.  Future Appointments  Date Time Provider [place]  11/08/2018  8:30 AM [name], MD [facility] None    Primary Care Physician: Mikey College, NP Location: [facility] Note by: [name], M.D Date: 08/09/2018; Time: 11:18 AM  Patient Instructions  You have been given 3 prescriptions for Oxycodone 82m to last until 11/07/2018.

## 2018-08-17 ENCOUNTER — Ambulatory Visit: Payer: Self-pay | Admitting: Nurse Practitioner

## 2018-08-22 ENCOUNTER — Telehealth: Payer: Self-pay

## 2018-08-22 NOTE — Telephone Encounter (Signed)
Pt called to speak with Dr Holley Raring or his Nurse about His Pain And ? Going on disability. Please call him back

## 2018-08-22 NOTE — Telephone Encounter (Signed)
Spoke with patient.  He just wants to know how to go about getting disability. Instructed patient to call his PCP for disability.

## 2018-09-23 ENCOUNTER — Ambulatory Visit: Payer: Self-pay | Admitting: Family Medicine

## 2018-09-26 ENCOUNTER — Encounter: Payer: Self-pay | Admitting: Nurse Practitioner

## 2018-09-26 ENCOUNTER — Ambulatory Visit (INDEPENDENT_AMBULATORY_CARE_PROVIDER_SITE_OTHER): Payer: Self-pay | Admitting: Nurse Practitioner

## 2018-09-26 ENCOUNTER — Other Ambulatory Visit: Payer: Self-pay

## 2018-09-26 VITALS — BP 149/77 | HR 75 | Temp 98.6°F | Ht 73.0 in | Wt 229.8 lb

## 2018-09-26 DIAGNOSIS — J069 Acute upper respiratory infection, unspecified: Secondary | ICD-10-CM

## 2018-09-26 DIAGNOSIS — M1990 Unspecified osteoarthritis, unspecified site: Secondary | ICD-10-CM

## 2018-09-26 MED ORDER — MELOXICAM 15 MG PO TABS: ORAL_TABLET | ORAL | 2 refills | Status: AC

## 2018-09-26 NOTE — Patient Instructions (Addendum)
Anthony Nguyen,   Thank you for coming in to clinic today.  1. Continue regular movement.   2. You have osteoarthritis several joints, and may have bursitis in shoulders.  - Start taking Tylenol extra strength 1 to 2 tablets every 6-8 hours for aches or fever/chills for next few days as needed.  Do not take more than 3,000 mg in 24 hours from all medicines.  - START meloxicam 15mg  one tablet daily for 14 days.  Then take 1/2 - 1 tablet daily prn for arthritis pain.  DO NOT take ibuprofen or Aleve - Use heat and ice.  Apply this for 15 minutes at a time 6-8 times per day.   - Muscle rub with lidocaine, lidocaine patch, Biofreeze, or tiger balm for topical pain relief.  Avoid using this with heat and ice to avoid burns.   3. Upper respiratory symptoms: - Likely is viral or allergy cause.  - START cetirizine (Zyrtec) 10 mg once daily - START Flonase or Nasocort  Please schedule a follow-up appointment with Cassell Smiles, AGNP. Return if symptoms worsen or fail to improve.  If you have any other questions or concerns, please feel free to call the clinic or send a message through [place]. You may also schedule an earlier appointment if necessary.  You will receive a survey after today's visit either digitally by e-mail or paper by [employer]. Your experiences and feedback matter to [place].  Please respond so we know how we are doing as we provide care for you.   Cassell Smiles, DNP, AGNP-BC Adult Gerontology Nurse Practitioner [place]

## 2018-09-26 NOTE — Progress Notes (Signed)
Subjective:    Patient ID: Anthony Nguyen, male    DOB: [birth year], 62 y.o.   MRN: [id number]  Anthony Nguyen is a 62 y.o. male presenting on 09/26/2018 for Joint Pain (bilateral leg, arm pain. The pt fell out a truck x 2-3 weeks ago and unsure if the fall cold have something to do with it. ) and Cough (productive cough x 3 days )   HPI Joint pain About 2-3 weeks ago he fell out of a truck.  Landed on his buttocks and left hip/side.  He is having ongoing chronic bilateral leg, arm (shoulder and elbow) pain.  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  He is currently out of his oxycodone prescribed by pain management and is not experiencing any withdrawal except headache.  He is allowed 10 mg tid prn with #75 tabs and routinely takes tid and runs out prior to next refill/appt.  Cough Patient describes symptoms as mild and are only associated with increased nasal congestion, cough for last 3 days.  He has not fatigue, malaise, sore throat, ear pain, sinus pressure.  He has known allergies and is not taking anything to manage symptoms currently. 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  Social History   Tobacco Use  . Smoking status: Current Every Day Smoker    Packs/day: 1.00    Years: 30.00    Pack years: 30.00    Types: Cigarettes  . Smokeless tobacco: Never Used  Substance Use Topics  . Alcohol use: No    Frequency: Never  . Drug use: No    Review of Systems Per HPI unless specifically indicated above     Objective:    BP (!) 149/77 (BP Location: Right Arm, Patient Position: Sitting, Cuff Size: Normal)   Pulse 75   Temp 98.6 F (37 C) (Oral)   Ht 6\' 1"  (1.854 m)   Wt 229 lb 12.8 oz (104.2 kg)   BMI 30.32 kg/m   Wt Readings from Last 3 Encounters:  09/26/18 229 lb 12.8 oz (104.2 kg)  08/09/18 225 lb (102.1 kg)  05/10/18  215 lb (97.5 kg)    Physical Exam  Constitutional: He is oriented to person, place, and time. Vital signs are normal. He appears well-developed and well-nourished. No distress.  HENT:  Head: Normocephalic and atraumatic.  Right Ear: Hearing, tympanic membrane, external ear and ear canal normal.  Left Ear: Hearing, tympanic membrane, external ear and ear canal normal.  Nose: Mucosal edema and rhinorrhea present. Right sinus exhibits no maxillary sinus tenderness and no frontal sinus tenderness. Left sinus exhibits no maxillary sinus tenderness and no frontal sinus tenderness.  Mouth/Throat: Uvula is midline and mucous membranes are normal. Posterior oropharyngeal edema (cobblestoning) and posterior oropharyngeal erythema present. No oropharyngeal exudate or tonsillar abscesses. Tonsils are 0 on the right. Tonsils are 0 on the left.  Cardiovascular: Normal rate, regular rhythm, S1 normal, S2 normal, normal heart sounds and intact distal pulses.  Pulmonary/Chest: Effort normal and breath sounds normal. No respiratory distress.  Musculoskeletal:       Right shoulder: He exhibits tenderness (mild lateral shoulder tenderness) and pain. He exhibits normal range of motion (normal PROM abduction, adduction, internal/external rotation, flex/extension), no bony tenderness, no swelling, no effusion, no crepitus, no deformity, no laceration, no spasm, normal pulse and normal strength.       Left  shoulder: He exhibits tenderness (mild lateral shoulder tenderness), effusion (small effusion noted, cannot exclude bursitis) and pain (abduction). He exhibits normal range of motion (normal PROM abduction, adduction, internal/external rotation, flex/extension), no bony tenderness, no swelling, no crepitus, no deformity, no laceration, no spasm, normal pulse and normal strength.       Right elbow: Normal.      Left elbow: Normal.       Right hip: He exhibits decreased range of motion and tenderness. He exhibits no bony  tenderness, no swelling, no crepitus, no deformity and no laceration.       Left hip: Normal.       Right knee: Normal.       Left knee: Normal.  Lymphadenopathy:       Head (right side): No submental, no submandibular, no tonsillar, no preauricular and no posterior auricular adenopathy present.       Head (left side): No submental, no submandibular, no tonsillar, no preauricular and no posterior auricular adenopathy present.    He has no cervical adenopathy.  Neurological: He is alert and oriented to person, place, and time.  Skin: Skin is warm and dry. Capillary refill takes less than 2 seconds.  Psychiatric: He has a normal mood and affect. His behavior is normal. Judgment and thought content normal.  Vitals reviewed.   Results for orders placed or performed during the hospital encounter of 04/05/18  Lipase, blood  Result Value Ref Range   Lipase 59 (H) 11 - 51 U/L  Comprehensive metabolic panel  Result Value Ref Range   Sodium 142 135 - 145 mmol/L   Potassium 3.3 (L) 3.5 - 5.1 mmol/L   Chloride 109 101 - 111 mmol/L   CO2 23 22 - 32 mmol/L   Glucose, Bld 148 (H) 65 - 99 mg/dL   BUN 20 6 - 20 mg/dL   Creatinine, Ser 1.07 0.61 - 1.24 mg/dL   Calcium 9.4 8.9 - 10.3 mg/dL   Total Protein 7.3 6.5 - 8.1 g/dL   Albumin 4.7 3.5 - 5.0 g/dL   AST 26 15 - 41 U/L   ALT 19 17 - 63 U/L   Alkaline Phosphatase 66 38 - 126 U/L   Total Bilirubin 0.8 0.3 - 1.2 mg/dL   GFR calc non Af Amer >60 >60 mL/min   GFR calc Af Amer >60 >60 mL/min   Anion gap 10 5 - 15  CBC  Result Value Ref Range   WBC 9.7 3.8 - 10.6 K/uL   RBC 4.50 4.40 - 5.90 MIL/uL   Hemoglobin 13.6 13.0 - 18.0 g/dL   HCT 39.9 (L) 40.0 - 52.0 %   MCV 88.7 80.0 - 100.0 fL   MCH 30.3 26.0 - 34.0 pg   MCHC 34.1 32.0 - 36.0 g/dL   RDW 13.8 11.5 - 14.5 %   Platelets 183 150 - 440 K/uL  Urinalysis, Complete w Microscopic  Result Value Ref Range   Color, Urine YELLOW (A) YELLOW   APPearance CLEAR (A) CLEAR   Specific Gravity,  Urine 1.031 (H) 1.005 - 1.030   pH 5.0 5.0 - 8.0   Glucose, UA NEGATIVE NEGATIVE mg/dL   Hgb urine dipstick NEGATIVE NEGATIVE   Bilirubin Urine NEGATIVE NEGATIVE   Ketones, ur NEGATIVE NEGATIVE mg/dL   Protein, ur 30 (A) NEGATIVE mg/dL   Nitrite NEGATIVE NEGATIVE   Leukocytes, UA NEGATIVE NEGATIVE   RBC / HPF 0-5 0 - 5 RBC/hpf   WBC, UA 0-5 0 - 5 WBC/hpf  Bacteria, UA NONE SEEN NONE SEEN   Squamous Epithelial / LPF NONE SEEN 0 - 5   Mucus PRESENT       Assessment & Plan:   Problem List Items Addressed This Visit    None    Visit Diagnoses    Arthritis    -  Primary Pain likely chronic.  Increased with change in medications after becoming uninsured.  Complicated by likely osteoarthritis vs bursitis of shoulders.  Plan:  1. Treat with OTC pain meds (acetaminophen) and meloxicam.  Discussed alternate dosing and max dosing. - START meloxicam 15 mg daily for 2 weeks.  Then reduce to 7.5-15 mg daily prn. 2. Apply heat and/or ice to affected area. 3. May also apply a muscle rub with lidocaine or lidocaine patch after heat or ice. 4. Continue follow-up with pain management for lumbar ddd. 5. Follow up prn     Relevant Medications   meloxicam (MOBIC) 15 MG tablet   Viral URI     Acute illness. Fever responsive to NSAIDs and tylenol.  Symptoms not worsening. Consistent with viral illness x 3 days with no known sick contacts and no identifiable focal infections of ears, nose, throat.  Plan: 1. Reassurance, likely self-limited with cough lasting up to few weeks - Restart anti-histamine cetirizine 10mg  daily,  - also can use Flonase 2 sprays each nostril daily for up to 4-6 weeks - Start Mucinex-DM OTC up to 7-10 days then stop 2. Supportive care with nasal saline, warm herbal tea with honey, 3. Improve hydration 4. Tylenol / Motrin PRN fevers 5. Return criteria given       Meds ordered this encounter  Medications  . meloxicam (MOBIC) 15 MG tablet    Sig: Take 1 tablet (15 mg  total) by mouth daily for 14 days, THEN 0.5-1 tablets (7.5-15 mg total) daily as needed for up to 26 days (joint pain).    Dispense:  30 tablet    Refill:  2    Order Specific Question:   Supervising Provider    Answer:   Olin Hauser [[id number]]    Follow up plan: Return if symptoms worsen or fail to improve.  Cassell Smiles, DNP, AGPCNP-BC Adult Gerontology Primary Care Nurse Practitioner [facility] 09/26/2018, 4:12 PM

## 2018-09-28 ENCOUNTER — Telehealth: Payer: Self-pay | Admitting: Student in an Organized Health Care Education/Training Program

## 2018-09-28 NOTE — Telephone Encounter (Signed)
Patient lvmail this am at 7:57 stating he would like to speak with physician. No other information left.

## 2018-09-29 ENCOUNTER — Telehealth: Payer: Self-pay | Admitting: *Deleted

## 2018-11-08 ENCOUNTER — Other Ambulatory Visit: Payer: Self-pay

## 2018-11-08 ENCOUNTER — Encounter: Payer: Self-pay | Admitting: Student in an Organized Health Care Education/Training Program

## 2018-11-08 ENCOUNTER — Ambulatory Visit
Payer: Self-pay | Attending: Student in an Organized Health Care Education/Training Program | Admitting: Student in an Organized Health Care Education/Training Program

## 2018-11-08 VITALS — BP 159/91 | HR 90 | Temp 98.3°F | Resp 18 | Ht 73.0 in | Wt 232.0 lb

## 2018-11-08 DIAGNOSIS — Z96652 Presence of left artificial knee joint: Secondary | ICD-10-CM | POA: Insufficient documentation

## 2018-11-08 DIAGNOSIS — M48061 Spinal stenosis, lumbar region without neurogenic claudication: Secondary | ICD-10-CM | POA: Insufficient documentation

## 2018-11-08 DIAGNOSIS — M170 Bilateral primary osteoarthritis of knee: Secondary | ICD-10-CM | POA: Insufficient documentation

## 2018-11-08 DIAGNOSIS — M5136 Other intervertebral disc degeneration, lumbar region: Secondary | ICD-10-CM | POA: Insufficient documentation

## 2018-11-08 DIAGNOSIS — M5416 Radiculopathy, lumbar region: Secondary | ICD-10-CM | POA: Insufficient documentation

## 2018-11-08 DIAGNOSIS — G894 Chronic pain syndrome: Secondary | ICD-10-CM | POA: Insufficient documentation

## 2018-11-08 MED ORDER — OXYCODONE HCL 10 MG PO TABS: 10.0000 mg | ORAL_TABLET | Freq: Three times a day (TID) | ORAL | 0 refills | Status: DC | PRN

## 2018-11-08 NOTE — Progress Notes (Signed)
Nursing Pain Medication Assessment:  Safety precautions to be maintained throughout the outpatient stay will include: orient to surroundings, keep bed in low position, maintain call bell within reach at all times, provide assistance with transfer out of bed and ambulation.  Medication Inspection Compliance: Pill count conducted under aseptic conditions, in front of the patient. Neither the pills nor the bottle was removed from the patient's sight at any time. Once count was completed pills were immediately returned to the patient in their original bottle.  Medication: Oxycodone IR Pill/Patch Count: 0 of 75 pills remain Pill/Patch Appearance: Markings consistent with prescribed medication Bottle Appearance: Standard pharmacy container. Clearly labeled. Filled Date: 35 / 20 / 2019 Last Medication intake:  "2 weeks ago"

## 2018-11-08 NOTE — Progress Notes (Signed)
Patient's Name: Anthony Nguyen  MRN: [id number]  Referring Provider: Mikey College, *  DOB: [birth year]  PCP: Mikey College, NP  DOS: 11/08/2018  Note by: [name], MD  Service setting: Ambulatory outpatient  Specialty: Interventional Pain Management  Location: [facility] (AMB) Pain Management Facility    Patient type: Established   Primary Reason(s) for Visit: Encounter for prescription drug management. (Level of risk: moderate)  CC: Back Pain (lower) and Knee Pain (left)  HPI  Anthony Nguyen is a 63 y.o. year old, male patient, who comes today for a medication management evaluation. He has Sleep disturbance; Allergy; GERD (gastroesophageal reflux disease); Tobacco abuse counseling; Ringworm; Chronic nasal congestion; Benign paroxysmal positional vertigo; Primary osteoarthritis of left knee; Lumbar radiculopathy; Lumbar degenerative disc disease; Lumbar foraminal stenosis; History of left knee replacement; Primary osteoarthritis of both knees; Chronic pain syndrome; and Benign prostatic hyperplasia with urinary frequency on their problem list. His primarily concern today is the Back Pain (lower) and Knee Pain (left)  Pain Assessment: Location: Lower, Right, Left Back Radiating: to right buttock, right thigh Onset: More than a month ago Duration: Chronic pain Quality: Constant, Sharp("pulling feeling") Severity: 9 /10 (subjective, self-reported pain score)  Note: Reported level is inconsistent with clinical observations.                         When using our objective Pain Scale, levels between 6 and 10/10 are said to belong in an emergency room, as it progressively worsens from a 6/10, described as severely limiting, requiring emergency care not usually available at an outpatient pain management facility. At a 6/10 level, communication becomes difficult and requires great effort. Assistance to reach the emergency department may be required. Facial flushing and profuse sweating  along with potentially dangerous increases in heart rate and blood pressure will be evident. Effect on ADL:   Timing: Constant Modifying factors: meds BP: (!) 159/91  HR: 90  Anthony Nguyen was last scheduled for an appointment on 09/28/2018 for medication management. During today's appointment we reviewed Anthony Nguyen's chronic pain status, as well as his outpatient medication regimen.  Endorsing worsening pain in his left knee towards the posterior aspect.  Also endorsing worsening low back pain.  Patient did sustain a fall approximately 2 weeks ago.  He is also having a difficult financial situation.  He had to file for bankruptcy.  He lost his previous job.  He does not currently have insurance.  He wants to hold off on the genicular nerve block that we discussed at the last visit.  The patient  reports no history of drug use. His body mass index is 30.61 kg/m.  Further details on both, my assessment(s), as well as the proposed treatment plan, please see below.  Controlled Substance Pharmacotherapy Assessment REMS (Risk Evaluation and Mitigation Strategy)  Analgesic: Oxycodone 10 mg twice daily to 3 times daily as needed, max quantity 75/month MME/day: 40-45 mg/day.  Rise Patience, RN  11/08/2018  8:29 AM  Signed Nursing Pain Medication Assessment:  Safety precautions to be maintained throughout the outpatient stay will include: orient to surroundings, keep bed in low position, maintain call bell within reach at all times, provide assistance with transfer out of bed and ambulation.  Medication Inspection Compliance: Pill count conducted under aseptic conditions, in front of the patient. Neither the pills nor the bottle was removed from the patient's sight at any time. Once count was completed pills were immediately returned to the  patient in their original bottle.  Medication: Oxycodone IR Pill/Patch Count: 0 of 75 pills remain Pill/Patch Appearance: Markings consistent with  prescribed medication Bottle Appearance: Standard pharmacy container. Clearly labeled. Filled Date: 6 / 20 / 2019 Last Medication intake:  "2 weeks ago"   Pharmacokinetics: Liberation and absorption (onset of action): WNL Distribution (time to peak effect): WNL Metabolism and excretion (duration of action): WNL         Pharmacodynamics: Desired effects: Analgesia: Anthony Nguyen reports >50% benefit. Functional ability: Patient reports that medication allows him to accomplish basic ADLs Clinically meaningful improvement in function (CMIF): Sustained CMIF goals met Perceived effectiveness: Described as relatively effective, allowing for increase in activities of daily living (ADL) Undesirable effects: Side-effects or Adverse reactions: None reported Monitoring: [place] PMP: Online review of the past 7-monthperiod conducted. Compliant with practice rules and regulations Last UDS on record: Summary  Date Value Ref Range Status  01/20/2018 FINAL  Final    Comment:    ==================================================================== TOXASSURE SELECT 13 (MW) ==================================================================== Test                             Result       Flag       Units Drug Present and Declared for Prescription Verification   Oxycodone                      525          EXPECTED   ng/mg creat   Oxymorphone                    331          EXPECTED   ng/mg creat   Noroxycodone                   2011         EXPECTED   ng/mg creat   Noroxymorphone                 174          EXPECTED   ng/mg creat    Sources of oxycodone are scheduled prescription medications.    Oxymorphone, noroxycodone, and noroxymorphone are expected    metabolites of oxycodone. Oxymorphone is also available as a    scheduled prescription medication. ==================================================================== Test                      Result    Flag   Units      Ref Range   Creatinine               159              mg/dL      >=20 ==================================================================== Declared Medications:  The flagging and interpretation on this report are based on the  following declared medications.  Unexpected results may arise from  inaccuracies in the declared medications.  **Note: The testing scope of this panel includes these medications:  Oxycodone (Percocet)  **Note: The testing scope of this panel does not include following  reported medications:  Acetaminophen (Percocet)  Aspirin (Aspirin 81)  Diclofenac  Escitalopram (Lexapro)  Fluticasone (Flonase)  Melatonin ==================================================================== For clinical consultation, please call [phone] ====================================================================    UDS interpretation: Compliant          Medication Assessment Form: Reviewed. Patient indicates being compliant with therapy Treatment compliance: Compliant Risk  Assessment Profile: Aberrant behavior: See prior evaluations. None observed or detected today Comorbid factors increasing risk of overdose: See prior notes. No additional risks detected today Opioid risk tool (ORT) (Total Score): 0 Personal History of Substance Abuse (SUD-Substance use disorder):  Alcohol: Negative  Illegal Drugs: Negative  Rx Drugs: Negative  ORT Risk Level calculation: Low Risk Risk of substance use disorder (SUD): Low Opioid Risk Tool - 11/08/18 0827      Family History of Substance Abuse   Alcohol  Negative    Illegal Drugs  Negative    Rx Drugs  Negative      Personal History of Substance Abuse   Alcohol  Negative    Illegal Drugs  Negative    Rx Drugs  Negative      Age   Age between 71-45 years   No      History of Preadolescent Sexual Abuse   History of Preadolescent Sexual Abuse  Negative or Male      Psychological Disease   Psychological Disease  Negative    Depression  Negative       Total Score   Opioid Risk Tool Scoring  0    Opioid Risk Interpretation  Low Risk      ORT Scoring interpretation table:  Score <3 = Low Risk for SUD  Score between 4-7 = Moderate Risk for SUD  Score >8 = High Risk for Opioid Abuse   Risk Mitigation Strategies:  Patient Counseling: Covered Patient-Prescriber Agreement (PPA): Present and active  Notification to other healthcare providers: Done  Pharmacologic Plan: No change in therapy, at this time.             Laboratory Chemistry  Inflammation Markers (CRP: Acute Phase) (ESR: Chronic Phase) Lab Results  Component Value Date   ESRSEDRATE 30 (H) 09/29/2017                         Rheumatology Markers No results found for: RF, ANA, LABURIC, URICUR, LYMEIGGIGMAB, LYMEABIGMQN, HLAB27                      Renal Function Markers Lab Results  Component Value Date   BUN 20 04/05/2018   CREATININE 1.07 04/05/2018   GFRAA >60 04/05/2018   GFRNONAA >60 04/05/2018                             Hepatic Function Markers Lab Results  Component Value Date   AST 26 04/05/2018   ALT 19 04/05/2018   ALBUMIN 4.7 04/05/2018   ALKPHOS 66 04/05/2018   LIPASE 59 (H) 04/05/2018                        Electrolytes Lab Results  Component Value Date   NA 142 04/05/2018   K 3.3 (L) 04/05/2018   CL 109 04/05/2018   CALCIUM 9.4 04/05/2018                        Neuropathy Markers No results found for: VITAMINB12, FOLATE, HGBA1C, HIV                      CNS Tests No results found for: COLORCSF, APPEARCSF, RBCCOUNTCSF, WBCCSF, POLYSCSF, LYMPHSCSF, EOSCSF, PROTEINCSF, GLUCCSF, JCVIRUS, CSFOLI, IGGCSF  Bone Pathology Markers No results found for: Marveen Reeks, [id number], [id number], 25OHVITD1, 25OHVITD2, 25OHVITD3, TESTOFREE, TESTOSTERONE                       Coagulation Parameters Lab Results  Component Value Date   INR 0.96 09/29/2017   LABPROT 12.7 09/29/2017   APTT 31 09/29/2017   PLT 183 04/05/2018                         Cardiovascular Markers Lab Results  Component Value Date   TROPONINI <0.03 11/16/2014   HGB 13.6 04/05/2018   HCT 39.9 (L) 04/05/2018                         CA Markers No results found for: CEA, CA125, LABCA2                      Note: Lab results reviewed.  Recent Diagnostic Imaging Results  CT ABDOMEN PELVIS W CONTRAST CLINICAL DATA:  Lower abdominal pain radiating to the low back for 4 days.  EXAM: CT ABDOMEN AND PELVIS WITH CONTRAST  TECHNIQUE: Multidetector CT imaging of the abdomen and pelvis was performed using the standard protocol following bolus administration of intravenous contrast.  CONTRAST:  129m ISOVUE-370 IOPAMIDOL (ISOVUE-370) INJECTION 76%  COMPARISON:  11/07/2014  FINDINGS: Lower chest: Dependent changes in the lung bases. Coronary artery calcifications.  Hepatobiliary: Mild diffuse fatty infiltration of the liver. No focal liver lesions. Gallbladder and bile ducts are unremarkable.  Pancreas: Unremarkable. No pancreatic ductal dilatation or surrounding inflammatory changes.  Spleen: Normal in size without focal abnormality.  Adrenals/Urinary Tract: Adrenal glands are unremarkable. Kidneys are normal, without renal calculi, focal lesion, or hydronephrosis. Bladder wall is not thickened. No filling defects. Minimal left posterior bladder diverticulum.  Stomach/Bowel: Stomach is within normal limits. Appendix appears normal. No evidence of bowel wall thickening, distention, or inflammatory changes.  Vascular/Lymphatic: Aortic atherosclerosis. No enlarged abdominal or pelvic lymph nodes.  Reproductive: Prostate gland is enlarged, measuring 4.7 cm.  Other: No free air or free fluid in the abdomen. Minimal periumbilical hernia containing fat.  Musculoskeletal: Spondylolysis with minimal spondylolisthesis at L5-S1. Healing fractures of the right second and third lumbar transverse processes. Old bilateral rib  fractures.  IMPRESSION: 1. Mild fatty infiltration of the liver. 2. Small bladder diverticulum. No bladder wall thickening or filling defect. 3. Aortic atherosclerosis. 4. Enlarged prostate gland. 5. 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  Electronically Signed   By: WLucienne CapersM.D.   On: 04/06/2018 01:52  Complexity Note: Imaging results reviewed. Results shared with Anthony Nguyen, using Layman's terms.                         Meds   Current Outpatient Medications:  .  Diclofenac Sodium 3 % GEL, Place 1 application onto the skin 3 (three) times daily as needed (left knee pain)., Disp: 2 Tube, Rfl: 2 .  Oxycodone HCl 10 MG TABS, Take 1 tablet (10 mg total) by mouth 3 (three) times daily as needed. Max 75, Disp: 75 tablet, Rfl: 0 .  Melatonin 1 MG TABS, Take 1 tablet by mouth at bedtime as needed., Disp: , Rfl:   ROS  Constitutional: Denies any fever or chills Gastrointestinal: No reported hemesis, hematochezia, vomiting, or acute GI distress Musculoskeletal: Denies any acute onset joint swelling, redness, loss of ROM, or weakness  Neurological: No reported episodes of acute onset apraxia, aphasia, dysarthria, agnosia, amnesia, paralysis, loss of coordination, or loss of consciousness  Allergies  Anthony Nguyen has No Known Allergies.  PFSH  Drug: Anthony Nguyen  reports no history of drug use. Alcohol:  reports no history of alcohol use. Tobacco:  reports that he has been smoking cigarettes. He has a 30.00 pack-year smoking history. He has never used smokeless tobacco. Medical:  has a past medical history of Allergy, Arthritis, Cancer ([place]), Chronic back pain, Current every day smoker, Diverticulosis, GERD (gastroesophageal reflux disease), H/O diverticulitis of colon, Restless leg syndrome, and Sciatic pain. Surgical: Anthony Nguyen  has a past surgical history that includes Tonsillectomy and adenoidectomy; Total knee arthroplasty (Left, 10/14/2017); skin cancer  removal; and Joint replacement (Left, 10/14/2018). Family: family history includes Alzheimer's disease in his father; Cancer (age of onset: 29) in his sister; Diabetes in his sister; Healthy in his brother, son, son, son, and son; Heart disease in his brother; Hypertension in his mother; Prostate cancer (age of onset: 76) in his father.  Constitutional Exam  General appearance: Well nourished, well developed, and well hydrated. In no apparent acute distress Vitals:   11/08/18 0820  BP: (!) 159/91  Pulse: 90  Resp: 18  Temp: 98.3 F (36.8 C)  TempSrc: Oral  SpO2: 99%  Weight: 232 lb (105.2 kg)  Height: 6' 1"  (1.854 m)   BMI Assessment: Estimated body mass index is 30.61 kg/m as calculated from the following:   Height as of this encounter: 6' 1"  (1.854 m).   Weight as of this encounter: 232 lb (105.2 kg).  BMI interpretation table: BMI level Category Range association with higher incidence of chronic pain  <18 kg/m2 Underweight   18.5-24.9 kg/m2 Ideal body weight   25-29.9 kg/m2 Overweight Increased incidence by 20%  30-34.9 kg/m2 Obese (Class I) Increased incidence by 68%  35-39.9 kg/m2 Severe obesity (Class II) Increased incidence by 136%  >40 kg/m2 Extreme obesity (Class III) Increased incidence by 254%   Patient's current BMI Ideal Body weight  Body mass index is 30.61 kg/m. Ideal body weight: 79.9 kg (176 lb 2.4 oz) Adjusted ideal body weight: 90 kg (198 lb 7.8 oz)   BMI Readings from Last 4 Encounters:  11/08/18 30.61 kg/m  09/26/18 30.32 kg/m  08/09/18 29.69 kg/m  05/10/18 28.37 kg/m   Wt Readings from Last 4 Encounters:  11/08/18 232 lb (105.2 kg)  09/26/18 229 lb 12.8 oz (104.2 kg)  08/09/18 225 lb (102.1 kg)  05/10/18 215 lb (97.5 kg)  Psych/Mental status: Alert, oriented x 3 (person, place, & time)       Eyes: PERLA Respiratory: No evidence of acute respiratory distress   Thoracic Spine Area Exam  Skin & Axial Inspection: No masses, redness, or  swelling Alignment: Symmetrical Functional ROM: Decreased ROM Stability: No instability detected Muscle Tone/Strength: Functionally intact. No obvious neuro-muscular anomalies detected. Sensory (Neurological): Musculoskeletal pain pattern Muscle strength & Tone: No palpable anomalies  Lumbar Spine Area Exam  Skin & Axial Inspection: No masses, redness, or swelling Alignment: Symmetrical Functional ROM: Decreased ROM       Stability: No instability detected Muscle Tone/Strength: Functionally intact. No obvious neuro-muscular anomalies detected. Sensory (Neurological): Musculoskeletal pain pattern Palpation: No palpable anomalies       Provocative Tests: Hyperextension/rotation test: (+) due to pain. Lumbar quadrant test (Kemp's test): (+) due to pain. Lateral bending test: deferred today       Patrick's Maneuver: deferred today  FABER* test: deferred today                   S-I anterior distraction/compression test: deferred today         S-I lateral compression test: deferred today         S-I Thigh-thrust test: deferred today         S-I Gaenslen's test: deferred today         *(Flexion, ABduction and External Rotation)  Gait & Posture Assessment  Ambulation: Unassisted Gait: Relatively normal for age and body habitus Posture: WNL   Lower Extremity Exam    Side: Right lower extremity  Side: Left lower extremity  Stability: No instability observed          Stability: No instability observed          Skin & Extremity Inspection: Skin color, temperature, and hair growth are WNL. No peripheral edema or cyanosis. No masses, redness, swelling, asymmetry, or associated skin lesions. No contractures.  Skin & Extremity Inspection: Evidence of prior arthroplastic surgery  Functional ROM: Unrestricted ROM                  Functional ROM: Decreased ROM for hip and knee joints          Muscle Tone/Strength: Functionally intact. No obvious neuro-muscular anomalies  detected.  Muscle Tone/Strength: Functionally intact. No obvious neuro-muscular anomalies detected.  Sensory (Neurological): Unimpaired        Sensory (Neurological): Arthropathic arthralgia        DTR: Patellar: deferred today Achilles: deferred today Plantar: deferred today  DTR: Patellar: 1+: trace Achilles: deferred today Plantar: deferred today  Palpation: No palpable anomalies  Palpation: No palpable anomalies   Assessment  Primary Diagnosis & Pertinent Problem List: The primary encounter diagnosis was Chronic pain syndrome. Diagnoses of Lumbar radiculopathy, Lumbar degenerative disc disease, History of left knee replacement, Primary osteoarthritis of both knees, and Lumbar foraminal stenosis were also pertinent to this visit.  Status Diagnosis  Controlled Controlled Controlled 1. Chronic pain syndrome   2. Lumbar radiculopathy   3. Lumbar degenerative disc disease   4. History of left knee replacement   5. Primary osteoarthritis of both knees   6. Lumbar foraminal stenosis      General Recommendations: The pain condition that the patient suffers from is best treated with a multidisciplinary approach that involves an increase in physical activity to prevent de-conditioning and worsening of the pain cycle, as well as psychological counseling (formal and/or informal) to address the co-morbid psychological affects of pain. Treatment will often involve judicious use of pain medications and interventional procedures to decrease the pain, allowing the patient to participate in the physical activity that will ultimately produce long-lasting pain reductions. The goal of the multidisciplinary approach is to return the patient to a higher level of overall function and to restore their ability to perform activities of daily living.  63 year old male with a history of chronic pain syndrome secondary lumbar radiculopathy, lumbar degenerative disc disease, lumbar foraminal stenosis as well as  persistent left knee pain secondary to left knee osteoarthritis status post left knee replacement.   Endorsing worsening pain in his left knee towards the posterior aspect.  Also endorsing worsening low back pain.  Patient did sustain a fall approximately 2 weeks ago.  He is also having a difficult financial situation.  He had to file for bankruptcy.  He lost his previous job.  He does not currently have insurance.  He wants  to hold off on the genicular nerve block that we discussed at the last visit.  Refill of oxycodone as below for 1 month.  Future considerations:Lumbar facet medial branch nerve blocks for lumbar spondylosis as noted on most recent CT, left knee genicular nerve block, repeat lumbar ESI #3   Plan of Care  Pharmacotherapy (Medications Ordered): Meds ordered this encounter  Medications  . Oxycodone HCl 10 MG TABS    Sig: Take 1 tablet (10 mg total) by mouth 3 (three) times daily as needed. Max 75    Dispense:  75 tablet    Refill:  0    Do not place this medication, or any other prescription from our practice, on "Automatic Refill". Patient may have prescription filled one day early if pharmacy is closed on scheduled refill date.   Provider-requested follow-up: Return in about 4 weeks (around 12/06/2018) for Medication Management.   Primary Care Physician: Mikey College, NP Location: [facility] Note by: [name], M.D Date: 11/08/2018; Time: 8:49 AM  There are no Patient Instructions on file for this visit.

## 2018-11-25 ENCOUNTER — Ambulatory Visit (INDEPENDENT_AMBULATORY_CARE_PROVIDER_SITE_OTHER): Payer: No Typology Code available for payment source | Admitting: Nurse Practitioner

## 2018-11-25 VITALS — BP 152/77 | HR 82 | Temp 98.3°F | Ht 73.0 in | Wt 227.2 lb

## 2018-11-25 DIAGNOSIS — I10 Essential (primary) hypertension: Secondary | ICD-10-CM | POA: Diagnosis not present

## 2018-11-25 DIAGNOSIS — Z23 Encounter for immunization: Secondary | ICD-10-CM

## 2018-11-25 DIAGNOSIS — M5410 Radiculopathy, site unspecified: Secondary | ICD-10-CM

## 2018-11-25 DIAGNOSIS — G4701 Insomnia due to medical condition: Secondary | ICD-10-CM

## 2018-11-28 ENCOUNTER — Encounter: Payer: Self-pay | Admitting: Nurse Practitioner

## 2018-11-28 DIAGNOSIS — I10 Essential (primary) hypertension: Secondary | ICD-10-CM | POA: Insufficient documentation

## 2018-11-28 MED ORDER — LISINOPRIL 10 MG PO TABS: 10.0000 mg | ORAL_TABLET | Freq: Every day | ORAL | 1 refills | Status: DC

## 2018-11-28 NOTE — Progress Notes (Signed)
Subjective:    Patient ID: Anthony Nguyen, male    DOB: 1956/06/20, 63 y.o.   MRN: [id number]  Anthony Nguyen is a 63 y.o. male presenting on 11/25/2018 for Numbness (bilateral feet ); Back Pain (associated with burning/shooting pain lower abdomen); and Insomnia (1-2 hrs sleep per night x 2 months)   HPI Back Pain Patient has chronic back pain with radicular symptoms.  Currently, radiculopathy is manifested with bilateral numbness and tingling in feet and shooting/burning pain in lower abdomen.  Foot pain worsens with increased walking and becomes unbearable by the end of the day.  After 12 hours of walking during his day, he feels like he is walking "on bones."  Insomnia Patient reports insomnia over the last 2 months is now his primary problem.  However, patient attributes some insomnia to pain as he cannot get comfortable to fall asleep and wakes up in pain.  He gets 1 to 2 hours of sleep per night and periods of 1 hour.  Occasionally, he will sleep more than that as he feels he is "catching up."  Hypertension Patient reports not checking blood pressure at home.  He is not currently taking medications. Current symptoms include headaches on days he is also been more pain. - Pt denies lightheadedness, dizziness, changes in vision, chest tightness/pressure, palpitations, leg swelling, sudden loss of speech or loss of consciousness.   Social History   Tobacco Use  . Smoking status: Current Every Day Smoker    Packs/day: 1.00    Years: 30.00    Pack years: 30.00    Types: Cigarettes  . Smokeless tobacco: Never Used  Substance Use Topics  . Alcohol use: No    Frequency: Never  . Drug use: No    Review of Systems Per HPI unless specifically indicated above     Objective:    BP (!) 152/77   Pulse 82   Temp 98.3 F (36.8 C) (Oral)   Ht 6\' 1"  (1.854 m)   Wt 227 lb 3.2 oz (103.1 kg)   BMI 29.98 kg/m   Wt Readings from Last 3 Encounters:  11/25/18 227 lb 3.2 oz (103.1  kg)  11/08/18 232 lb (105.2 kg)  09/26/18 229 lb 12.8 oz (104.2 kg)    Physical Exam Vitals signs reviewed.  Constitutional:      General: He is not in acute distress.    Appearance: He is well-developed.  HENT:     Head: Normocephalic and atraumatic.  Cardiovascular:     Rate and Rhythm: Normal rate and regular rhythm.     Pulses:          Radial pulses are 2+ on the right side and 2+ on the left side.       Posterior tibial pulses are 1+ on the right side and 1+ on the left side.     Heart sounds: Normal heart sounds, S1 normal and S2 normal.  Pulmonary:     Effort: Pulmonary effort is normal. No respiratory distress.     Breath sounds: Normal breath sounds and air entry.  Musculoskeletal:     Right lower leg: No edema.     Left lower leg: No edema.     Comments: Low Back Inspection: Normal appearance, Large body habitus, no spinal deformity, symmetrical. Palpation: No tenderness over spinous processes. Bilateral lumbar paraspinal muscles tender and with hypertonicity/spasm. ROM: limited AROM in all directions forward flex / back extension, rotation L/R related to discomfort Special Testing: Seated SLR  negative for radicular and reproduced localized back pain bilaterally.  Strength: Bilateral hip flex/ext 5/5, knee flex/ext 5/5, ankle dorsiflex/plantarflex 5/5 Neurovascular: intact distal sensation to light touch   Skin:    General: Skin is warm and dry.     Capillary Refill: Capillary refill takes less than 2 seconds.  Neurological:     General: No focal deficit present.     Mental Status: He is alert and oriented to person, place, and time. Mental status is at baseline.     Cranial Nerves: No cranial nerve deficit.     Motor: No weakness.     Gait: Gait abnormal (antalgic).  Psychiatric:        Attention and Perception: Attention normal.        Mood and Affect: Mood and affect normal.        Behavior: Behavior normal. Behavior is cooperative.        Thought Content:  Thought content normal.        Judgment: Judgment normal.    Results for orders placed or performed during the hospital encounter of 04/05/18  Lipase, blood  Result Value Ref Range   Lipase 59 (H) 11 - 51 U/L  Comprehensive metabolic panel  Result Value Ref Range   Sodium 142 135 - 145 mmol/L   Potassium 3.3 (L) 3.5 - 5.1 mmol/L   Chloride 109 101 - 111 mmol/L   CO2 23 22 - 32 mmol/L   Glucose, Bld 148 (H) 65 - 99 mg/dL   BUN 20 6 - 20 mg/dL   Creatinine, Ser 1.07 0.61 - 1.24 mg/dL   Calcium 9.4 8.9 - 10.3 mg/dL   Total Protein 7.3 6.5 - 8.1 g/dL   Albumin 4.7 3.5 - 5.0 g/dL   AST 26 15 - 41 U/L   ALT 19 17 - 63 U/L   Alkaline Phosphatase 66 38 - 126 U/L   Total Bilirubin 0.8 0.3 - 1.2 mg/dL   GFR calc non Af Amer >60 >60 mL/min   GFR calc Af Amer >60 >60 mL/min   Anion gap 10 5 - 15  CBC  Result Value Ref Range   WBC 9.7 3.8 - 10.6 K/uL   RBC 4.50 4.40 - 5.90 MIL/uL   Hemoglobin 13.6 13.0 - 18.0 g/dL   HCT 39.9 (L) 40.0 - 52.0 %   MCV 88.7 80.0 - 100.0 fL   MCH 30.3 26.0 - 34.0 pg   MCHC 34.1 32.0 - 36.0 g/dL   RDW 13.8 11.5 - 14.5 %   Platelets 183 150 - 440 K/uL  Urinalysis, Complete w Microscopic  Result Value Ref Range   Color, Urine YELLOW (A) YELLOW   APPearance CLEAR (A) CLEAR   Specific Gravity, Urine 1.031 (H) 1.005 - 1.030   pH 5.0 5.0 - 8.0   Glucose, UA NEGATIVE NEGATIVE mg/dL   Hgb urine dipstick NEGATIVE NEGATIVE   Bilirubin Urine NEGATIVE NEGATIVE   Ketones, ur NEGATIVE NEGATIVE mg/dL   Protein, ur 30 (A) NEGATIVE mg/dL   Nitrite NEGATIVE NEGATIVE   Leukocytes, UA NEGATIVE NEGATIVE   RBC / HPF 0-5 0 - 5 RBC/hpf   WBC, UA 0-5 0 - 5 WBC/hpf   Bacteria, UA NONE SEEN NONE SEEN   Squamous Epithelial / LPF NONE SEEN 0 - 5   Mucus PRESENT       Assessment & Plan:   Problem List Items Addressed This Visit      Cardiovascular and Mediastinum   Essential hypertension Uncontrolled hypertension.  BP goal < 130/80.  Pt is not working on lifestyle  modifications.  Taking medications tolerating well without side effects. Pain is current complication.  Plan: 1. START lisinopril 10 mg once daily.  Discussed possible side effects of angioedema (rare), cough (common and reversible), kidney damage (rare, increase monitoring with start). 2. Obtain labs today/next week - BMP, CMP respectively 3. Encouraged heart healthy diet and increasing exercise to 30 minutes most days of the week. 4. Check BP 1-2 x per week at home, keep log, and bring to clinic at next appointment. 5. Follow up 6 weeks.      Other Visit Diagnoses    Needs flu shot      Relevant Orders   Flu Vaccine QUAD 6+ mos PF IM (Fluarix Quad PF) (Completed)   Back pain with radiculopathy       Insomnia due to medical condition     -  Primary Patient with chronic back pain, managed currently off opioids and with Dr. Holley Raring for procedural and medical therapy.  Patient not currently on any medications - stopped oxycodone since last visit. - Complicated by radiulopathy into feet and abdomen.  Cannot fully exclude other causes of peripheral neuropathy at this time, but will proceed as needed if not resolved at next follow-up visit.  Plan: 1. Manage pain to manage insomnia.  - START tizanidine 4 mg bid prn back pain/radiculopathy (#45 tabs for 30 days) patient aware of limit until reassessed. 2.  Can consider future gabapentin or Cymbalta for pain if needed.  Would prefer pain management clinic direction since patient is established. 3. Start LBP exercises - see AVS 4. Follow-up 6 weeks and with Dr. Holley Raring.      Follow up plan: Return in about 6 weeks (around 01/06/2019) for hypertension, back pain.  Cassell Smiles, DNP, AGPCNP-BC Adult Gerontology Primary Care Nurse Practitioner [facility] 11/28/2018, 1:41 PM

## 2018-11-29 ENCOUNTER — Telehealth: Payer: Self-pay

## 2018-11-29 DIAGNOSIS — M5410 Radiculopathy, site unspecified: Secondary | ICD-10-CM

## 2018-11-29 MED ORDER — DICLOFENAC SODIUM 75 MG PO TBEC: 75.0000 mg | DELAYED_RELEASE_TABLET | Freq: Two times a day (BID) | ORAL | 0 refills | Status: DC | PRN

## 2018-11-29 NOTE — Telephone Encounter (Signed)
The pt was notified of results, no questions or concerns.

## 2018-11-29 NOTE — Telephone Encounter (Signed)
The pt called complaining of numbness, tingling in the fingers and hand that is waking him up out of his sleep.  He contributes his insomnia to the numbness in his feet and hands. Please advise

## 2018-11-29 NOTE — Telephone Encounter (Signed)
Patient has been evaluated.  Needs to continue follow-up with pain clinic for radicular pain (nerve related pain from spine).  He may start using diclofenac tablets one tab twice daily to help reduce swelling on nerves that is most likely causing the tingling.    Can consider gabapentin instead after 2 weeks of NSAID treatment with diclofenac tablets.

## 2018-12-06 ENCOUNTER — Encounter: Payer: Self-pay | Admitting: Student in an Organized Health Care Education/Training Program

## 2018-12-06 ENCOUNTER — Ambulatory Visit
Payer: PRIVATE HEALTH INSURANCE | Attending: Student in an Organized Health Care Education/Training Program | Admitting: Student in an Organized Health Care Education/Training Program

## 2018-12-06 ENCOUNTER — Other Ambulatory Visit: Payer: Self-pay

## 2018-12-06 VITALS — BP 154/81 | HR 78 | Temp 98.3°F | Resp 18 | Ht 73.0 in | Wt 227.0 lb

## 2018-12-06 DIAGNOSIS — M48061 Spinal stenosis, lumbar region without neurogenic claudication: Secondary | ICD-10-CM

## 2018-12-06 DIAGNOSIS — M5416 Radiculopathy, lumbar region: Secondary | ICD-10-CM

## 2018-12-06 DIAGNOSIS — M51369 Other intervertebral disc degeneration, lumbar region without mention of lumbar back pain or lower extremity pain: Secondary | ICD-10-CM

## 2018-12-06 DIAGNOSIS — M5136 Other intervertebral disc degeneration, lumbar region: Secondary | ICD-10-CM | POA: Insufficient documentation

## 2018-12-06 DIAGNOSIS — G894 Chronic pain syndrome: Secondary | ICD-10-CM

## 2018-12-06 MED ORDER — OXYCODONE HCL 10 MG PO TABS: 10.0000 mg | ORAL_TABLET | Freq: Three times a day (TID) | ORAL | 0 refills | Status: AC | PRN

## 2018-12-06 MED ORDER — OXYCODONE HCL 10 MG PO TABS: 10.0000 mg | ORAL_TABLET | Freq: Three times a day (TID) | ORAL | 0 refills | Status: DC | PRN

## 2018-12-06 NOTE — Patient Instructions (Signed)
Oxycodone to last until 02/04/2019 has been escribed to your pharmacy.  GENERAL RISKS AND COMPLICATIONS  What are the risk, side effects and possible complications? Generally speaking, most procedures are safe.  However, with any procedure there are risks, side effects, and the possibility of complications.  The risks and complications are dependent upon the sites that are lesioned, or the type of nerve block to be performed.  The closer the procedure is to the spine, the more serious the risks are.  Great care is taken when placing the radio frequency needles, block needles or lesioning probes, but sometimes complications can occur. 1. Infection: Any time there is an injection through the skin, there is a risk of infection.  This is why sterile conditions are used for these blocks.  There are four possible types of infection. 1. Localized skin infection. 2. Central Nervous System Infection-This can be in the form of Meningitis, which can be deadly. 3. Epidural Infections-This can be in the form of an epidural abscess, which can cause pressure inside of the spine, causing compression of the spinal cord with subsequent paralysis. This would require an emergency surgery to decompress, and there are no guarantees that the patient would recover from the paralysis. 4. Discitis-This is an infection of the intervertebral discs.  It occurs in about 1% of discography procedures.  It is difficult to treat and it may lead to surgery.        2. Pain: the needles have to go through skin and soft tissues, will cause soreness.       3. Damage to internal structures:  The nerves to be lesioned may be near blood vessels or    other nerves which can be potentially damaged.       4. Bleeding: Bleeding is more common if the patient is taking blood thinners such as  aspirin, Coumadin, Ticiid, Plavix, etc., or if he/she have some genetic predisposition  such as hemophilia. Bleeding into the spinal canal can cause  compression of the spinal  cord with subsequent paralysis.  This would require an emergency surgery to  decompress and there are no guarantees that the patient would recover from the  paralysis.       5. Pneumothorax:  Puncturing of a lung is a possibility, every time a needle is introduced in  the area of the chest or upper back.  Pneumothorax refers to free air around the  collapsed lung(s), inside of the thoracic cavity (chest cavity).  Another two possible  complications related to a similar event would include: Hemothorax and Chylothorax.   These are variations of the Pneumothorax, where instead of air around the collapsed  lung(s), you may have blood or chyle, respectively.       6. Spinal headaches: They may occur with any procedures in the area of the spine.       7. Persistent CSF (Cerebro-Spinal Fluid) leakage: This is a rare problem, but may occur  with prolonged intrathecal or epidural catheters either due to the formation of a fistulous  track or a dural tear.       8. Nerve damage: By working so close to the spinal cord, there is always a possibility of  nerve damage, which could be as serious as a permanent spinal cord injury with  paralysis.       9. Death:  Although rare, severe deadly allergic reactions known as "Anaphylactic  reaction" can occur to any of the medications used.  10. Worsening of the symptoms:  We can always make thing worse.  What are the chances of something like this happening? Chances of any of this occuring are extremely low.  By statistics, you have more of a chance of getting killed in a motor vehicle accident: while driving to the hospital than any of the above occurring .  Nevertheless, you should be aware that they are possibilities.  In general, it is similar to taking a shower.  Everybody knows that you can slip, hit your head and get killed.  Does that mean that you should not shower again?  Nevertheless always keep in mind that statistics do not mean  anything if you happen to be on the wrong side of them.  Even if a procedure has a 1 (one) in a 1,000,000 (million) chance of going wrong, it you happen to be that one..Also, keep in mind that by statistics, you have more of a chance of having something go wrong when taking medications.  Who should not have this procedure? If you are on a blood thinning medication (e.g. Coumadin, Plavix, see list of "Blood Thinners"), or if you have an active infection going on, you should not have the procedure.  If you are taking any blood thinners, please inform your physician.  How should I prepare for this procedure?  Do not eat or drink anything at least six hours prior to the procedure.  Bring a driver with you .  It cannot be a taxi.  Come accompanied by an adult that can drive you back, and that is strong enough to help you if your legs get weak or numb from the local anesthetic.  Take all of your medicines the morning of the procedure with just enough water to swallow them.  If you have diabetes, make sure that you are scheduled to have your procedure done first thing in the morning, whenever possible.  If you have diabetes, take only half of your insulin dose and notify our nurse that you have done so as soon as you arrive at the clinic.  If you are diabetic, but only take blood sugar pills (oral hypoglycemic), then do not take them on the morning of your procedure.  You may take them after you have had the procedure.  Do not take aspirin or any aspirin-containing medications, at least eleven (11) days prior to the procedure.  They may prolong bleeding.  Wear loose fitting clothing that may be easy to take off and that you would not mind if it got stained with Betadine or blood.  Do not wear any jewelry or perfume  Remove any nail coloring.  It will interfere with some of our monitoring equipment.  NOTE: Remember that this is not meant to be interpreted as a complete list of all possible  complications.  Unforeseen problems may occur.  BLOOD THINNERS The following drugs contain aspirin or other products, which can cause increased bleeding during surgery and should not be taken for 2 weeks prior to and 1 week after surgery.  If you should need take something for relief of minor pain, you may take acetaminophen which is found in Tylenol,m Datril, Anacin-3 and Panadol. It is not blood thinner. The products listed below are.  Do not take any of the products listed below in addition to any listed on your instruction sheet.  A.P.C or A.P.C with Codeine Codeine Phosphate Capsules #3 Ibuprofen Ridaura  ABC compound Congesprin Imuran rimadil  Advil Cope Indocin Robaxisal  Alka-Seltzer Effervescent Pain Reliever and Antacid Coricidin or Coricidin-D  Indomethacin Rufen  Alka-Seltzer plus Cold Medicine Cosprin Ketoprofen S-A-C Tablets  Anacin Analgesic Tablets or Capsules Coumadin Korlgesic Salflex  Anacin Extra Strength Analgesic tablets or capsules CP-2 Tablets Lanoril Salicylate  Anaprox Cuprimine Capsules Levenox Salocol  Anexsia-D Dalteparin Magan Salsalate  Anodynos Darvon compound Magnesium Salicylate Sine-off  Ansaid Dasin Capsules Magsal Sodium Salicylate  Anturane Depen Capsules Marnal Soma  APF Arthritis pain formula Dewitt's Pills Measurin Stanback  Argesic Dia-Gesic Meclofenamic Sulfinpyrazone  Arthritis Bayer Timed Release Aspirin Diclofenac Meclomen Sulindac  Arthritis pain formula Anacin Dicumarol Medipren Supac  Analgesic (Safety coated) Arthralgen Diffunasal Mefanamic Suprofen  Arthritis Strength Bufferin Dihydrocodeine Mepro Compound Suprol  Arthropan liquid Dopirydamole Methcarbomol with Aspirin Synalgos  ASA tablets/Enseals Disalcid Micrainin Tagament  Ascriptin Doan's Midol Talwin  Ascriptin A/D Dolene Mobidin Tanderil  Ascriptin Extra Strength Dolobid Moblgesic Ticlid  Ascriptin with Codeine Doloprin or Doloprin with Codeine Momentum Tolectin  Asperbuf Duoprin  Mono-gesic Trendar  Aspergum Duradyne Motrin or Motrin IB Triminicin  Aspirin plain, buffered or enteric coated Durasal Myochrisine Trigesic  Aspirin Suppositories Easprin Nalfon Trillsate  Aspirin with Codeine Ecotrin Regular or Extra Strength Naprosyn Uracel  Atromid-S Efficin Naproxen Ursinus  Auranofin Capsules Elmiron Neocylate Vanquish  Axotal Emagrin Norgesic Verin  Azathioprine Empirin or Empirin with Codeine Normiflo Vitamin E  Azolid Emprazil Nuprin Voltaren  Bayer Aspirin plain, buffered or children's or timed BC Tablets or powders Encaprin Orgaran Warfarin Sodium  Buff-a-Comp Enoxaparin Orudis Zorpin  Buff-a-Comp with Codeine Equegesic Os-Cal-Gesic   Buffaprin Excedrin plain, buffered or Extra Strength Oxalid   Bufferin Arthritis Strength Feldene Oxphenbutazone   Bufferin plain or Extra Strength Feldene Capsules Oxycodone with Aspirin   Bufferin with Codeine Fenoprofen Fenoprofen Pabalate or Pabalate-SF   Buffets II Flogesic Panagesic   Buffinol plain or Extra Strength Florinal or Florinal with Codeine Panwarfarin   Buf-Tabs Flurbiprofen Penicillamine   Butalbital Compound Four-way cold tablets Penicillin   Butazolidin Fragmin Pepto-Bismol   Carbenicillin Geminisyn Percodan   Carna Arthritis Reliever Geopen Persantine   Carprofen Gold's salt Persistin   Chloramphenicol Goody's Phenylbutazone   Chloromycetin Haltrain Piroxlcam   Clmetidine heparin Plaquenil   Cllnoril Hyco-pap Ponstel   Clofibrate Hydroxy chloroquine Propoxyphen         Before stopping any of these medications, be sure to consult the physician who ordered them.  Some, such as Coumadin (Warfarin) are ordered to prevent or treat serious conditions such as "deep thrombosis", "pumonary embolisms", and other heart problems.  The amount of time that you may need off of the medication may also vary with the medication and the reason for which you were taking it.  If you are taking any of these medications, please  make sure you notify your pain physician before you undergo any procedures.   Epidural Steroid Injection Patient Information  Description: The epidural space surrounds the nerves as they exit the spinal cord.  In some patients, the nerves can be compressed and inflamed by a bulging disc or a tight spinal canal (spinal stenosis).  By injecting steroids into the epidural space, we can bring irritated nerves into direct contact with a potentially helpful medication.  These steroids act directly on the irritated nerves and can reduce swelling and inflammation which often leads to decreased pain.  Epidural steroids may be injected anywhere along the spine and from the neck to the low back depending upon the location of your pain.   After numbing the skin with local anesthetic (  like Novocaine), a small needle is passed into the epidural space slowly.  You may experience a sensation of pressure while this is being done.  The entire block usually last less than 10 minutes.  Conditions which may be treated by epidural steroids:   Low back and leg pain  Neck and arm pain  Spinal stenosis  Post-laminectomy syndrome  Herpes zoster (shingles) pain  Pain from compression fractures  Preparation for the injection:  1. Do not eat any solid food or dairy products within 8 hours of your appointment.  2. You may drink clear liquids up to 3 hours before appointment.  Clear liquids include water, black coffee, juice or soda.  No milk or cream please. 3. You may take your regular medication, including pain medications, with a sip of water before your appointment  Diabetics should hold regular insulin (if taken separately) and take 1/2 normal NPH dos the morning of the procedure.  Carry some sugar containing items with you to your appointment. 4. A driver must accompany you and be prepared to drive you home after your procedure.  5. Bring all your current medications with your. 6. An IV may be inserted and  sedation may be given at the discretion of the physician.   7. A blood pressure cuff, EKG and other monitors will often be applied during the procedure.  Some patients may need to have extra oxygen administered for a short period. 8. You will be asked to provide medical information, including your allergies, prior to the procedure.  We must know immediately if you are taking blood thinners (like Coumadin/Warfarin)  Or if you are allergic to IV iodine contrast (dye). We must know if you could possible be pregnant.  Possible side-effects:  Bleeding from needle site  Infection (rare, may require surgery)  Nerve injury (rare)  Numbness & tingling (temporary)  Difficulty urinating (rare, temporary)  Spinal headache ( a headache worse with upright posture)  Light -headedness (temporary)  Pain at injection site (several days)  Decreased blood pressure (temporary)  Weakness in arm/leg (temporary)  Pressure sensation in back/neck (temporary)  Call if you experience:  Fever/chills associated with headache or increased back/neck pain.  Headache worsened by an upright position.  New onset weakness or numbness of an extremity below the injection site  Hives or difficulty breathing (go to the emergency room)  Inflammation or drainage at the infection site  Severe back/neck pain  Any new symptoms which are concerning to you  Please note:  Although the local anesthetic injected can often make your back or neck feel good for several hours after the injection, the pain will likely return.  It takes 3-7 days for steroids to work in the epidural space.  You may not notice any pain relief for at least that one week.  If effective, we will often do a series of three injections spaced 3-6 weeks apart to maximally decrease your pain.  After the initial series, we generally will wait several months before considering a repeat injection of the same type.  If you have any questions, please  call [phone] [facility]

## 2018-12-06 NOTE — Progress Notes (Signed)
Patient's Name: Anthony Nguyen  MRN: [id number]  Referring Provider: Mikey College, *  DOB: 1955-10-30  PCP: Mikey College, NP  DOS: 12/06/2018  Note by: [name], MD  Service setting: Ambulatory outpatient  Specialty: Interventional Pain Management  Location: [facility] (AMB) Pain Management Facility    Patient type: Established   Primary Reason(s) for Visit: Encounter for prescription drug management. (Level of risk: moderate)  CC: Back Pain (low)  HPI  Anthony Nguyen is a 63 y.o. year old, male patient, who comes today for a medication management evaluation. He has Sleep disturbance; Allergy; GERD (gastroesophageal reflux disease); Tobacco abuse counseling; Ringworm; Chronic nasal congestion; Benign paroxysmal positional vertigo; Primary osteoarthritis of left knee; Lumbar radiculopathy; Lumbar degenerative disc disease; Lumbar foraminal stenosis; History of left knee replacement; Primary osteoarthritis of both knees; Chronic pain syndrome; Benign prostatic hyperplasia with urinary frequency; and Essential hypertension on their problem list. His primarily concern today is the Back Pain (low)  Pain Assessment: Location: Lower, Right, Left Back Radiating: to right buttock and right thigh Onset: More than a month ago Duration: Chronic pain Quality: Constant, Sharp, Numbness(right anterior thigh "stays numb") Severity: 10-Worst pain ever/10 (subjective, self-reported pain score)  Note: Reported level is inconsistent with clinical observations.                         When using our objective Pain Scale, levels between 6 and 10/10 are said to belong in an emergency room, as it progressively worsens from a 6/10, described as severely limiting, requiring emergency care not usually available at an outpatient pain management facility. At a 6/10 level, communication becomes difficult and requires great effort. Assistance to reach the emergency department may be required. Facial flushing  and profuse sweating along with potentially dangerous increases in heart rate and blood pressure will be evident. Effect on ADL: limits daily activities Timing: Constant Modifying factors: meds, laying down BP: (!) 154/81  HR: 78  Anthony Nguyen was last scheduled for an appointment on 11/08/2018 for medication management. During today's appointment we reviewed Anthony Nguyen's chronic pain status, as well as his outpatient medication regimen.  Patient states that his knee pain has improved.  Is now endorsing right back pain that radiates into his right buttock and right thigh in a posterior lateral distribution.  The patient  reports no history of drug use. His body mass index is 29.95 kg/m.  Further details on both, my assessment(s), as well as the proposed treatment plan, please see below.  Controlled Substance Pharmacotherapy Assessment REMS (Risk Evaluation and Mitigation Strategy)   11/08/2018  1   11/08/2018  Oxycodone Hcl 10 MG Tablet  75.00 25 Bi Lat   [id number]   [facility] (4231)   0  45.00 MME  Private Pay   [place]     Rise Patience, RN  12/06/2018 10:13 AM  Sign when Signing Visit Nursing Pain Medication Assessment:  Safety precautions to be maintained throughout the outpatient stay will include: orient to surroundings, keep bed in low position, maintain call bell within reach at all times, provide assistance with transfer out of bed and ambulation.  Medication Inspection Compliance: Pill count conducted under aseptic conditions, in front of the patient. Neither the pills nor the bottle was removed from the patient's sight at any time. Once count was completed pills were immediately returned to the patient in their original bottle.  Medication: Oxycodone 10 mg Pill/Patch Count: o of 75 pills remain Pill/Patch Appearance:  Markings consistent with prescribed medication Bottle Appearance: Standard pharmacy container. Clearly labeled. Filled Date: 1 / 65 / 2020 Last Medication intake:  Ran  out of medicine more than 48 hours ago   Pt states he ran out of pills over a week ago Pharmacokinetics: Liberation and absorption (onset of action): WNL Distribution (time to peak effect): WNL Metabolism and excretion (duration of action): WNL         Pharmacodynamics: Desired effects: Analgesia: Anthony Nguyen reports >50% benefit. Functional ability: Patient reports that medication allows him to accomplish basic ADLs Clinically meaningful improvement in function (CMIF): Sustained CMIF goals met Perceived effectiveness: Described as relatively effective, allowing for increase in activities of daily living (ADL) Undesirable effects: Side-effects or Adverse reactions: None reported Monitoring: [place] PMP: Online review of the past 41-monthperiod conducted. Compliant with practice rules and regulations Last UDS on record: Summary  Date Value Ref Range Status  01/20/2018 FINAL  Final    Comment:    ==================================================================== TOXASSURE SELECT 13 (MW) ==================================================================== Test                             Result       Flag       Units Drug Present and Declared for Prescription Verification   Oxycodone                      525          EXPECTED   ng/mg creat   Oxymorphone                    331          EXPECTED   ng/mg creat   Noroxycodone                   2011         EXPECTED   ng/mg creat   Noroxymorphone                 174          EXPECTED   ng/mg creat    Sources of oxycodone are scheduled prescription medications.    Oxymorphone, noroxycodone, and noroxymorphone are expected    metabolites of oxycodone. Oxymorphone is also available as a    scheduled prescription medication. ==================================================================== Test                      Result    Flag   Units      Ref Range   Creatinine              159              mg/dL       >=20 ==================================================================== Declared Medications:  The flagging and interpretation on this report are based on the  following declared medications.  Unexpected results may arise from  inaccuracies in the declared medications.  **Note: The testing scope of this panel includes these medications:  Oxycodone (Percocet)  **Note: The testing scope of this panel does not include following  reported medications:  Acetaminophen (Percocet)  Aspirin (Aspirin 81)  Diclofenac  Escitalopram (Lexapro)  Fluticasone (Flonase)  Melatonin ==================================================================== For clinical consultation, please call ([phone] ====================================================================    UDS interpretation: Compliant          Medication Assessment Form: Reviewed. Patient indicates being compliant with therapy Treatment compliance: Compliant Risk Assessment  Profile: Aberrant behavior: See initial evaluations. None observed or detected today Comorbid factors increasing risk of overdose: See initial evaluation. No additional risks detected today Opioid risk tool (ORT):  Opioid Risk  12/06/2018  Alcohol 0  Illegal Drugs 0  Rx Drugs 0  Alcohol 0  Illegal Drugs 0  Rx Drugs 0  Age between 16-45 years  0  History of Preadolescent Sexual Abuse 0  Psychological Disease 0  Depression 0  Opioid Risk Tool Scoring 0  Opioid Risk Interpretation Low Risk    ORT Scoring interpretation table:  Score <3 = Low Risk for SUD  Score between 4-7 = Moderate Risk for SUD  Score >8 = High Risk for Opioid Abuse   Risk of substance use disorder (SUD): Low  Risk Mitigation Strategies:  Patient Counseling: Covered Patient-Prescriber Agreement (PPA): Present and active  Notification to other healthcare providers: Done  Pharmacologic Plan: No change in therapy, at this time.             Laboratory Chemistry  Inflammation  Markers (CRP: Acute Phase) (ESR: Chronic Phase) Lab Results  Component Value Date   ESRSEDRATE 30 (H) 09/29/2017                         Rheumatology Markers No results found for: RF, ANA, LABURIC, URICUR, LYMEIGGIGMAB, LYMEABIGMQN, HLAB27                      Renal Function Markers Lab Results  Component Value Date   BUN 20 04/05/2018   CREATININE 1.07 04/05/2018   GFRAA >60 04/05/2018   GFRNONAA >60 04/05/2018                             Hepatic Function Markers Lab Results  Component Value Date   AST 26 04/05/2018   ALT 19 04/05/2018   ALBUMIN 4.7 04/05/2018   ALKPHOS 66 04/05/2018   LIPASE 59 (H) 04/05/2018                        Electrolytes Lab Results  Component Value Date   NA 142 04/05/2018   K 3.3 (L) 04/05/2018   CL 109 04/05/2018   CALCIUM 9.4 04/05/2018                        Neuropathy Markers No results found for: VITAMINB12, FOLATE, HGBA1C, HIV                      CNS Tests No results found for: COLORCSF, APPEARCSF, RBCCOUNTCSF, WBCCSF, POLYSCSF, LYMPHSCSF, EOSCSF, PROTEINCSF, GLUCCSF, JCVIRUS, CSFOLI, IGGCSF                      Bone Pathology Markers No results found for: VD25OH, [id number], [id number], [id number], 25OHVITD1, 25OHVITD2, 25OHVITD3, TESTOFREE, TESTOSTERONE                       Coagulation Parameters Lab Results  Component Value Date   INR 0.96 09/29/2017   LABPROT 12.7 09/29/2017   APTT 31 09/29/2017   PLT 183 04/05/2018                        Cardiovascular Markers Lab Results  Component Value Date   TROPONINI <0.03 11/16/2014   HGB 13.6 04/05/2018  HCT 39.9 (L) 04/05/2018                         CA Markers No results found for: CEA, CA125, LABCA2                      Endocrine Markers Lab Results  Component Value Date   TSH 0.95 08/17/2013                        Note: Lab results reviewed.  Recent Diagnostic Imaging Results  CT ABDOMEN PELVIS W CONTRAST CLINICAL DATA:  Lower abdominal pain radiating to  the low back for 4 days.  EXAM: CT ABDOMEN AND PELVIS WITH CONTRAST  TECHNIQUE: Multidetector CT imaging of the abdomen and pelvis was performed using the standard protocol following bolus administration of intravenous contrast.  CONTRAST:  158m ISOVUE-370 IOPAMIDOL (ISOVUE-370) INJECTION 76%  COMPARISON:  11/07/2014  FINDINGS: Lower chest: Dependent changes in the lung bases. Coronary artery calcifications.  Hepatobiliary: Mild diffuse fatty infiltration of the liver. No focal liver lesions. Gallbladder and bile ducts are unremarkable.  Pancreas: Unremarkable. No pancreatic ductal dilatation or surrounding inflammatory changes.  Spleen: Normal in size without focal abnormality.  Adrenals/Urinary Tract: Adrenal glands are unremarkable. Kidneys are normal, without renal calculi, focal lesion, or hydronephrosis. Bladder wall is not thickened. No filling defects. Minimal left posterior bladder diverticulum.  Stomach/Bowel: Stomach is within normal limits. Appendix appears normal. No evidence of bowel wall thickening, distention, or inflammatory changes.  Vascular/Lymphatic: Aortic atherosclerosis. No enlarged abdominal or pelvic lymph nodes.  Reproductive: Prostate gland is enlarged, measuring 4.7 cm.  Other: No free air or free fluid in the abdomen. Minimal periumbilical hernia containing fat.  Musculoskeletal: Spondylolysis with minimal spondylolisthesis at L5-S1. Healing fractures of the right second and third lumbar transverse processes. Old bilateral rib fractures.  IMPRESSION: 1. Mild fatty infiltration of the liver. 2. Small bladder diverticulum. No bladder wall thickening or filling defect. 3. Aortic atherosclerosis. 4. Enlarged prostate gland. 5. 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  Electronically Signed   By: WLucienne CapersM.D.   On: 04/06/2018 01:52  Complexity Note: Imaging results reviewed. Results shared with Anthony Nguyen, using  Layman's terms.                         Meds   Current Outpatient Medications:  .  diclofenac (VOLTAREN) 75 MG EC tablet, Take 1 tablet (75 mg total) by mouth 2 (two) times daily as needed for moderate pain., Disp: 30 tablet, Rfl: 0 .  Diclofenac Sodium 3 % GEL, Place 1 application onto the skin 3 (three) times daily as needed (left knee pain)., Disp: 2 Tube, Rfl: 2 .  lisinopril (PRINIVIL,ZESTRIL) 10 MG tablet, Take 1 tablet (10 mg total) by mouth daily., Disp: 90 tablet, Rfl: 1 .  Oxycodone HCl 10 MG TABS, Take 1 tablet (10 mg total) by mouth 3 (three) times daily as needed for up to 30 days. Max 75, Disp: 75 tablet, Rfl: 0 .  Melatonin 1 MG TABS, Take 1 tablet by mouth at bedtime as needed., Disp: , Rfl:  .  [START ON 01/05/2019] Oxycodone HCl 10 MG TABS, Take 1 tablet (10 mg total) by mouth every 8 (eight) hours as needed for up to 30 days., Disp: 75 tablet, Rfl: 0  ROS  Constitutional: Denies any fever or chills Gastrointestinal: No reported hemesis, hematochezia, vomiting,  or acute GI distress Musculoskeletal: Denies any acute onset joint swelling, redness, loss of ROM, or weakness Neurological: No reported episodes of acute onset apraxia, aphasia, dysarthria, agnosia, amnesia, paralysis, loss of coordination, or loss of consciousness  Allergies  Anthony Nguyen has No Known Allergies.  PFSH  Drug: Anthony Nguyen  reports no history of drug use. Alcohol:  reports no history of alcohol use. Tobacco:  reports that he has been smoking cigarettes. He has a 30.00 pack-year smoking history. He has never used smokeless tobacco. Medical:  has a past medical history of Allergy, Arthritis, Cancer ([place]), Chronic back pain, Current every day smoker, Diverticulosis, GERD (gastroesophageal reflux disease), H/O diverticulitis of colon, Restless leg syndrome, and Sciatic pain. Surgical: Anthony Nguyen  has a past surgical history that includes Tonsillectomy and adenoidectomy; Total knee arthroplasty  (Left, 10/14/2017); skin cancer removal; and Joint replacement (Left, 10/14/2018). Family: family history includes Alzheimer's disease in his father; Cancer (age of onset: 55) in his sister; Diabetes in his sister; Healthy in his brother, son, son, son, and son; Heart disease in his brother; Hypertension in his mother; Prostate cancer (age of onset: 29) in his father.  Constitutional Exam  General appearance: Well nourished, well developed, and well hydrated. In no apparent acute distress Vitals:   12/06/18 1015  BP: (!) 154/81  Pulse: 78  Resp: 18  Temp: 98.3 F (36.8 C)  TempSrc: Oral  SpO2: 99%  Weight: 227 lb (103 kg)  Height: _0  (1.854 m)   BMI Assessment: Estimated body mass index is 29.95 kg/m as calculated from the following:   Height as of this encounter: _1  (1.854 m).   Weight as of this encounter: 227 lb (103 kg).  BMI interpretation table: BMI level Category Range association with higher incidence of chronic pain  <18 kg/m2 Underweight   18.5-24.9 kg/m2 Ideal body weight   25-29.9 kg/m2 Overweight Increased incidence by 20%  30-34.9 kg/m2 Obese (Class I) Increased incidence by 68%  35-39.9 kg/m2 Severe obesity (Class II) Increased incidence by 136%  >40 kg/m2 Extreme obesity (Class III) Increased incidence by 254%   Patient's current BMI Ideal Body weight  Body mass index is 29.95 kg/m. Ideal body weight: 79.9 kg (176 lb 2.4 oz) Adjusted ideal body weight: 89.1 kg (196 lb 7.8 oz)   BMI Readings from Last 4 Encounters:  12/06/18 29.95 kg/m  11/25/18 29.98 kg/m  11/08/18 30.61 kg/m  09/26/18 30.32 kg/m   Wt Readings from Last 4 Encounters:  12/06/18 227 lb (103 kg)  11/25/18 227 lb 3.2 oz (103.1 kg)  11/08/18 232 lb (105.2 kg)  09/26/18 229 lb 12.8 oz (104.2 kg)  Psych/Mental status: Alert, oriented x 3 (person, place, & time)       Eyes: PERLA Respiratory: No evidence of acute respiratory distress  Cervical Spine Area Exam  Skin & Axial  Inspection: No masses, redness, edema, swelling, or associated skin lesions Alignment: Symmetrical Functional ROM: Unrestricted ROM      Stability: No instability detected Muscle Tone/Strength: Functionally intact. No obvious neuro-muscular anomalies detected. Sensory (Neurological): Unimpaired Palpation: No palpable anomalies              Upper Extremity (UE) Exam    Side: Right upper extremity  Side: Left upper extremity  Skin & Extremity Inspection: Skin color, temperature, and hair growth are WNL. No peripheral edema or cyanosis. No masses, redness, swelling, asymmetry, or associated skin lesions. No contractures.  Skin & Extremity Inspection: Skin color, temperature, and hair  growth are WNL. No peripheral edema or cyanosis. No masses, redness, swelling, asymmetry, or associated skin lesions. No contractures.  Functional ROM: Unrestricted ROM          Functional ROM: Unrestricted ROM          Muscle Tone/Strength: Functionally intact. No obvious neuro-muscular anomalies detected.  Muscle Tone/Strength: Functionally intact. No obvious neuro-muscular anomalies detected.  Sensory (Neurological): Unimpaired          Sensory (Neurological): Unimpaired          Palpation: No palpable anomalies              Palpation: No palpable anomalies              Provocative Test(s):  Phalen's test: deferred Tinel's test: deferred Apley's scratch test (touch opposite shoulder):  Action 1 (Across chest): deferred Action 2 (Overhead): deferred Action 3 (LB reach): deferred   Provocative Test(s):  Phalen's test: deferred Tinel's test: deferred Apley's scratch test (touch opposite shoulder):  Action 1 (Across chest): deferred Action 2 (Overhead): deferred Action 3 (LB reach): deferred    Thoracic Spine Area Exam  Skin & Axial Inspection: No masses, redness, or swelling Alignment: Symmetrical Functional ROM: Unrestricted ROM Stability: No instability detected Muscle Tone/Strength: Functionally  intact. No obvious neuro-muscular anomalies detected. Sensory (Neurological): Unimpaired Muscle strength & Tone: No palpable anomalies  Lumbar Spine Area Exam  Skin & Axial Inspection: No masses, redness, or swelling Alignment: Symmetrical Functional ROM: Decreased ROM       Stability: No instability detected Muscle Tone/Strength: Functionally intact. No obvious neuro-muscular anomalies detected. Sensory (Neurological): Dermatomal pain pattern RLE Palpation: No palpable anomalies       Provocative Tests: Hyperextension/rotation test: (+) due to pain. Lumbar quadrant test (Kemp's test): (+) on the right for foraminal stenosis Lateral bending test: deferred today       Patrick's Maneuver: deferred today                   FABER* test: deferred today                   S-I anterior distraction/compression test: deferred today         S-I lateral compression test: deferred today         S-I Thigh-thrust test: deferred today         S-I Gaenslen's test: deferred today         *(Flexion, ABduction and External Rotation)  Gait & Posture Assessment  Ambulation: Unassisted Gait: Relatively normal for age and body habitus Posture: WNL   Lower Extremity Exam    Side: Right lower extremity  Side: Left lower extremity  Stability: No instability observed          Stability: No instability observed          Skin & Extremity Inspection: Skin color, temperature, and hair growth are WNL. No peripheral edema or cyanosis. No masses, redness, swelling, asymmetry, or associated skin lesions. No contractures.  Skin & Extremity Inspection: Skin color, temperature, and hair growth are WNL. No peripheral edema or cyanosis. No masses, redness, swelling, asymmetry, or associated skin lesions. No contractures.  Functional ROM: Pain restricted ROM for hip and knee joints          Functional ROM: Unrestricted ROM                  Muscle Tone/Strength: Functionally intact. No obvious neuro-muscular anomalies  detected.  Muscle Tone/Strength: Functionally intact. No obvious  neuro-muscular anomalies detected.  Sensory (Neurological): Dermatomal pain pattern top of foot & big toe (L5)  Sensory (Neurological): Unimpaired        DTR: Patellar: 1+: trace Achilles: deferred today Plantar: deferred today  DTR: Patellar: deferred today Achilles: deferred today Plantar: deferred today  Palpation: No palpable anomalies  Palpation: No palpable anomalies   Assessment   Status Diagnosis  Having a Flare-up Persistent Persistent 1. Lumbar radiculopathy   2. Lumbar degenerative disc disease   3. Chronic pain syndrome   4. Lumbar foraminal stenosis      Lumbar radiculopathy: Flareup of right lumbar radicular symptoms.  Reviewed CT of lumbar spine.  Moderate impingement at L2-L3.  Discussed right lumbar epidural steroid injection at this level.  Risks and benefits discussed and patient would like to proceed.  Refill of oxycodone as below.  Will hopefully wean at patient's next visit to twice daily PRN.  Patient instructed to continue diclofenac gel.  Patient also takes ibuprofen as needed which he is instructed to continue.  Will obtain UDS at next visit.  Patient states that he ran out of his medications over 1 week ago due to increased right radicular pain.  As a result his UDS will be negative today.  I instructed the patient to take his medications as prescribed.  Plan of Care  Pharmacotherapy (Medications Ordered): Meds ordered this encounter  Medications  . Oxycodone HCl 10 MG TABS    Sig: Take 1 tablet (10 mg total) by mouth 3 (three) times daily as needed for up to 30 days. Max 75    Dispense:  75 tablet    Refill:  0    Do not place this medication, or any other prescription from our practice, on "Automatic Refill". Patient may have prescription filled one day early if pharmacy is closed on scheduled refill date.  . Oxycodone HCl 10 MG TABS    Sig: Take 1 tablet (10 mg total) by mouth every 8  (eight) hours as needed for up to 30 days.    Dispense:  75 tablet    Refill:  0    Do not place this medication, or any other prescription from our practice, on "Automatic Refill". Patient may have prescription filled one day early if pharmacy is closed on scheduled refill date. Max 75/month   Lab-work, procedure(s), and/or referral(s): Orders Placed This Encounter  Procedures  . Lumbar Epidural Injection    Future considerations: Left genicular nerve block  Time Note: Greater than 50% of the 25 minute(s) of face-to-face time spent with Anthony Nguyen, was spent in counseling/coordination of care regarding: Anthony Nguyen's primary cause of pain, the treatment plan, treatment alternatives, the risks and possible complications of proposed treatment, medication side effects, going over the informed consent, the opioid analgesic risks and possible complications, the appropriate use of his medications, realistic expectations, the goals of pain management (increased in functionality), the medication agreement and the patient's responsibilities when it comes to controlled substances.  Provider-requested follow-up: Return in about 1 week (around 12/13/2018) for Procedure.  No future appointments.  Primary Care Physician: Mikey College, NP Location: [facility] Out[facility] Management Facility Note by: [name], M.D Date: 12/06/2018; Time: 10:42 AM  There are no Patient Instructions on file for this visit.

## 2018-12-06 NOTE — Progress Notes (Signed)
Nursing Pain Medication Assessment:  Safety precautions to be maintained throughout the outpatient stay will include: orient to surroundings, keep bed in low position, maintain call bell within reach at all times, provide assistance with transfer out of bed and ambulation.  Medication Inspection Compliance: Pill count conducted under aseptic conditions, in front of the patient. Neither the pills nor the bottle was removed from the patient's sight at any time. Once count was completed pills were immediately returned to the patient in their original bottle.  Medication: Oxycodone 10 mg Pill/Patch Count: o of 75 pills remain Pill/Patch Appearance: Markings consistent with prescribed medication Bottle Appearance: Standard pharmacy container. Clearly labeled. Filled Date: 1 / 24 / 2020 Last Medication intake:  Ran out of medicine more than 48 hours ago   Pt states he ran out of pills over a week ago

## 2018-12-28 ENCOUNTER — Ambulatory Visit
Admission: RE | Admit: 2018-12-28 | Discharge: 2018-12-28 | Disposition: A | Discharge status: Home / Self Care | Payer: PRIVATE HEALTH INSURANCE | Source: Ambulatory Visit | Attending: Student in an Organized Health Care Education/Training Program | Admitting: Student in an Organized Health Care Education/Training Program

## 2018-12-28 ENCOUNTER — Ambulatory Visit (HOSPITAL_BASED_OUTPATIENT_CLINIC_OR_DEPARTMENT_OTHER): Payer: PRIVATE HEALTH INSURANCE | Admitting: Student in an Organized Health Care Education/Training Program

## 2018-12-28 ENCOUNTER — Other Ambulatory Visit: Payer: Self-pay

## 2018-12-28 ENCOUNTER — Encounter: Payer: Self-pay | Admitting: Student in an Organized Health Care Education/Training Program

## 2018-12-28 VITALS — BP 153/84 | HR 68 | Temp 97.9°F | Resp 16 | Ht 73.0 in | Wt 227.0 lb

## 2018-12-28 DIAGNOSIS — M5416 Radiculopathy, lumbar region: Secondary | ICD-10-CM | POA: Diagnosis not present

## 2018-12-28 MED ORDER — DEXAMETHASONE SODIUM PHOSPHATE 10 MG/ML IJ SOLN
10.0000 mg | Freq: Once | INTRAMUSCULAR | Status: AC
  Administered 2018-12-28: 10 mg

## 2018-12-28 MED ORDER — ROPIVACAINE HCL 2 MG/ML IJ SOLN
INTRAMUSCULAR | Status: AC
  Filled 2018-12-28: qty 10

## 2018-12-28 MED ORDER — SODIUM CHLORIDE 0.9% FLUSH
2.0000 mL | Freq: Once | INTRAVENOUS | Status: AC
  Administered 2018-12-28: 10 mL

## 2018-12-28 MED ORDER — ROPIVACAINE HCL 2 MG/ML IJ SOLN
2.0000 mL | Freq: Once | INTRAMUSCULAR | Status: AC
  Administered 2018-12-28: 10 mL via EPIDURAL

## 2018-12-28 MED ORDER — LIDOCAINE HCL 2 % IJ SOLN
INTRAMUSCULAR | Status: AC
  Filled 2018-12-28: qty 20

## 2018-12-28 MED ORDER — IOPAMIDOL (ISOVUE-M 200) INJECTION 41%
10.0000 mL | Freq: Once | INTRAMUSCULAR | Status: AC
  Administered 2018-12-28: 10 mL via EPIDURAL

## 2018-12-28 MED ORDER — IOPAMIDOL (ISOVUE-M 200) INJECTION 41%
INTRAMUSCULAR | Status: AC
  Filled 2018-12-28: qty 10

## 2018-12-28 MED ORDER — SODIUM CHLORIDE (PF) 0.9 % IJ SOLN
INTRAMUSCULAR | Status: AC
  Filled 2018-12-28: qty 10

## 2018-12-28 MED ORDER — DEXAMETHASONE SODIUM PHOSPHATE 10 MG/ML IJ SOLN
INTRAMUSCULAR | Status: AC
Start: 2018-12-28 — End: ?
  Filled 2018-12-28: qty 1

## 2018-12-28 NOTE — Patient Instructions (Signed)

## 2018-12-28 NOTE — Progress Notes (Signed)
Patient's Name: Anthony Nguyen  MRN: [id number]  Referring Provider: Mikey College, *  DOB: 03-24-1956  PCP: Mikey College, NP  DOS: 12/28/2018  Note by: [name], MD  Service setting: Ambulatory outpatient  Specialty: Interventional Pain Management  Patient type: Established  Location: [facility] (AMB) Pain Management Facility  Visit type: Interventional Procedure   Primary Reason for Visit: Interventional Pain Management Treatment. CC: Back Pain (lower)  Procedure:       Anesthesia, Analgesia, Anxiolysis:  Type: Therapeutic Inter-Laminar Epidural Steroid Injection #3  Region: Lumbar Level: L4-5 Level. Laterality: Right-Sided         Type: Local Anesthesia Indication(s): Analgesia and Anxiety Route: Infiltration ([place]/IM) IV Access: Declined Sedation: Declined  Local Anesthetic: Lidocaine 1%   Indications: 1. Lumbar radiculopathy    Pain Score: Pre-procedure: 6 /10 Post-procedure: 0-No pain/10  Pre-op Assessment:  Mr. Menzer is a 63 y.o. (year old), male patient, seen today for interventional treatment. 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 Mr. Emmick has a current medication list which includes the following prescription(s): diclofenac, diclofenac sodium, lisinopril, oxycodone hcl, oxycodone hcl, and melatonin. His primarily concern today is the Back Pain (lower)  Initial Vital Signs:  Pulse Rate: 65 Temp: 97.9 F (36.6 C) Resp: 18 BP: (!) 145/91 SpO2: 98 %  BMI: Estimated body mass index is 29.95 kg/m as calculated from the following:   Height as of this encounter: 6\' 1"  (1.854 m).   Weight as of this encounter: 227 lb (103 kg).  Risk Assessment: Allergies: Reviewed. He has No Known Allergies.  Allergy Precautions: None required Coagulopathies: Reviewed. None identified.  Blood-thinner therapy: None  at this time Active Infection(s): Reviewed. None identified. Mr. Sturdevant is afebrile  Site Confirmation: Mr. Schappell was asked to confirm the procedure and laterality before marking the site Procedure checklist: Completed Consent: Before the procedure and under the influence of no sedative(s), amnesic(s), or anxiolytics, the patient was informed of the treatment options, risks and possible complications. To fulfill our ethical and legal obligations, as recommended by the American Medical Association's Code of Ethics, I have informed the patient of my clinical impression; the nature and purpose of the treatment or procedure; the risks, benefits, and possible complications of the intervention; the alternatives, including doing nothing; the risk(s) and benefit(s) of the alternative treatment(s) or procedure(s); and the risk(s) and benefit(s) of doing nothing. The patient was provided information about the general risks and possible complications associated with the procedure. These may include, but are not limited to: failure to achieve desired goals, infection, bleeding, organ or nerve damage, allergic reactions, paralysis, and death. In addition, the patient was informed of those risks and complications associated to Spine-related procedures, such as failure to decrease pain; infection (i.e.: Meningitis, epidural or intraspinal abscess); bleeding (i.e.: epidural hematoma, subarachnoid hemorrhage, or any other type of intraspinal or peri-dural bleeding); organ or nerve damage (i.e.: Any type of peripheral nerve, nerve root, or spinal cord injury) with subsequent damage to sensory, motor, and/or autonomic systems, resulting in permanent pain, numbness, and/or weakness of one or several areas of the body; allergic reactions; (i.e.: anaphylactic reaction); and/or death. Furthermore, the patient was informed of those risks and complications associated with the medications. These include, but are not limited  to: allergic reactions (i.e.: anaphylactic or anaphylactoid reaction(s)); adrenal axis suppression; blood sugar elevation that in diabetics may result in ketoacidosis or comma;  water retention that in patients with history of congestive heart failure may result in shortness of breath, pulmonary edema, and decompensation with resultant heart failure; weight gain; swelling or edema; medication-induced neural toxicity; particulate matter embolism and blood vessel occlusion with resultant organ, and/or nervous system infarction; and/or aseptic necrosis of one or more joints. Finally, the patient was informed that Medicine is not an exact science; therefore, there is also the possibility of unforeseen or unpredictable risks and/or possible complications that may result in a catastrophic outcome. The patient indicated having understood very clearly. We have given the patient no guarantees and we have made no promises. Enough time was given to the patient to ask questions, all of which were answered to the patient's satisfaction. Mr. Nipp has indicated that he wanted to continue with the procedure. Attestation: I, the ordering provider, attest that I have discussed with the patient the benefits, risks, side-effects, alternatives, likelihood of achieving goals, and potential problems during recovery for the procedure that I have provided informed consent. Date  Time:   Pre-Procedure Preparation:  Monitoring: As per clinic protocol. Respiration, ETCO2, SpO2, BP, heart rate and rhythm monitor placed and checked for adequate function Safety Precautions: Patient was assessed for positional comfort and pressure points before starting the procedure. Time-out: I initiated and conducted the "Time-out" before starting the procedure, as per protocol. The patient was asked to participate by confirming the accuracy of the "Time Out" information. Verification of the correct person, site, and procedure were performed and  confirmed by me, the nursing staff, and the patient. "Time-out" conducted as per Joint Commission's Universal Protocol (UP.01.01.01). Time: [id number]  Description of Procedure:       Position: Prone with head of the table was raised to facilitate breathing. Target Area: The interlaminar space, initially targeting the lower laminar border of the superior vertebral body. Approach: Paramedial approach. Area Prepped: Entire Posterior Lumbar Region Prepping solution: ChloraPrep (2% chlorhexidine gluconate and 70% isopropyl alcohol) Safety Precautions: Aspiration looking for blood return was conducted prior to all injections. At no point did we inject any substances, as a needle was being advanced. No attempts were made at seeking any paresthesias. Safe injection practices and needle disposal techniques used. Medications properly checked for expiration dates. SDV (single dose vial) medications used. Description of the Procedure: Protocol guidelines were followed. The procedure needle was introduced through the skin, ipsilateral to the reported pain, and advanced to the target area. Bone was contacted and the needle walked caudad, until the lamina was cleared. The epidural space was identified using "loss-of-resistance technique" with 2-3 ml of PF-NaCl (0.9% NSS), in a 5cc LOR glass syringe. Vitals:   12/28/18 0840 12/28/18 0939 12/28/18 0949 12/28/18 1000  BP: (!) 145/91 (!) 142/84 (!) 143/87 (!) 153/84  Pulse: 65 72 70 68  Resp: 18 17 16 16   Temp: 97.9 F (36.6 C)     TempSrc: Oral     SpO2: 98% 97% 96% 97%  Weight: 227 lb (103 kg)     Height: 6\' 1"  (1.854 m)       Start Time: 0943 hrs. End Time: 0956 hrs. Materials:  Needle(s) Type: Epidural needle Gauge: 17G Length: 3.5-in Medication(s): Please see orders for medications and dosing details. 8 CC solution made of 5 cc of preservative-free saline, 2 cc of 0.2% ropivacaine, 1 cc of Decadron 10 mg/cc Imaging Guidance (Spinal):  Type of Imaging  Technique: Fluoroscopy Guidance (Spinal) Indication(s): Assistance in needle guidance and placement for procedures requiring needle placement in  or near specific anatomical locations not easily accessible without such assistance. Exposure Time: Please see nurses notes. Contrast: Before injecting any contrast, we confirmed that the patient did not have an allergy to iodine, shellfish, or radiological contrast. Once satisfactory needle placement was completed at the desired level, radiological contrast was injected. Contrast injected under live fluoroscopy. No contrast complications. See chart for type and volume of contrast used. Fluoroscopic Guidance: I was personally present during the use of fluoroscopy. "Tunnel Vision Technique" used to obtain the best possible view of the target area. Parallax error corrected before commencing the procedure. "Direction-depth-direction" technique used to introduce the needle under continuous pulsed fluoroscopy. Once target was reached, antero-posterior, oblique, and lateral fluoroscopic projection used confirm needle placement in all planes. Images permanently stored in EMR. Interpretation: I personally interpreted the imaging intraoperatively. Adequate needle placement confirmed in multiple planes. Appropriate spread of contrast into desired area was observed. No evidence of afferent or efferent intravascular uptake. No intrathecal or subarachnoid spread observed. Permanent images saved into the patient's record.  Antibiotic Prophylaxis:   Anti-infectives (From admission, onward)   None     Indication(s): None identified  Post-operative Assessment:  Post-procedure Vital Signs:  Pulse Rate: 68 Temp: 97.9 F (36.6 C) Resp: 16 BP: (!) 153/84 SpO2: 97 %  EBL: None  Complications: No immediate post-treatment complications observed by team, or reported by patient.  Note: The patient tolerated the entire procedure well. A repeat set of vitals were taken  after the procedure and the patient was kept under observation following institutional policy, for this type of procedure. Post-procedural neurological assessment was performed, showing return to baseline, prior to discharge. The patient was provided with post-procedure discharge instructions, including a section on how to identify potential problems. Should any problems arise concerning this procedure, the patient was given instructions to immediately contact us, at any time, without hesitation. In any case, we plan to contact the patient by telephone for a follow-up status report regarding this interventional procedure.  Comments:  No additional relevant information. 5 out of 5 strength bilateral lower extremity: Plantar flexion, dorsiflexion, knee flexion, knee extension.  Plan of Care    Imaging Orders     DG C-Arm 1-60 Min-No Report Procedure Orders    No procedure(s) ordered today    Medications ordered for procedure: Meds ordered this encounter  Medications  . sodium chloride flush (NS) 0.9 % injection 2 mL  . ropivacaine (PF) 2 mg/mL (0.2%) (NAROPIN) injection 2 mL  . dexamethasone (DECADRON) injection 10 mg  . iopamidol (ISOVUE-M) 41 % intrathecal injection 10 mL    Must be Myelogram-compatible. If not available, you may substitute with a water-soluble, non-ionic, hypoallergenic, myelogram-compatible radiological contrast medium.   Medications administered: We administered sodium chloride flush, ropivacaine (PF) 2 mg/mL (0.2%), dexamethasone, and iopamidol.  See the medical record for exact dosing, route, and time of administration.  New Prescriptions   No medications on file   Disposition: Discharge home  Discharge Date & Time: 12/28/2018; 1002 hrs.   Physician-requested Follow-up: Return in about 4 weeks (around 01/25/2019).  Future Appointments  Date Time Provider [place]  01/24/2019  9:30 AM [name], MD [facility] None   Primary Care Physician: Mikey College, NP Location: [facility] Note by: [name], MD Date: 12/28/2018; Time: 11:07 AM  Disclaimer:  Medicine is not an exact science. The only guarantee in medicine is that nothing is guaranteed. It is important to note that the decision to  proceed with this intervention was based on the information collected from the patient. The Data and conclusions were drawn from the patient's questionnaire, the interview, and the physical examination. Because the information was provided in large part by the patient, it cannot be guaranteed that it has not been purposely or unconsciously manipulated. Every effort has been made to obtain as much relevant data as possible for this evaluation. It is important to note that the conclusions that lead to this procedure are derived in large part from the available data. Always take into account that the treatment will also be dependent on availability of resources and existing treatment guidelines, considered by other Pain Management Practitioners as being common knowledge and practice, at the time of the intervention. For Medico-Legal purposes, it is also important to point out that variation in procedural techniques and pharmacological choices are the acceptable norm. The indications, contraindications, technique, and results of the above procedure should only be interpreted and judged by a Board-Certified Interventional Pain Specialist with extensive familiarity and expertise in the same exact procedure and technique.

## 2018-12-28 NOTE — Progress Notes (Signed)
Safety precautions to be maintained throughout the outpatient stay will include: orient to surroundings, keep bed in low position, maintain call bell within reach at all times, provide assistance with transfer out of bed and ambulation.  

## 2018-12-29 ENCOUNTER — Telehealth: Payer: Self-pay

## 2018-12-29 NOTE — Telephone Encounter (Signed)
Post procedure phone call.  Spoke with patient and he states he is doing well except that he has had some rectal bleeding.  Patient states that he has had this before.  Instructed patient that if he continued to have any rectal bleeding to see his PCP or go to the ED.

## 2019-01-03 LAB — TOXASSURE SELECT 13 (MW), URINE

## 2019-01-23 ENCOUNTER — Ambulatory Visit (INDEPENDENT_AMBULATORY_CARE_PROVIDER_SITE_OTHER): Payer: No Typology Code available for payment source | Admitting: Nurse Practitioner

## 2019-01-23 ENCOUNTER — Other Ambulatory Visit: Payer: Self-pay

## 2019-01-23 ENCOUNTER — Encounter: Payer: Self-pay | Admitting: Nurse Practitioner

## 2019-01-23 DIAGNOSIS — G5631 Lesion of radial nerve, right upper limb: Secondary | ICD-10-CM | POA: Diagnosis not present

## 2019-01-23 MED ORDER — TIZANIDINE HCL 4 MG PO TABS: 4.0000 mg | ORAL_TABLET | Freq: Three times a day (TID) | ORAL | 1 refills | Status: DC | PRN

## 2019-01-23 MED ORDER — DICLOFENAC SODIUM 75 MG PO TBEC: 75.0000 mg | DELAYED_RELEASE_TABLET | Freq: Two times a day (BID) | ORAL | 0 refills | Status: DC | PRN

## 2019-01-23 NOTE — Progress Notes (Signed)
Telemedicine Encounter: Disclosed to patient at start of encounter that we will provide appropriate telemedicine services.  Patient consents to be treated via phone prior to discussion. - Patient is at his home and is accessed via telephone. - Services are provided by Cassell Smiles from [facility].  Subjective:    Patient ID: Anthony Nguyen, male    DOB: 1956/03/01, 63 y.o.   MRN: [id number]  Anthony Nguyen is a 63 y.o. male presenting on 01/23/2019 for Hand Pain (Right hand numbness in the middle finger, pointer finger and thumb. The patient states he woke up with a numbness sensation in the three fingers. He contributes it to an back injection x 3weeks )  HPI RIGHT hand numbness Right hand numbness is new this morning when waking up.  It is beginning to improve today.  Starting to have return of sensation of palm as day is continuing. Neck pain history is present with prior cervical disc surgery.  Patient continues to have mild cervical spine pain.  Right groin pain Medial right leg near groin has sharp pain there, but only occasionally when trying to walk.  Had steroid injections of low back with pain management about 3 weeks ago.  Pain here started after injections and is only in place for last 3-4 days. Patient continues to be regularly active at work.  Social History   Tobacco Use  . Smoking status: Current Every Day Smoker    Packs/day: 1.00    Years: 30.00    Pack years: 30.00    Types: Cigarettes  . Smokeless tobacco: Never Used  Substance Use Topics  . Alcohol use: No    Frequency: Never  . Drug use: No    Review of Systems Per HPI unless specifically indicated above     Objective:    There were no vitals taken for this visit.  Wt Readings from Last 3 Encounters:  12/28/18 227 lb (103 kg)  12/06/18 227 lb (103 kg)  11/25/18 227 lb 3.2 oz (103.1 kg)    Physical Exam Patient remotely monitored.  Verbal communication appropriate.   Cognition normal. Patient performed Phalen sign at home and is negative for pain today.  Results for orders placed or performed in visit on 12/28/18  ToxASSURE Select 13 (MW), Urine  Result Value Ref Range   Summary FINAL       Assessment & Plan:   Problem List Items Addressed This Visit    None    Visit Diagnoses    Radial nerve compression, right    -  Primary   Relevant Medications   tiZANidine (ZANAFLEX) 4 MG tablet   diclofenac (VOLTAREN) 75 MG EC tablet    Pain likely self-limited complication of nerve impingement.  Cannot fully exclude cervical origin of nerve compression, but unlikely since numbness is not affecting area above wrist. Possible carpal tunnel syndrome present.  Groin muscle strain possible complicated by return to more normal gait after spinal injections.  Plan:  1. Treat with OTC pain meds (acetaminophen) and Rx diclofenac 75 mg bid x 14 days.  Discussed alternate dosing and max dosing. - May resume tizanidine for spinal muscle hypertonicity/pain (chronic). 2. Apply heat and/or ice to affected area. 3. May also apply a muscle rub with lidocaine or lidocaine patch after heat or ice. 4. Consider OT or follow-up with pain management.  May need orthopedic referral if numbness continues. 5. Follow up prn 1-2 weeks.   Meds ordered this encounter  Medications  .  tiZANidine (ZANAFLEX) 4 MG tablet    Sig: Take 1 tablet (4 mg total) by mouth every 8 (eight) hours as needed for muscle spasms.    Dispense:  60 tablet    Refill:  1    Order Specific Question:   Supervising Provider    Answer:   Olin Hauser [[id number]]  . diclofenac (VOLTAREN) 75 MG EC tablet    Sig: Take 1 tablet (75 mg total) by mouth 2 (two) times daily as needed for moderate pain.    Dispense:  30 tablet    Refill:  0    Order Specific Question:   Supervising Provider    Answer:   Olin Hauser [[id number]]   - Time spent in direct consultation with patient via telemedicine about  above concerns: 11 minutes  Follow up plan: Follow-up prn 1-2  Weeks if not improving.  Cassell Smiles, DNP, AGPCNP-BC Adult Gerontology Primary Care Nurse Practitioner [facility] 01/23/2019, 3:01 PM

## 2019-01-24 ENCOUNTER — Ambulatory Visit
Payer: Medicare (Managed Care) | Attending: Student in an Organized Health Care Education/Training Program | Admitting: Student in an Organized Health Care Education/Training Program

## 2019-01-24 DIAGNOSIS — M5416 Radiculopathy, lumbar region: Secondary | ICD-10-CM | POA: Diagnosis not present

## 2019-01-24 DIAGNOSIS — M25562 Pain in left knee: Secondary | ICD-10-CM

## 2019-01-24 DIAGNOSIS — Z96652 Presence of left artificial knee joint: Secondary | ICD-10-CM

## 2019-01-24 DIAGNOSIS — G894 Chronic pain syndrome: Secondary | ICD-10-CM

## 2019-01-24 DIAGNOSIS — M5136 Other intervertebral disc degeneration, lumbar region: Secondary | ICD-10-CM | POA: Diagnosis not present

## 2019-01-24 DIAGNOSIS — M48061 Spinal stenosis, lumbar region without neurogenic claudication: Secondary | ICD-10-CM

## 2019-01-24 DIAGNOSIS — G8929 Other chronic pain: Secondary | ICD-10-CM

## 2019-01-24 DIAGNOSIS — S32009D Unspecified fracture of unspecified lumbar vertebra, subsequent encounter for fracture with routine healing: Secondary | ICD-10-CM | POA: Diagnosis not present

## 2019-01-24 DIAGNOSIS — M170 Bilateral primary osteoarthritis of knee: Secondary | ICD-10-CM

## 2019-01-24 MED ORDER — OXYCODONE HCL 10 MG PO TABS: 10.0000 mg | ORAL_TABLET | Freq: Three times a day (TID) | ORAL | 0 refills | Status: DC | PRN

## 2019-01-24 MED ORDER — OXYCODONE HCL 10 MG PO TABS: 10.0000 mg | ORAL_TABLET | Freq: Three times a day (TID) | ORAL | 0 refills | Status: AC | PRN

## 2019-01-24 NOTE — Progress Notes (Signed)
Pain Management Encounter Note - Virtual Visit via Telephone Telehealth (real-time audio visits between healthcare provider and patient).  Patient's Phone No. & Preferred Pharmacy:  [phone] (home); There is no such number on file (mobile).; (Preferred) [phone]  [name] - [name], [name] [address] Phone: [phone] Fax: [name], [name] [address] [place] [address] Phone: [phone] Fax: [phone]   Pre-screening note:  Our staff contacted Anthony Nguyen and offered him an "in person", "face-to-face" appointment versus a telephone encounter. He indicated preferring the telephone encounter, at this time.  Reason for Virtual Visit: COVID-19*  Social distancing based on CDC and AMA recommendations.   I contacted Anthony Nguyen on 01/24/2019 at 9:49 AM by telephone and clearly identified myself as Anthony Santa, MD. I verified that I was speaking with the correct person using two identifiers (Name and date of birth: 06/16/1956).  Advanced Informed Consent I sought verbal advanced consent from [name] for telemedicine interactions and virtual visit. I informed Anthony Nguyen of the security and privacy concerns, risks, and limitations associated with performing an evaluation and management service by telephone. I also informed Anthony Nguyen of the availability of "in person" appointments and I informed him of the possibility of a patient responsible charge related to this service. Anthony Nguyen expressed understanding and agreed to proceed.   Historic Elements   Anthony Nguyen is a 63 y.o. year old, male patient evaluated today after his last encounter by our practice on 12/29/2018. Anthony Nguyen  has a past medical history of Allergy, Arthritis, Cancer ([place]), Chronic back pain, Current every day smoker, Diverticulosis, GERD (gastroesophageal  reflux disease), H/O diverticulitis of colon, Restless leg syndrome, and Sciatic pain. 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 [name] has a current medication list which includes the following prescription(s): diclofenac, diclofenac sodium, lisinopril, oxycodone hcl, oxycodone hcl, and tizanidine. He  reports that he has been smoking cigarettes. He has a 30.00 pack-year smoking history. He has never used smokeless tobacco. He reports that he does not drink alcohol or use drugs. Anthony Nguyen has No Known Allergies.   HPI  I last saw him on 12/28/2018. He is being evaluated for both, medication management and a post-procedure assessment.  Post-Procedure Evaluation  Procedure: Right L4-L5 ESI #3 Pre-procedure pain level:  6/10 Post-procedure: 0/10          Sedation: Please see nurses note.  Effectiveness during initial hour after procedure(Ultra-Short Term Relief):100 %  Local anesthetic used: Long-acting (4-6 hours) Effectiveness: Defined as any analgesic benefit obtained secondary to the administration of local anesthetics. This carries significant diagnostic value as to the etiological location, or anatomical origin, of the pain. Duration of benefit is expected to coincide with the duration of the local anesthetic used.  Effectiveness during initial 4-6 hours after procedure(Short-Term Relief):100%  Long-term benefit: Defined as any relief past the pharmacologic duration of the local anesthetics.  Effectiveness past the initial 6 hours after procedure(Long-Term Relief): 60 %  Current benefits: Defined as benefit that persist at this time.   Analgesia:  50% improved Function: Somewhat improved ROM: Somewhat improved  Pharmacotherapy Assessment   01/05/2019  1   12/06/2018  Oxycodone Hcl 10 MG Tablet  75.00 25 Bi Lat   [id number]  [place]  (1669)   0  45.00 MME  Comm Ins   [place]    Monitoring: Pharmacotherapy: No side-effects or adverse reactions reported. [place] PMP: PDMP reviewed during this encounter.       Compliance: No problems identified. Plan: Refer to "POC".  Review of recent tests  DG C-Arm 1-60 Min-No Report Fluoroscopy was utilized by the requesting physician.  No radiographic  interpretation.    Procedure visit on 12/28/2018  Component Date Value Ref Range Status  . Summary 12/28/2018 FINAL   Final   Comment: ==================================================================== TOXASSURE SELECT 13 (MW) ==================================================================== Test                             Result       Flag       Units Drug Present and Declared for Prescription Verification   Oxycodone                      314          EXPECTED   ng/mg creat   Oxymorphone                    331          EXPECTED   ng/mg creat   Noroxycodone                   1838         EXPECTED   ng/mg creat   Noroxymorphone                 130          EXPECTED   ng/mg creat    Sources of oxycodone are scheduled prescription medications.    Oxymorphone, noroxycodone, and noroxymorphone are expected    metabolites of oxycodone. Oxymorphone is also available as a    scheduled prescription medication. ==================================================================== Test                      Result    Flag   Units      Ref Range   Creatinine              102              mg/dL      >=20 ======                          ============================================================== Declared Medications:  The flagging and interpretation on this report are based on the  following declared medications.  Unexpected results may arise from  inaccuracies in the declared medications.  **Note: The testing scope of this panel includes these medications:  Oxycodone  **Note: The testing scope of this panel does not include following   reported medications:  Diclofenac  Lisinopril  Melatonin ==================================================================== For clinical consultation, please call [phone]. ====================================================================    Assessment  The primary encounter diagnosis was History of left knee replacement. Diagnoses of Primary osteoarthritis of both knees, Closed fracture of transverse process of lumbar vertebra with routine healing, subsequent encounter, Lumbar radiculopathy, Lumbar degenerative disc disease, Chronic pain syndrome, Lumbar foraminal stenosis, and Chronic pain of left knee were also pertinent to this visit.  Plan of Care  I have changed Fadil L. Krolak's Oxycodone HCl. I am also having him start on Oxycodone HCl. Additionally, I am having him maintain his Diclofenac Sodium,  lisinopril, tiZANidine, and diclofenac.  Telephone encounter for postprocedural evaluation after right L4-L5 ESI #3 performed on 12/28/2018.  Patient endorses significant pain relief for the first week after the epidural with improvement in his gait.  Current analgesic benefit rated as approximately 40 to 50%.  Patient continues to endorse significant pain that radiates down his legs when he bends over.  Furthermore he also endorses pain along the posterior aspect of his left knee.  Discussed left knee genicular nerve block.  Risks and benefits reviewed and will order procedure, schedule pending COVID-19 elective surgery restrictions.  In regards to medication management, patient states that he is taking his medications as prescribed.  They are helping him function and also providing him with analgesic benefit.  We will refill for 2 months as below.  Fill dates of 02/04/2019 and 03/05/2019.  Patient to follow-up in mid June for medication management and hopefully left knee genicular nerve block at that time when the COVID-19 situation has improved.  Patient instructed to continue  tizanidine 4 mg 3 times daily as needed and diclofenac 75 mg twice daily PRN.   Pharmacotherapy (Medications Ordered): Meds ordered this encounter  Medications  . Oxycodone HCl 10 MG TABS    Sig: Take 1 tablet (10 mg total) by mouth every 8 (eight) hours as needed for up to 30 days. Max 75/month    Dispense:  75 tablet    Refill:  0    Do not place this medication, or any other prescription from our practice, on "Automatic Refill". Patient may have prescription filled one day early if pharmacy is closed on scheduled refill date. Max 75/month  . Oxycodone HCl 10 MG TABS    Sig: Take 1 tablet (10 mg total) by mouth every 8 (eight) hours as needed for up to 30 days. Must last 30 days. Max 75/month    Dispense:  75 tablet    Refill:  0    [place] STOP ACT - Not applicable. Fill one day early if pharmacy is closed on scheduled refill date.   Orders:  Orders Placed This Encounter  Procedures  . GENICULAR NERVE BLOCK    Indication(s):  Sub-acute knee pain    Standing Status:   Future    Standing Expiration Date:   02/23/2019    Scheduling Instructions:     Side: Left-sided     Sedation: With Sedation.     Timeframe: ASAA    Order Specific Question:   Where will this procedure be performed?    Answer:   [facility]   Follow-up plan:   Return in about 9 weeks (around 03/28/2019) for Medication Management.   I discussed the assessment and treatment plan with the patient. The patient was provided an opportunity to ask questions and all were answered. The patient agreed with the plan and demonstrated an understanding of the instructions.  Patient advised to call back or seek an in-person evaluation if the symptoms or condition worsens.  Total duration of non-face-to-face encounter: 21 minutes.  Note by: Anthony Santa, MD Date: 01/24/2019; Time: 9:49 AM  Disclaimer:  * Given the special circumstances of the COVID-19 pandemic, the federal government has announced that the Office for Civil  Rights (OCR) will exercise its enforcement discretion and will not impose penalties on physicians using telehealth in the event of noncompliance with regulatory requirements under the [place] and [place] (HIPAA) in connection with the good faith provision of telehealth during the JMEQA-83 national public health  emergency. (AMA)

## 2019-02-09 ENCOUNTER — Emergency Department: Payer: PRIVATE HEALTH INSURANCE

## 2019-02-09 ENCOUNTER — Encounter: Payer: Self-pay | Admitting: Emergency Medicine

## 2019-02-09 ENCOUNTER — Emergency Department
Admission: EM | Admit: 2019-02-09 | Discharge: 2019-02-09 | Disposition: A | Discharge status: Home / Self Care | Payer: PRIVATE HEALTH INSURANCE | Attending: Student in an Organized Health Care Education/Training Program | Admitting: Student in an Organized Health Care Education/Training Program

## 2019-02-09 ENCOUNTER — Other Ambulatory Visit: Payer: Self-pay

## 2019-02-09 DIAGNOSIS — R1011 Right upper quadrant pain: Secondary | ICD-10-CM | POA: Diagnosis not present

## 2019-02-09 DIAGNOSIS — I10 Essential (primary) hypertension: Secondary | ICD-10-CM | POA: Diagnosis not present

## 2019-02-09 DIAGNOSIS — F17210 Nicotine dependence, cigarettes, uncomplicated: Secondary | ICD-10-CM | POA: Insufficient documentation

## 2019-02-09 DIAGNOSIS — R05 Cough: Secondary | ICD-10-CM | POA: Insufficient documentation

## 2019-02-09 DIAGNOSIS — Z20828 Contact with and (suspected) exposure to other viral communicable diseases: Secondary | ICD-10-CM | POA: Insufficient documentation

## 2019-02-09 DIAGNOSIS — R112 Nausea with vomiting, unspecified: Secondary | ICD-10-CM | POA: Insufficient documentation

## 2019-02-09 DIAGNOSIS — R059 Cough, unspecified: Secondary | ICD-10-CM

## 2019-02-09 DIAGNOSIS — R509 Fever, unspecified: Secondary | ICD-10-CM | POA: Diagnosis present

## 2019-02-09 LAB — COMPREHENSIVE METABOLIC PANEL
ALT: 22 U/L (ref 0–44)
AST: 23 U/L (ref 15–41)
Albumin: 4.8 g/dL (ref 3.5–5.0)
Alkaline Phosphatase: 76 U/L (ref 38–126)
Anion gap: 8 (ref 5–15)
BUN: 19 mg/dL (ref 6–20)
CO2: 25 mmol/L (ref 22–32)
Calcium: 9.6 mg/dL (ref 8.9–10.3)
Chloride: 105 mmol/L (ref 98–111)
Creatinine, Ser: 1.12 mg/dL (ref 0.61–1.24)
GFR calc Af Amer: >60 mL/min (ref >60)
GFR calc non Af Amer: >60 mL/min (ref >60)
Glucose, Bld: 136 mg/dL — ABNORMAL HIGH (ref 70–99)
Potassium: 4.3 mmol/L (ref 3.5–5.1)
Sodium: 138 mmol/L (ref 135–145)
Total Bilirubin: 1 mg/dL (ref 0.3–1.2)
Total Protein: 8.2 g/dL — ABNORMAL HIGH (ref 6.5–8.1)

## 2019-02-09 LAB — CBC WITH DIFFERENTIAL/PLATELET
Abs Immature Granulocytes: 0.05 10*3/uL (ref 0.00–0.07)
Basophils Absolute: 0.1 10*3/uL (ref 0.0–0.1)
Basophils Relative: 1 %
Eosinophils Absolute: 0.1 10*3/uL (ref 0.0–0.5)
Eosinophils Relative: 0 %
HCT: 47 % (ref 39.0–52.0)
Hemoglobin: 15.4 g/dL (ref 13.0–17.0)
Immature Granulocytes: 0 %
Lymphocytes Relative: 13 %
Lymphs Abs: 1.8 10*3/uL (ref 0.7–4.0)
MCH: 29.8 pg (ref 26.0–34.0)
MCHC: 32.8 g/dL (ref 30.0–36.0)
MCV: 90.9 fL (ref 80.0–100.0)
Monocytes Absolute: 0.8 10*3/uL (ref 0.1–1.0)
Monocytes Relative: 6 %
Neutro Abs: 10.5 10*3/uL — ABNORMAL HIGH (ref 1.7–7.7)
Neutrophils Relative %: 80 %
Platelets: 212 10*3/uL (ref 150–400)
RBC: 5.17 MIL/uL (ref 4.22–5.81)
RDW: 12.9 % (ref 11.5–15.5)
WBC: 13.3 10*3/uL — ABNORMAL HIGH (ref 4.0–10.5)
nRBC: 0 % (ref 0.0–0.2)

## 2019-02-09 LAB — TROPONIN I: Troponin I: <0.03 ng/mL (ref <0.03)

## 2019-02-09 LAB — LIPASE, BLOOD: Lipase: 24 U/L (ref 11–51)

## 2019-02-09 MED ORDER — DOXYCYCLINE HYCLATE 100 MG PO TABS: 100.0000 mg | ORAL_TABLET | Freq: Two times a day (BID) | ORAL | 0 refills | Status: AC

## 2019-02-09 MED ORDER — ALBUTEROL SULFATE HFA 108 (90 BASE) MCG/ACT IN AERS: 2.0000 | INHALATION_SPRAY | Freq: Four times a day (QID) | RESPIRATORY_TRACT | 2 refills | Status: DC | PRN

## 2019-02-09 MED ORDER — ONDANSETRON HCL 4 MG PO TABS: 4.0000 mg | ORAL_TABLET | Freq: Every day | ORAL | 0 refills | Status: DC | PRN

## 2019-02-09 MED ORDER — ONDANSETRON HCL 4 MG/2ML IJ SOLN: 4.0000 mg | Freq: Once | INTRAMUSCULAR | Status: DC

## 2019-02-09 NOTE — ED Notes (Signed)
Pt c/o headache, cough and SOB xfew days. PT states also n/v and chills xfew days. SPeech clear, NAD noted,

## 2019-02-09 NOTE — ED Provider Notes (Signed)
[facility] Emergency Department Provider Note    First MD Initiated Contact with Patient 02/09/19 1731     (approximate)  I have reviewed the triage vital signs and the nursing notes.   HISTORY  Chief Complaint Emesis    HPI Anthony Nguyen is a 63 y.o. male extensive history as listed below presents the ER for evaluation of 1 week of fever chills nausea vomiting with cough and right upper quadrant abdominal pain.  Is never had symptoms like this before.  States that the fevers are worse at night.  Not been on any antibiotics.  Denies any dysuria.  No diarrhea.  No lower abdominal pain.  Does smoke.  No recent known sick contacts.    Past Medical History:  Diagnosis Date  . Allergy   . Arthritis    osteoarthritis  . Cancer ([place])    melanoma skin cancer face  . Chronic back pain    goes to Dr. Holley Raring at the pain clinic  . Current every day smoker   . Diverticulosis   . GERD (gastroesophageal reflux disease)   . H/O diverticulitis of colon   . Restless leg syndrome   . Sciatic pain    Family History  Problem Relation Age of Onset  . Hypertension Mother   . Alzheimer's disease Father   . Prostate cancer Father 55  . Cancer Sister 15       breast cancer  . Diabetes Sister   . Heart disease Brother        electrical pathways - ongoing workup  . Healthy Brother   . Healthy Son   . Healthy Son   . Healthy Son   . Healthy Son    Past Surgical History:  Procedure Laterality Date  . JOINT REPLACEMENT Left 10/14/2018   knee  . skin cancer removal     face  . TONSILLECTOMY AND ADENOIDECTOMY    . TOTAL KNEE ARTHROPLASTY Left 10/14/2017   Procedure: TOTAL KNEE ARTHROPLASTY;  Surgeon: Hessie Knows, MD;  Location: [facility] OR[facility];  Service: Orthopedics;  Laterality: Left;   Patient Active Problem List   Diagnosis Date Noted  . Essential hypertension 11/28/2018  . Benign prostatic hyperplasia with urinary frequency 04/04/2018  . Lumbar  radiculopathy 12/29/2017  . Lumbar degenerative disc disease 12/29/2017  . Lumbar foraminal stenosis 12/29/2017  . History of left knee replacement 12/29/2017  . Primary osteoarthritis of both knees 12/29/2017  . Chronic pain syndrome 12/29/2017  . Primary osteoarthritis of left knee 10/14/2017  . Chronic nasal congestion 11/24/2013  . Benign paroxysmal positional vertigo 11/24/2013  . Sleep disturbance 08/17/2013  . Allergy 08/17/2013  . GERD (gastroesophageal reflux disease) 08/17/2013  . Tobacco abuse counseling 08/17/2013  . Ringworm 08/17/2013      Prior to Admission medications   Medication Sig Start Date End Date Taking? Authorizing Provider  albuterol (VENTOLIN HFA) 108 (90 Base) MCG/ACT inhaler Inhale 2 puffs into the lungs every 6 (six) hours as needed for wheezing or shortness of breath. 02/09/19   Merlyn Lot, MD  diclofenac (VOLTAREN) 75 MG EC tablet Take 1 tablet (75 mg total) by mouth 2 (two) times daily as needed for moderate pain. 01/23/19   Mikey College, NP  Diclofenac Sodium 3 % GEL Place 1 application onto the skin 3 (three) times daily as needed (left knee pain). 03/10/18   Mikey College, NP  doxycycline (VIBRA-TABS) 100 MG tablet Take 1 tablet (100 mg total) by mouth 2 (two)  times daily for 7 days. 02/09/19 02/16/19  Merlyn Lot, MD  lisinopril (PRINIVIL,ZESTRIL) 10 MG tablet Take 1 tablet (10 mg total) by mouth daily. 11/28/18   Mikey College, NP  ondansetron (ZOFRAN) 4 MG tablet Take 1 tablet (4 mg total) by mouth daily as needed for nausea or vomiting. 02/09/19 02/09/20  Merlyn Lot, MD  Oxycodone HCl 10 MG TABS Take 1 tablet (10 mg total) by mouth every 8 (eight) hours as needed for up to 30 days. Max 75/month 02/04/19 03/06/19  [name], MD  Oxycodone HCl 10 MG TABS Take 1 tablet (10 mg total) by mouth every 8 (eight) hours as needed for up to 30 days. Must last 30 days. Max 75/month 03/05/19 04/04/19  [name], MD   tiZANidine (ZANAFLEX) 4 MG tablet Take 1 tablet (4 mg total) by mouth every 8 (eight) hours as needed for muscle spasms. 01/23/19   Mikey College, NP    Allergies Patient has no known allergies.    Social History Social History   Tobacco Use  . Smoking status: Current Every Day Smoker    Packs/day: 1.00    Years: 30.00    Pack years: 30.00    Types: Cigarettes  . Smokeless tobacco: Never Used  Substance Use Topics  . Alcohol use: No    Frequency: Never  . Drug use: No    Review of Systems Patient denies headaches, rhinorrhea, blurry vision, numbness, shortness of breath, chest pain, edema, cough, abdominal pain, nausea, vomiting, diarrhea, dysuria, fevers, rashes or hallucinations unless otherwise stated above in HPI. ____________________________________________   PHYSICAL EXAM:  VITAL SIGNS: Vitals:   02/09/19 1718  BP: (!) 141/78  Pulse: 92  Resp: 16  Temp: 98.2 F (36.8 C)  SpO2: 96%    Constitutional: Alert and oriented.  Eyes: Conjunctivae are normal.  Head: Atraumatic. Nose: No congestion/rhinnorhea. Mouth/Throat: Mucous membranes are moist.   Neck: No stridor. Painless ROM.  Cardiovascular: Normal rate, regular rhythm. Grossly normal heart sounds.  Good peripheral circulation. Respiratory: Normal respiratory effort.  No retractions. Lungs CTAB. Gastrointestinal: Soft but with ttp in ruq. No distention. No abdominal bruits. No CVA tenderness. Genitourinary:  Musculoskeletal: No lower extremity tenderness nor edema.  No joint effusions. Neurologic:  Normal speech and language. No gross focal neurologic deficits are appreciated. No facial droop Skin:  Skin is warm, dry and intact. No rash noted. Psychiatric: Mood and affect are normal. Speech and behavior are normal.  ____________________________________________   LABS (all labs ordered are listed, but only abnormal results are displayed)  Results for orders placed or performed during the  hospital encounter of 02/09/19 (from the past 24 hour(s))  Comprehensive metabolic panel     Status: Abnormal   Collection Time: 02/09/19  5:32 PM  Result Value Ref Range   Sodium 138 135 - 145 mmol/L   Potassium 4.3 3.5 - 5.1 mmol/L   Chloride 105 98 - 111 mmol/L   CO2 25 22 - 32 mmol/L   Glucose, Bld 136 (H) 70 - 99 mg/dL   BUN 19 6 - 20 mg/dL   Creatinine, Ser 1.12 0.61 - 1.24 mg/dL   Calcium 9.6 8.9 - 10.3 mg/dL   Total Protein 8.2 (H) 6.5 - 8.1 g/dL   Albumin 4.8 3.5 - 5.0 g/dL   AST 23 15 - 41 U/L   ALT 22 0 - 44 U/L   Alkaline Phosphatase 76 38 - 126 U/L   Total Bilirubin 1.0 0.3 - 1.2 mg/dL  GFR calc non Af Amer >60 >60 mL/min   GFR calc Af Amer >60 >60 mL/min   Anion gap 8 5 - 15  CBC with Differential     Status: Abnormal   Collection Time: 02/09/19  5:32 PM  Result Value Ref Range   WBC 13.3 (H) 4.0 - 10.5 K/uL   RBC 5.17 4.22 - 5.81 MIL/uL   Hemoglobin 15.4 13.0 - 17.0 g/dL   HCT 47.0 39.0 - 52.0 %   MCV 90.9 80.0 - 100.0 fL   MCH 29.8 26.0 - 34.0 pg   MCHC 32.8 30.0 - 36.0 g/dL   RDW 12.9 11.5 - 15.5 %   Platelets 212 150 - 400 K/uL   nRBC 0.0 0.0 - 0.2 %   Neutrophils Relative % 80 %   Neutro Abs 10.5 (H) 1.7 - 7.7 K/uL   Lymphocytes Relative 13 %   Lymphs Abs 1.8 0.7 - 4.0 K/uL   Monocytes Relative 6 %   Monocytes Absolute 0.8 0.1 - 1.0 K/uL   Eosinophils Relative 0 %   Eosinophils Absolute 0.1 0.0 - 0.5 K/uL   Basophils Relative 1 %   Basophils Absolute 0.1 0.0 - 0.1 K/uL   Immature Granulocytes 0 %   Abs Immature Granulocytes 0.05 0.00 - 0.07 K/uL  Lipase, blood     Status: None   Collection Time: 02/09/19  5:32 PM  Result Value Ref Range   Lipase 24 11 - 51 U/L  Troponin I -     Status: None   Collection Time: 02/09/19  5:32 PM  Result Value Ref Range   Troponin I <0.03 <0.03 ng/mL   ____________________________________________  EKG My review and personal interpretation at Time: 21:13   Indication: ruq pain  Rate: 75  Rhythm: sinus Axis:  normal Other: normal intervals, no stemi ____________________________________________  RADIOLOGY  I personally reviewed all radiographic images ordered to evaluate for the above acute complaints and reviewed radiology reports and findings.  These findings were personally discussed with the patient.  Please see medical record for radiology report.  ____________________________________________   PROCEDURES  Procedure(s) performed:  Procedures    Critical Care performed: no ____________________________________________   INITIAL IMPRESSION / ASSESSMENT AND PLAN / ED COURSE  Pertinent labs & imaging results that were available during my care of the patient were reviewed by me and considered in my medical decision making (see chart for details).   DDX: uri, pna, cholelithiasis, cholecystitis, colitis, enteritis, covid19  Anthony Nguyen is a 63 y.o. who presents to the ED with symptoms as described above.  Patient nontoxic-appearing.  Has been having frequent productive cough.  He is a smoker.  No respiratory distress.  Blood work and ultrasound as well as x-ray will be sent for the by differential.  Will provide antiemetic.    Clinical Course as of Feb 08 2121  Thu Feb 09, 2019  2109 Blood work thus far is reassuring.  Doubt ACS given normal troponin several days after onset of symptoms.  Not consistent with PE cholelithiasis or cholecystitis.  He is afebrile and otherwise hemodynamically stable.   [PR]    Clinical Course User Index [PR] Merlyn Lot, MD    The patient was evaluated in Emergency Department today for the symptoms described in the history of present illness. He/she was evaluated in the context of the global COVID-19 pandemic, which necessitated consideration that the patient might be at risk for infection with the SARS-CoV-2 virus that causes COVID-19. Institutional protocols and  algorithms that pertain to the evaluation of patients at risk for COVID-19 are in  a state of rapid change based on information released by regulatory bodies including the CDC and federal and state organizations. These policies and algorithms were followed during the patient's care in the ED.  As part of my medical decision making, I reviewed the following data within the [place] notes reviewed and incorporated, Labs reviewed, notes from prior ED visits and [place] Controlled Substance Database   ____________________________________________   FINAL CLINICAL IMPRESSION(S) / ED DIAGNOSES  Final diagnoses:  RUQ pain  Non-intractable vomiting with nausea, unspecified vomiting type  Cough      NEW MEDICATIONS STARTED DURING THIS VISIT:  New Prescriptions   ALBUTEROL (VENTOLIN HFA) 108 (90 BASE) MCG/ACT INHALER    Inhale 2 puffs into the lungs every 6 (six) hours as needed for wheezing or shortness of breath.   DOXYCYCLINE (VIBRA-TABS) 100 MG TABLET    Take 1 tablet (100 mg total) by mouth 2 (two) times daily for 7 days.   ONDANSETRON (ZOFRAN) 4 MG TABLET    Take 1 tablet (4 mg total) by mouth daily as needed for nausea or vomiting.     Note:  This document was prepared using Dragon voice recognition software and may include unintentional dictation errors.    Merlyn Lot, MD 02/09/19 2122

## 2019-02-09 NOTE — Discharge Instructions (Signed)

## 2019-02-09 NOTE — ED Triage Notes (Signed)
Pt in via POV, reports intermittent N/V, chills/sweats over the last week.  Pt afebrile upon arrival, NAD noted at this time.

## 2019-02-11 LAB — NOVEL CORONAVIRUS, NAA (HOSP ORDER, SEND-OUT TO REF LAB; TAT 18-24 HRS): SARS-CoV-2, NAA: NOT DETECTED

## 2019-02-13 ENCOUNTER — Telehealth: Payer: Self-pay | Admitting: Emergency Medicine

## 2019-02-13 NOTE — Telephone Encounter (Signed)
Called patient to inform of negative covid 19 test result.  No answer.

## 2019-02-13 NOTE — Telephone Encounter (Signed)
Patient called me back and I gave him result of covid 19 test.  He says he is still having symptoms and only slightly improved.  I explained that he should follow up with pcp.

## 2019-02-28 ENCOUNTER — Emergency Department
Admission: EM | Admit: 2019-02-28 | Discharge: 2019-02-28 | Disposition: A | Discharge status: Home / Self Care | Payer: PRIVATE HEALTH INSURANCE | Attending: Emergency Medicine | Admitting: Emergency Medicine

## 2019-02-28 ENCOUNTER — Emergency Department: Payer: PRIVATE HEALTH INSURANCE

## 2019-02-28 ENCOUNTER — Encounter: Payer: Self-pay | Admitting: *Deleted

## 2019-02-28 ENCOUNTER — Ambulatory Visit (INDEPENDENT_AMBULATORY_CARE_PROVIDER_SITE_OTHER): Payer: No Typology Code available for payment source | Admitting: Family Medicine

## 2019-02-28 ENCOUNTER — Encounter: Payer: Self-pay | Admitting: Family Medicine

## 2019-02-28 ENCOUNTER — Other Ambulatory Visit: Payer: Self-pay

## 2019-02-28 DIAGNOSIS — R058 Other specified cough: Secondary | ICD-10-CM

## 2019-02-28 DIAGNOSIS — R059 Cough, unspecified: Secondary | ICD-10-CM

## 2019-02-28 DIAGNOSIS — R42 Dizziness and giddiness: Secondary | ICD-10-CM

## 2019-02-28 DIAGNOSIS — I10 Essential (primary) hypertension: Secondary | ICD-10-CM | POA: Diagnosis not present

## 2019-02-28 DIAGNOSIS — R0609 Other forms of dyspnea: Secondary | ICD-10-CM

## 2019-02-28 DIAGNOSIS — F17210 Nicotine dependence, cigarettes, uncomplicated: Secondary | ICD-10-CM | POA: Insufficient documentation

## 2019-02-28 DIAGNOSIS — Z85828 Personal history of other malignant neoplasm of skin: Secondary | ICD-10-CM | POA: Diagnosis not present

## 2019-02-28 DIAGNOSIS — Z79899 Other long term (current) drug therapy: Secondary | ICD-10-CM | POA: Diagnosis not present

## 2019-02-28 DIAGNOSIS — R0602 Shortness of breath: Secondary | ICD-10-CM | POA: Diagnosis not present

## 2019-02-28 DIAGNOSIS — R05 Cough: Secondary | ICD-10-CM | POA: Diagnosis not present

## 2019-02-28 LAB — COMPREHENSIVE METABOLIC PANEL
ALT: 31 U/L (ref 0–44)
AST: 22 U/L (ref 15–41)
Albumin: 4.5 g/dL (ref 3.5–5.0)
Alkaline Phosphatase: 76 U/L (ref 38–126)
Anion gap: 10 (ref 5–15)
BUN: 10 mg/dL (ref 6–20)
CO2: 23 mmol/L (ref 22–32)
Calcium: 9.4 mg/dL (ref 8.9–10.3)
Chloride: 111 mmol/L (ref 98–111)
Creatinine, Ser: 0.72 mg/dL (ref 0.61–1.24)
GFR calc Af Amer: >60 mL/min (ref >60)
GFR calc non Af Amer: >60 mL/min (ref >60)
Glucose, Bld: 105 mg/dL — ABNORMAL HIGH (ref 70–99)
Potassium: 3.9 mmol/L (ref 3.5–5.1)
Sodium: 144 mmol/L (ref 135–145)
Total Bilirubin: 0.3 mg/dL (ref 0.3–1.2)
Total Protein: 7.2 g/dL (ref 6.5–8.1)

## 2019-02-28 LAB — URINALYSIS, COMPLETE (UACMP) WITH MICROSCOPIC
Bacteria, UA: NONE SEEN
Bilirubin Urine: NEGATIVE
Glucose, UA: NEGATIVE mg/dL
Hgb urine dipstick: NEGATIVE
Ketones, ur: NEGATIVE mg/dL
Leukocytes,Ua: NEGATIVE
Nitrite: NEGATIVE
Protein, ur: NEGATIVE mg/dL
Specific Gravity, Urine: 1.009 (ref 1.005–1.030)
Squamous Epithelial / HPF: NONE SEEN (ref 0–5)
pH: 6 (ref 5.0–8.0)

## 2019-02-28 LAB — TROPONIN I: Troponin I: <0.03 ng/mL (ref <0.03)

## 2019-02-28 LAB — CBC WITH DIFFERENTIAL/PLATELET
Abs Immature Granulocytes: 0.08 10*3/uL — ABNORMAL HIGH (ref 0.00–0.07)
Basophils Absolute: 0.1 10*3/uL (ref 0.0–0.1)
Basophils Relative: 1 %
Eosinophils Absolute: 0.6 10*3/uL — ABNORMAL HIGH (ref 0.0–0.5)
Eosinophils Relative: 7 %
HCT: 41.8 % (ref 39.0–52.0)
Hemoglobin: 13.8 g/dL (ref 13.0–17.0)
Immature Granulocytes: 1 %
Lymphocytes Relative: 20 %
Lymphs Abs: 1.7 10*3/uL (ref 0.7–4.0)
MCH: 30.2 pg (ref 26.0–34.0)
MCHC: 33 g/dL (ref 30.0–36.0)
MCV: 91.5 fL (ref 80.0–100.0)
Monocytes Absolute: 0.6 10*3/uL (ref 0.1–1.0)
Monocytes Relative: 8 %
Neutro Abs: 5.2 10*3/uL (ref 1.7–7.7)
Neutrophils Relative %: 63 %
Platelets: 157 10*3/uL (ref 150–400)
RBC: 4.57 MIL/uL (ref 4.22–5.81)
RDW: 13.2 % (ref 11.5–15.5)
WBC: 8.2 10*3/uL (ref 4.0–10.5)
nRBC: 0 % (ref 0.0–0.2)

## 2019-02-28 LAB — FIBRIN DERIVATIVES D-DIMER (ARMC ONLY): Fibrin derivatives D-dimer (ARMC): 287.86 ng/mL (FEU) (ref 0.00–499.00)

## 2019-02-28 MED ORDER — ASPIRIN 81 MG PO CHEW: 324.0000 mg | CHEWABLE_TABLET | Freq: Once | ORAL | Status: DC

## 2019-02-28 NOTE — ED Triage Notes (Signed)
Pt brought in via wheelchair.  Pt referred from PMD.  Pt has a cough, dizziness and sob.  Sx for several days.  States tested Negative for Covid twice.  Pt alert.

## 2019-02-28 NOTE — ED Notes (Signed)
Ct completed, patient laying in bed resting comfortably. Unable to provide urine specimen. Patient reports has prostate issues sometime he can not void on demand.

## 2019-02-28 NOTE — ED Provider Notes (Signed)
[facility] Emergency Department Provider Note  ____________________________________________   First MD Initiated Contact with Patient 02/28/19 1651     (approximate)  I have reviewed the triage vital signs and the nursing notes.   HISTORY  Chief Complaint Cough; Shortness of Breath; and Dizziness   HPI Anthony Nguyen is a 63 y.o. male who presents to the emergency department for treatment and evaluation of cough, dizziness, and shortness of breath for the past several days.  Patient states that he fell this morning.  He is unsure whether he struck his head.  The fall was unwitnessed.   Patient has tested negative for COVID-19 on 2 separate occasions, but he states that his white blood cell count has been elevated and this is very concerning to him.  He is currently on Augmentin, Tessalon, Phenergan with dextromethorphan for cough and oxycodone for his chronic pain.  Past Medical History:  Diagnosis Date  . Allergy   . Arthritis    osteoarthritis  . Cancer ([place])    melanoma skin cancer face  . Chronic back pain    goes to Dr. Holley Raring at the pain clinic  . Current every day smoker   . Diverticulosis   . GERD (gastroesophageal reflux disease)   . H/O diverticulitis of colon   . Restless leg syndrome   . Sciatic pain     Patient Active Problem List   Diagnosis Date Noted  . Essential hypertension 11/28/2018  . Benign prostatic hyperplasia with urinary frequency 04/04/2018  . Lumbar radiculopathy 12/29/2017  . Lumbar degenerative disc disease 12/29/2017  . Lumbar foraminal stenosis 12/29/2017  . History of left knee replacement 12/29/2017  . Primary osteoarthritis of both knees 12/29/2017  . Chronic pain syndrome 12/29/2017  . Primary osteoarthritis of left knee 10/14/2017  . Chronic nasal congestion 11/24/2013  . Benign paroxysmal positional vertigo 11/24/2013  . Sleep disturbance 08/17/2013  . Allergy 08/17/2013  . GERD (gastroesophageal  reflux disease) 08/17/2013  . Tobacco abuse counseling 08/17/2013  . Ringworm 08/17/2013    Past Surgical History:  Procedure Laterality Date  . JOINT REPLACEMENT Left 10/14/2018   knee  . skin cancer removal     face  . TONSILLECTOMY AND ADENOIDECTOMY    . TOTAL KNEE ARTHROPLASTY Left 10/14/2017   Procedure: TOTAL KNEE ARTHROPLASTY;  Surgeon: Hessie Knows, MD;  Location: [facility] ORS;  Service: Orthopedics;  Laterality: Left;    Prior to Admission medications   Medication Sig Start Date End Date Taking? Authorizing Provider  albuterol (VENTOLIN HFA) 108 (90 Base) MCG/ACT inhaler Inhale 2 puffs into the lungs every 6 (six) hours as needed for wheezing or shortness of breath. 02/09/19   Merlyn Lot, MD  amoxicillin-clavulanate (AUGMENTIN) 875-125 MG tablet Take by mouth. 02/23/19 03/02/19  [provider]  beclomethasone (QVAR) 40 MCG/ACT inhaler Inhale into the lungs. 02/23/19   [provider]  benzonatate (TESSALON) 200 MG capsule TAKE 1 CAPSULE BY MOUTH THREE TIMES DAILY AS NEEDED FOR COUGH FOR UP TO 7 DAYS 02/23/19   [provider]  diclofenac (VOLTAREN) 75 MG EC tablet Take 1 tablet (75 mg total) by mouth 2 (two) times daily as needed for moderate pain. 01/23/19   Mikey College, NP  Diclofenac Sodium 3 % GEL Place 1 application onto the skin 3 (three) times daily as needed (left knee pain). 03/10/18   Mikey College, NP  lisinopril (PRINIVIL,ZESTRIL) 10 MG tablet Take 1 tablet (10 mg total) by mouth daily. 11/28/18  Mikey College, NP  ondansetron (ZOFRAN) 4 MG tablet Take 1 tablet (4 mg total) by mouth daily as needed for nausea or vomiting. Patient not taking: Reported on 02/28/2019 02/09/19 02/09/20  Merlyn Lot, MD  Oxycodone HCl 10 MG TABS Take 1 tablet (10 mg total) by mouth every 8 (eight) hours as needed for up to 30 days. Max 75/month 02/04/19 03/06/19  [name], MD  Oxycodone HCl 10 MG TABS Take 1 tablet (10 mg total) by mouth  every 8 (eight) hours as needed for up to 30 days. Must last 30 days. Max 75/month Patient not taking: Reported on 02/28/2019 03/05/19 04/04/19  [name], MD  promethazine-dextromethorphan (PROMETHAZINE-DM) 6.25-15 MG/5ML syrup TAKE 5 ML BY MOUTH EVERY 6 HOURS AS NEEDED FOR COUGH 02/23/19   [provider]  tiZANidine (ZANAFLEX) 4 MG tablet Take 1 tablet (4 mg total) by mouth every 8 (eight) hours as needed for muscle spasms. 01/23/19   Mikey College, NP    Allergies Patient has no known allergies.  Family History  Problem Relation Age of Onset  . Hypertension Mother   . Alzheimer's disease Father   . Prostate cancer Father 24  . Cancer Sister 48       breast cancer  . Diabetes Sister   . Heart disease Brother        electrical pathways - ongoing workup  . Healthy Brother   . Healthy Son   . Healthy Son   . Healthy Son   . Healthy Son     Social History Social History   Tobacco Use  . Smoking status: Current Every Day Smoker    Packs/day: 2.00    Years: 30.00    Pack years: 60.00    Types: Cigarettes  . Smokeless tobacco: Never Used  Substance Use Topics  . Alcohol use: No    Frequency: Never  . Drug use: No    Review of Systems  Constitutional: No fever/chills. Eyes: No visual changes. ENT: No sore throat. Cardiovascular: Negative for chest pain. Negative for pleuritic pain. negative for palpitations. Negative for leg pain. Respiratory: Positive shortness of breath. Gastrointestinal: negative abdominal pain. Negative for nausea, no vomiting.  No diarrhea.  No constipation. Genitourinary: Negative for dysuria. Musculoskeletal: Positive for chronic back pain.  Skin: Negative for rash, lesion, wound. Neurological: Negative for headaches, focal weakness or numbness. ___________________________________________   PHYSICAL EXAM:  VITAL SIGNS: ED Triage Vitals [02/28/19 1615]  Enc Vitals Group     BP 126/71     Pulse Rate (!) 101     Resp 15      Temp 98.1 F (36.7 C)     Temp Source Oral     SpO2 92 %     Weight 225 lb (102.1 kg)     Height 6\' 1"  (1.854 m)     Head Circumference      Peak Flow      Pain Score 10     Pain Loc      Pain Edu?      Excl. in [place]?     Constitutional: Alert and oriented. Well appearing and in no acute distress. Eyes: Conjunctivae are normal. PERRL. Head: Atraumatic. Nose: No congestion/rhinnorhea. Mouth/Throat: Mucous membranes are moist.  Oropharynx non-erythematous. Tongue normal in size and color. Neck: No stridor. No carotid bruit appreciated on exam. Hematological/Lymphatic/Immunilogical: No cervical lymphadenopathy. Cardiovascular: Normal rate, regular rhythm. Grossly normal heart sounds.  Good peripheral circulation. Respiratory: Normal respiratory effort.  No retractions.  Lungs CTAB. Gastrointestinal: Soft and nontender. No distention. No abdominal bruits. No CVA tenderness. Genitourinary: Exam deferred. Musculoskeletal: No lower extremity tenderness. No edema of extremities. Neurologic:  Normal speech and language. No gross focal neurologic deficits are appreciated. Skin:  Skin is warm, dry and intact. No rash noted. Psychiatric: Mood and affect are normal. Speech and behavior are normal.  ____________________________________________   LABS (all labs ordered are listed, but only abnormal results are displayed)  Labs Reviewed  CBC WITH DIFFERENTIAL/PLATELET - Abnormal; Notable for the following components:      Result Value   Eosinophils Absolute 0.6 (*)    Abs Immature Granulocytes 0.08 (*)    All other components within normal limits  COMPREHENSIVE METABOLIC PANEL - Abnormal; Notable for the following components:   Glucose, Bld 105 (*)    All other components within normal limits  URINALYSIS, COMPLETE (UACMP) WITH MICROSCOPIC - Abnormal; Notable for the following components:   Color, Urine STRAW (*)    APPearance CLEAR (*)    All other components within normal limits   FIBRIN DERIVATIVES D-DIMER ([facility] ONLY)  TROPONIN I   ____________________________________________  EKG  ED ECG REPORT I, Kissy Cielo, FNP-BC personally viewed and interpreted this ECG.   Date: 03/01/2019  EKG Time: 1617  Rate: 98  Rhythm: normal EKG, normal sinus rhythm  Axis: right  Intervals:none  ST&T Change: no ST elevation  ____________________________________________  RADIOLOGY  ED MD interpretation: CT head and chest x-ray are both negative for acute findings.  Official radiology report(s): Ct Head Wo Contrast  Result Date: 02/28/2019 CLINICAL DATA:  Altered mental status.  History of melanoma EXAM: CT HEAD WITHOUT CONTRAST TECHNIQUE: Contiguous axial images were obtained from the base of the skull through the vertex without intravenous contrast. COMPARISON:  November 07, 2014 FINDINGS: Brain: Ventricles are normal in size and configuration. There is no intracranial mass, hemorrhage, extra-axial fluid collection, or midline shift. The brain parenchyma appears normal. No acute infarct is demonstrable. Vascular: No hyperdense vessels. There are foci of calcification in each carotid siphon region. Skull: The bony calvarium appears intact. Sinuses/Orbits: There is opacification in multiple ethmoid air cells. There is mild mucosal thickening in the superior right maxillary antrum. Orbits appear symmetric bilaterally. Other: Mastoid air cells are clear. IMPRESSION: Brain parenchyma appears unremarkable. No mass, hemorrhage, or evident acute infarct. There are foci of arterial vascular calcification. There are foci of paranasal sinus disease. Electronically Signed   By: Lowella Grip III M.D.   On: 02/28/2019 18:07   Dg Chest Portable 1 View  Result Date: 02/28/2019 CLINICAL DATA:  63 year old male with cough and shortness of breath. EXAM: PORTABLE CHEST 1 VIEW COMPARISON:  Chest radiograph dated 02/09/2019 FINDINGS: Shallow inspiration. There is a background of emphysema.  Diffuse interstitial prominence and coarsening similar to prior radiograph. No consolidative changes, pleural effusion, or pneumothorax. The cardiac silhouette is within normal limits. No acute osseous pathology. IMPRESSION: No acute cardiopulmonary process. Electronically Signed   By: Anner Crete M.D.   On: 02/28/2019 20:07    ____________________________________________   PROCEDURES  Procedure(s) performed: None  Procedures  Critical Care performed: No  ____________________________________________   INITIAL IMPRESSION / ASSESSMENT AND PLAN / ED COURSE  As part of my medical decision making, I reviewed the following data within the electronic MEDICAL RECORD NUMBER Notes from prior ED visits  63 year old male presenting to the emergency department for multiple medical complaints.  He vaguely describes a fall this morning and is unsure whether or not  he lost consciousness or struck his head.  CT has been requested.  Patient has slow speech but it is clear.  Feel this is likely related to medications including his Zanaflex, promethazine with dextromethorphan, and oxycodone.  Although for which he states that he has taken today.  RN reports that the patient is demanding to leave and has pulled out his IV.  Thus far, all of his test results are reassuring including the CT scan of his head.  Therefore, if he would like to sign out AMA, that it is completely fine as long as he is aware that some tests are still pending and diagnosis has not been completed.  Patient decided to stay.  Somehow the chest x-ray got admitted from the initial protocol orders and has now been completed.  All test results are reassuring.  He will be discharged home.  Patient was strongly advised not to intermix all of the medications that may cause dizziness or drowsiness.  He continues to be concerned about the white blood cell count which is normal today.  I advised him that it may be related to the steroid injections  that he had been given in his back.  He states that he gets these at least every 3 months.  Patient seemed to feel more reassured and was discharged.  Work note was provided.     ____________________________________________   FINAL CLINICAL IMPRESSION(S) / ED DIAGNOSES  Final diagnoses:  Cough  Dizziness  Shortness of breath     ED Discharge Orders    None       Note:  This document was prepared using Dragon voice recognition software and may include unintentional dictation errors.    Victorino Dike, FNP 03/01/19 1623    Nena Polio, MD 03/02/19 [phone]

## 2019-02-28 NOTE — ED Notes (Signed)
Chest xray completed.

## 2019-02-28 NOTE — ED Notes (Signed)
Cup of ginger ale given by this tech. RN aware. [place]

## 2019-02-28 NOTE — ED Notes (Signed)
Patient reports not feeling well x 1 week, tested for covid 2x and came back negative both times.

## 2019-02-28 NOTE — Progress Notes (Signed)
Virtual Visit via Telephone The purpose of this virtual visit is to provide medical care while limiting exposure to the novel coronavirus (COVID19) for both patient and office staff.  Consent was obtained for phone visit:  Yes.   Answered questions that patient had about telehealth interaction:  Yes.   I discussed the limitations, risks, security and privacy concerns of performing an evaluation and management service by telephone. I also discussed with the patient that there may be a patient responsible charge related to this service. The patient expressed understanding and agreed to proceed.  Patient Location: Home Provider Location: [facility] (Office)  PCP is Cassell Smiles, AGPCNP-BC - I am currently covering during her maternity leave.   ---------------------------------------------------------------------- Chief Complaint  Patient presents with  . Shortness of Breath    dizziness, chest pain, difficutly with laying on his side and productive coughing     S: Reviewed CMA documentation. I have called patient and gathered additional HPI as follows:  Shortness of breath on exertion / productive cough / Possible CAP vs COPD Exacerbation - recent course patient seen at [facility] ED on 02/09/19 for constellation of symptoms at that time mostly GI and some URI / RUQ pain / Cough, he had work-up with labs Chest X-ray, coronavirus testing, RUQ Abd [place], EKG, ultimately discharged with Doxycycline and anti-emetic. - He then presented to [facility] on 02/23/19 after not improving, was placed on Augmentin and treated for COPD with breathing treatments albuterol, cough medicine, anti emetic, also repeated coronavirus testing - On 02/25/19 patient was notified that coronavirus testing was NEGATIVE per [facility]  Now patient calls here to PCP office for virtual visit follow-up from recent illness, he is not improved. He still has symptoms of productive phlegm and cough now  persistent for 1 month. He is now endorsing more dyspnea or shortness of breath on exertion. - He is smoking more often up to 2ppd, never previously diagnosed with COPD formally before, he is using breathing treatments but unsure if helping. He still feels fatigued and reduced energy, and dizziness at times, and side pain when laying down.  Denies any high risk travel to areas of current concern for COVID19. Denies any known or suspected exposure to person with or possibly with COVID19.  Admits nausea Admits productive cough Denies any fevers, chills, sweats, body ache, sinus pain or pressure, headache, abdominal pain, diarrhea  Past Medical History:  Diagnosis Date  . Allergy   . Arthritis    osteoarthritis  . Cancer ([place])    melanoma skin cancer face  . Chronic back pain    goes to Dr. Holley Raring at the pain clinic  . Current every day smoker   . Diverticulosis   . GERD (gastroesophageal reflux disease)   . H/O diverticulitis of colon   . Restless leg syndrome   . Sciatic pain    Social History   Tobacco Use  . Smoking status: Current Every Day Smoker    Packs/day: 2.00    Years: 30.00    Pack years: 60.00    Types: Cigarettes  . Smokeless tobacco: Never Used  Substance Use Topics  . Alcohol use: No    Frequency: Never  . Drug use: No    Current Outpatient Medications:  .  albuterol (VENTOLIN HFA) 108 (90 Base) MCG/ACT inhaler, Inhale 2 puffs into the lungs every 6 (six) hours as needed for wheezing or shortness of breath., Disp: 1 Inhaler, Rfl: 2 .  amoxicillin-clavulanate (AUGMENTIN) 875-125  MG tablet, Take by mouth., Disp: , Rfl:  .  beclomethasone (QVAR) 40 MCG/ACT inhaler, Inhale into the lungs., Disp: , Rfl:  .  benzonatate (TESSALON) 200 MG capsule, TAKE 1 CAPSULE BY MOUTH THREE TIMES DAILY AS NEEDED FOR COUGH FOR UP TO 7 DAYS, Disp: , Rfl:  .  diclofenac (VOLTAREN) 75 MG EC tablet, Take 1 tablet (75 mg total) by mouth 2 (two) times daily as needed for moderate  pain., Disp: 30 tablet, Rfl: 0 .  Diclofenac Sodium 3 % GEL, Place 1 application onto the skin 3 (three) times daily as needed (left knee pain)., Disp: 2 Tube, Rfl: 2 .  lisinopril (PRINIVIL,ZESTRIL) 10 MG tablet, Take 1 tablet (10 mg total) by mouth daily., Disp: 90 tablet, Rfl: 1 .  Oxycodone HCl 10 MG TABS, Take 1 tablet (10 mg total) by mouth every 8 (eight) hours as needed for up to 30 days. Max 75/month, Disp: 75 tablet, Rfl: 0 .  promethazine-dextromethorphan (PROMETHAZINE-DM) 6.25-15 MG/5ML syrup, TAKE 5 ML BY MOUTH EVERY 6 HOURS AS NEEDED FOR COUGH, Disp: , Rfl:  .  tiZANidine (ZANAFLEX) 4 MG tablet, Take 1 tablet (4 mg total) by mouth every 8 (eight) hours as needed for muscle spasms., Disp: 60 tablet, Rfl: 1 .  ondansetron (ZOFRAN) 4 MG tablet, Take 1 tablet (4 mg total) by mouth daily as needed for nausea or vomiting. (Patient not taking: Reported on 02/28/2019), Disp: 14 tablet, Rfl: 0 .  [START ON 03/05/2019] Oxycodone HCl 10 MG TABS, Take 1 tablet (10 mg total) by mouth every 8 (eight) hours as needed for up to 30 days. Must last 30 days. Max 75/month (Patient not taking: Reported on 02/28/2019), Disp: 75 tablet, Rfl: 0  Depression screen [facility] 2/9 12/28/2018 12/06/2018 11/08/2018  Decreased Interest 0 0 0  Down, Depressed, Hopeless 0 0 0  PHQ - 2 Score 0 0 0  Altered sleeping - - -  Tired, decreased energy - - -  Change in appetite - - -  Feeling bad or failure about yourself  - - -  Trouble concentrating - - -  Moving slowly or fidgety/restless - - -  Suicidal thoughts - - -  PHQ-9 Score - - -  Difficult doing work/chores - - -    No flowsheet data found.  -------------------------------------------------------------------------- O: No physical exam performed due to remote telephone encounter.  Lab results reviewed.  Recent Results (from the past 2160 hour(s))  ToxASSURE Select 13 (MW), Urine     Status: None   Collection Time: 12/28/18  3:24 PM  Result Value Ref Range    Summary FINAL     Comment: ==================================================================== TOXASSURE SELECT 13 (MW) ==================================================================== Test                             Result       Flag       Units Drug Present and Declared for Prescription Verification   Oxycodone                      314          EXPECTED   ng/mg creat   Oxymorphone                    331          EXPECTED   ng/mg creat   Noroxycodone  1838         EXPECTED   ng/mg creat   Noroxymorphone                 130          EXPECTED   ng/mg creat    Sources of oxycodone are scheduled prescription medications.    Oxymorphone, noroxycodone, and noroxymorphone are expected    metabolites of oxycodone. Oxymorphone is also available as a    scheduled prescription medication. ==================================================================== Test                      Result    Flag   Units      Ref Range   Creatinine              102              mg/dL      >=20 ====== ============================================================== Declared Medications:  The flagging and interpretation on this report are based on the  following declared medications.  Unexpected results may arise from  inaccuracies in the declared medications.  **Note: The testing scope of this panel includes these medications:  Oxycodone  **Note: The testing scope of this panel does not include following  reported medications:  Diclofenac  Lisinopril  Melatonin ==================================================================== For clinical consultation, please call [phone]. ====================================================================   Comprehensive metabolic panel     Status: Abnormal   Collection Time: 02/09/19  5:32 PM  Result Value Ref Range   Sodium 138 135 - 145 mmol/L   Potassium 4.3 3.5 - 5.1 mmol/L   Chloride 105 98 - 111 mmol/L   CO2 25 22 - 32 mmol/L    Glucose, Bld 136 (H) 70 - 99 mg/dL   BUN 19 6 - 20 mg/dL   Creatinine, Ser 1.12 0.61 - 1.24 mg/dL   Calcium 9.6 8.9 - 10.3 mg/dL   Total Protein 8.2 (H) 6.5 - 8.1 g/dL   Albumin 4.8 3.5 - 5.0 g/dL   AST 23 15 - 41 U/L   ALT 22 0 - 44 U/L   Alkaline Phosphatase 76 38 - 126 U/L   Total Bilirubin 1.0 0.3 - 1.2 mg/dL   GFR calc non Af Amer >60 >60 mL/min   GFR calc Af Amer >60 >60 mL/min   Anion gap 8 5 - 15    Comment: Performed at [facility], [address] [place], [place] [address]  CBC with Differential     Status: Abnormal   Collection Time: 02/09/19  5:32 PM  Result Value Ref Range   WBC 13.3 (H) 4.0 - 10.5 K/uL   RBC 5.17 4.22 - 5.81 MIL/uL   Hemoglobin 15.4 13.0 - 17.0 g/dL   HCT 47.0 39.0 - 52.0 %   MCV 90.9 80.0 - 100.0 fL   MCH 29.8 26.0 - 34.0 pg   MCHC 32.8 30.0 - 36.0 g/dL   RDW 12.9 11.5 - 15.5 %   Platelets 212 150 - 400 K/uL   nRBC 0.0 0.0 - 0.2 %   Neutrophils Relative % 80 %   Neutro Abs 10.5 (H) 1.7 - 7.7 K/uL   Lymphocytes Relative 13 %   Lymphs Abs 1.8 0.7 - 4.0 K/uL   Monocytes Relative 6 %   Monocytes Absolute 0.8 0.1 - 1.0 K/uL   Eosinophils Relative 0 %   Eosinophils Absolute 0.1 0.0 - 0.5 K/uL   Basophils Relative 1 %   Basophils Absolute 0.1 0.0 -  0.1 K/uL   Immature Granulocytes 0 %   Abs Immature Granulocytes 0.05 0.00 - 0.07 K/uL    Comment: Performed at [facility], [address] [place], [place] [address]  Lipase, blood     Status: None   Collection Time: 02/09/19  5:32 PM  Result Value Ref Range   Lipase 24 11 - 51 U/L    Comment: Performed at [facility], [address] [place], [place] [address]  Troponin I -     Status: None   Collection Time: 02/09/19  5:32 PM  Result Value Ref Range   Troponin I <0.03 <0.03 ng/mL    Comment: Performed at [facility], [address] [place], [place] [address]  Novel Coronavirus, NAA (hospital order; send-out to ref lab)     Status: None   Collection  Time: 02/09/19  6:03 PM  Result Value Ref Range   SARS-CoV-2, NAA NOT DETECTED NOT DETECTED    Comment: (NOTE) This test was developed and its performance characteristics determined by [employer]. This test has not been FDA cleared or approved. This test has been authorized by FDA under an Emergency Use Authorization (EUA). This test has been validated in accordance with the FDA's Guidance Document (Policy for [place] in Laboratories Certified to Perform High Complexity Testing under CLIA prior to Emergency Use Authorization for Coronavirus [id number]-8676 during the [facility] Emergency) issued on February 29th, 2020. FDA independent review of this validation is pending. This test is only authorized for the duration of time the declaration that circumstances exist justifying the authorization of the emergency use of in vitro diagnostic tests for detection of SARS-CoV- 2 virus and/or diagnosis of COVID-19 infection under section 564(b)(1) of the Act, 21 U.S.C. 720NOB-0(J)(6), unless the authorization is terminated or revoked sooner. Performed At: [facility] [address] [place], [place] [id number] Rush Farmer MD ML:[id number]    Coronavirus Source NASOPHARYNGEAL     Comment: Performed at [facility], [address] [place], [place] [address]    -------------------------------------------------------------------------- A&P:  Problem List Items Addressed This Visit    None    Visit Diagnoses    Dyspnea on exertion    -  Primary   Recurrent productive cough         Clinically concerning constellation of symptoms not improving after 3+ weeks after ED visit and [facility] visit, multiple antibiotics, breathing treatments limited relief, unremarkable work-up on CXR 02/09/19 and Coronavirus testing NEGATIVE 02/25/19.  Cannot rule out Pneumonia or possibly Acute COPD exacerbation, despite no formal COPD diagnosis Concern as  well some mild confusion and dizziness, seems patient may have some mild altered mental status from his baseline  Plan Advised to go DIRECTLY to [facility] ED - wife would drive him by personal vehicle  I think this patient needs higher level of care, than telemedicine management at this time.  I called [facility] ED Triage, notified them - I would recommend a Chest CT imaging or advanced imaging to investigate possible underlying Pneumonia - still cannot rule out complication from potential coronavirus or other viral syndrome  Possibly he could benefit from a steroid course as well for breathing if indicated.   No orders of the defined types were placed in this encounter.  Follow-up as needed  Patient verbalizes understanding with the above medical recommendations including the limitation of remote medical advice.  Specific follow-up and call-back criteria were given for patient to follow-up or seek medical care  more urgently if needed.   - Time spent in direct consultation with patient on phone: 7 minutes  Nobie Putnam, [facility] 02/28/2019, 2:41 PM

## 2019-02-28 NOTE — Discharge Instructions (Signed)
Follow up with your primary care provider if not feeling better in 2 days. Return to the ER for symptoms of concern.

## 2019-02-28 NOTE — Patient Instructions (Addendum)
AVS info given by phone.  Go directly to [facility] ED.

## 2019-03-03 ENCOUNTER — Other Ambulatory Visit: Payer: Self-pay | Admitting: Nurse Practitioner

## 2019-03-03 DIAGNOSIS — I10 Essential (primary) hypertension: Secondary | ICD-10-CM

## 2019-03-20 ENCOUNTER — Telehealth: Payer: Self-pay | Admitting: Gastroenterology

## 2019-03-20 ENCOUNTER — Emergency Department
Admission: EM | Admit: 2019-03-20 | Discharge: 2019-03-20 | Disposition: A | Discharge status: Home / Self Care | Payer: PRIVATE HEALTH INSURANCE | Attending: Emergency Medicine | Admitting: Emergency Medicine

## 2019-03-20 ENCOUNTER — Other Ambulatory Visit: Payer: Self-pay

## 2019-03-20 DIAGNOSIS — Z79899 Other long term (current) drug therapy: Secondary | ICD-10-CM | POA: Diagnosis not present

## 2019-03-20 DIAGNOSIS — R197 Diarrhea, unspecified: Secondary | ICD-10-CM | POA: Insufficient documentation

## 2019-03-20 DIAGNOSIS — F17210 Nicotine dependence, cigarettes, uncomplicated: Secondary | ICD-10-CM | POA: Diagnosis not present

## 2019-03-20 DIAGNOSIS — Z96651 Presence of right artificial knee joint: Secondary | ICD-10-CM | POA: Insufficient documentation

## 2019-03-20 DIAGNOSIS — R1032 Left lower quadrant pain: Secondary | ICD-10-CM | POA: Diagnosis present

## 2019-03-20 DIAGNOSIS — R1030 Lower abdominal pain, unspecified: Secondary | ICD-10-CM

## 2019-03-20 DIAGNOSIS — Z85828 Personal history of other malignant neoplasm of skin: Secondary | ICD-10-CM | POA: Insufficient documentation

## 2019-03-20 DIAGNOSIS — I10 Essential (primary) hypertension: Secondary | ICD-10-CM | POA: Insufficient documentation

## 2019-03-20 LAB — URINALYSIS, COMPLETE (UACMP) WITH MICROSCOPIC
Bacteria, UA: NONE SEEN
Bilirubin Urine: NEGATIVE
Glucose, UA: NEGATIVE mg/dL
Hgb urine dipstick: NEGATIVE
Ketones, ur: NEGATIVE mg/dL
Leukocytes,Ua: NEGATIVE
Nitrite: NEGATIVE
Protein, ur: 30 mg/dL — AB
Specific Gravity, Urine: 1.031 — ABNORMAL HIGH (ref 1.005–1.030)
pH: 5 (ref 5.0–8.0)

## 2019-03-20 LAB — COMPREHENSIVE METABOLIC PANEL
ALT: 26 U/L (ref 0–44)
AST: 22 U/L (ref 15–41)
Albumin: 4.5 g/dL (ref 3.5–5.0)
Alkaline Phosphatase: 63 U/L (ref 38–126)
Anion gap: 8 (ref 5–15)
BUN: 17 mg/dL (ref 6–20)
CO2: 22 mmol/L (ref 22–32)
Calcium: 9.2 mg/dL (ref 8.9–10.3)
Chloride: 110 mmol/L (ref 98–111)
Creatinine, Ser: 0.66 mg/dL (ref 0.61–1.24)
GFR calc Af Amer: >60 mL/min (ref >60)
GFR calc non Af Amer: >60 mL/min (ref >60)
Glucose, Bld: 138 mg/dL — ABNORMAL HIGH (ref 70–99)
Potassium: 3.4 mmol/L — ABNORMAL LOW (ref 3.5–5.1)
Sodium: 140 mmol/L (ref 135–145)
Total Bilirubin: 1.1 mg/dL (ref 0.3–1.2)
Total Protein: 7.8 g/dL (ref 6.5–8.1)

## 2019-03-20 LAB — CBC
HCT: 40.5 % (ref 39.0–52.0)
Hemoglobin: 13.5 g/dL (ref 13.0–17.0)
MCH: 30.2 pg (ref 26.0–34.0)
MCHC: 33.3 g/dL (ref 30.0–36.0)
MCV: 90.6 fL (ref 80.0–100.0)
Platelets: 219 10*3/uL (ref 150–400)
RBC: 4.47 MIL/uL (ref 4.22–5.81)
RDW: 12.6 % (ref 11.5–15.5)
WBC: 8.8 10*3/uL (ref 4.0–10.5)
nRBC: 0 % (ref 0.0–0.2)

## 2019-03-20 LAB — TROPONIN I: Troponin I: <0.03 ng/mL (ref <0.03)

## 2019-03-20 LAB — LIPASE, BLOOD: Lipase: 50 U/L (ref 11–51)

## 2019-03-20 MED ORDER — SODIUM CHLORIDE 0.9% FLUSH: 3.0000 mL | Freq: Once | INTRAVENOUS | Status: DC

## 2019-03-20 MED ORDER — DICYCLOMINE HCL 20 MG PO TABS: 20.0000 mg | ORAL_TABLET | Freq: Three times a day (TID) | ORAL | 0 refills | Status: DC | PRN

## 2019-03-20 MED ORDER — SODIUM CHLORIDE 0.9 % IV SOLN
1000.0000 mL | Freq: Once | INTRAVENOUS | Status: AC
  Administered 2019-03-20: 1000 mL via INTRAVENOUS

## 2019-03-20 NOTE — ED Notes (Signed)
Green and purple top tubes sent to lab.

## 2019-03-20 NOTE — ED Notes (Signed)
MD in room to update patient on plan of care.  Will continue to monitor.   

## 2019-03-20 NOTE — ED Triage Notes (Signed)
Pt c/o abd pain/burning for the past 3 days with watery diarrhea

## 2019-03-20 NOTE — ED Provider Notes (Signed)
[facility] Emergency Department Provider Note   ____________________________________________    I have reviewed the triage vital signs and the nursing notes.   HISTORY  Chief Complaint Abdominal Pain     HPI Anthony Nguyen is a 63 y.o. male who presents with complaints of left lower quadrant abdominal discomfort coupled with diarrhea over the last 3 days.  Patient notes that he has been on antibiotics for quite some time although he is not entirely sure why.  He notes that he was seen in the emergency department 1 month ago and since then has seen outpatient doctor twice and has been continued on antibiotics.  Denies fevers or chills.  Reports watery brown stool.  Intermittent cramping particularly with diarrhea.  Feels dehydrated.  Has not taken anything for this  Past Medical History:  Diagnosis Date  . Allergy   . Arthritis    osteoarthritis  . Cancer ([place])    melanoma skin cancer face  . Chronic back pain    goes to Dr. Holley Raring at the pain clinic  . Current every day smoker   . Diverticulosis   . GERD (gastroesophageal reflux disease)   . H/O diverticulitis of colon   . Restless leg syndrome   . Sciatic pain     Patient Active Problem List   Diagnosis Date Noted  . Essential hypertension 11/28/2018  . Benign prostatic hyperplasia with urinary frequency 04/04/2018  . Lumbar radiculopathy 12/29/2017  . Lumbar degenerative disc disease 12/29/2017  . Lumbar foraminal stenosis 12/29/2017  . History of left knee replacement 12/29/2017  . Primary osteoarthritis of both knees 12/29/2017  . Chronic pain syndrome 12/29/2017  . Primary osteoarthritis of left knee 10/14/2017  . Chronic nasal congestion 11/24/2013  . Benign paroxysmal positional vertigo 11/24/2013  . Sleep disturbance 08/17/2013  . Allergy 08/17/2013  . GERD (gastroesophageal reflux disease) 08/17/2013  . Tobacco abuse counseling 08/17/2013  . Ringworm 08/17/2013     Past Surgical History:  Procedure Laterality Date  . JOINT REPLACEMENT Left 10/14/2018   knee  . skin cancer removal     face  . TONSILLECTOMY AND ADENOIDECTOMY    . TOTAL KNEE ARTHROPLASTY Left 10/14/2017   Procedure: TOTAL KNEE ARTHROPLASTY;  Surgeon: Hessie Knows, MD;  Location: [facility] ORS;  Service: Orthopedics;  Laterality: Left;    Prior to Admission medications   Medication Sig Start Date End Date Taking? Authorizing Provider  albuterol (VENTOLIN HFA) 108 (90 Base) MCG/ACT inhaler Inhale 2 puffs into the lungs every 6 (six) hours as needed for wheezing or shortness of breath. 02/09/19   Merlyn Lot, MD  beclomethasone (QVAR) 40 MCG/ACT inhaler Inhale into the lungs. 02/23/19   [provider]  benzonatate (TESSALON) 200 MG capsule TAKE 1 CAPSULE BY MOUTH THREE TIMES DAILY AS NEEDED FOR COUGH FOR UP TO 7 DAYS 02/23/19   [provider]  diclofenac (VOLTAREN) 75 MG EC tablet Take 1 tablet (75 mg total) by mouth 2 (two) times daily as needed for moderate pain. 01/23/19   Mikey College, NP  Diclofenac Sodium 3 % GEL Place 1 application onto the skin 3 (three) times daily as needed (left knee pain). 03/10/18   Mikey College, NP  dicyclomine (BENTYL) 20 MG tablet Take 1 tablet (20 mg total) by mouth 3 (three) times daily as needed for spasms. 03/20/19 03/19/20  Lavonia Drafts, MD  lisinopril (ZESTRIL) 10 MG tablet Take 1 tablet by mouth once daily 03/06/19   Nobie Putnam  J, DO  ondansetron (ZOFRAN) 4 MG tablet Take 1 tablet (4 mg total) by mouth daily as needed for nausea or vomiting. Patient not taking: Reported on 02/28/2019 02/09/19 02/09/20  Merlyn Lot, MD  Oxycodone HCl 10 MG TABS Take 1 tablet (10 mg total) by mouth every 8 (eight) hours as needed for up to 30 days. Must last 30 days. Max 75/month Patient not taking: Reported on 02/28/2019 03/05/19 04/04/19  [name], MD  promethazine-dextromethorphan (PROMETHAZINE-DM) 6.25-15 MG/5ML  syrup TAKE 5 ML BY MOUTH EVERY 6 HOURS AS NEEDED FOR COUGH 02/23/19   [provider]  tiZANidine (ZANAFLEX) 4 MG tablet Take 1 tablet (4 mg total) by mouth every 8 (eight) hours as needed for muscle spasms. 01/23/19   Mikey College, NP     Allergies Patient has no known allergies.  Family History  Problem Relation Age of Onset  . Hypertension Mother   . Alzheimer's disease Father   . Prostate cancer Father 31  . Cancer Sister 17       breast cancer  . Diabetes Sister   . Heart disease Brother        electrical pathways - ongoing workup  . Healthy Brother   . Healthy Son   . Healthy Son   . Healthy Son   . Healthy Son     Social History Social History   Tobacco Use  . Smoking status: Current Every Day Smoker    Packs/day: 2.00    Years: 30.00    Pack years: 60.00    Types: Cigarettes  . Smokeless tobacco: Never Used  Substance Use Topics  . Alcohol use: No    Frequency: Never  . Drug use: No    Review of Systems  Constitutional: No fever/chills Eyes: No visual changes.  ENT: Dry mouth. Cardiovascular: Denies chest pain. Respiratory: Denies shortness of breath. Gastrointestinal: As above Genitourinary: Negative for dysuria. Musculoskeletal: No myalgias Skin: Negative for rash. Neurological: Negative for headaches   ____________________________________________   PHYSICAL EXAM:  VITAL SIGNS: ED Triage Vitals [03/20/19 0714]  Enc Vitals Group     BP (!) 161/82     Pulse Rate 77     Resp 18     Temp 98.3 F (36.8 C)     Temp Source Oral     SpO2 97 %     Weight 104.3 kg (230 lb)     Height 1.854 m (6\' 1" )     Head Circumference      Peak Flow      Pain Score 8     Pain Loc      Pain Edu?      Excl. in [place]?     Constitutional: Alert and oriented.  Eyes: Conjunctivae are normal.   Nose: No congestion/rhinnorhea. Mouth/Throat: Mucous membranes are moist.    Cardiovascular: Normal rate, regular rhythm. Grossly normal heart  sounds.  Good peripheral circulation. Respiratory: Normal respiratory effort.  No retractions. Lungs CTAB. Gastrointestinal: Soft and nontender. No distention.  No CVA tenderness.  Musculoskeletal: No lower extremity tenderness nor edema.  Warm and well perfused Neurologic:  Normal speech and language. No gross focal neurologic deficits are appreciated.  Skin:  Skin is warm, dry and intact. No rash noted. Psychiatric: Mood and affect are normal. Speech and behavior are normal.  ____________________________________________   LABS (all labs ordered are listed, but only abnormal results are displayed)  Labs Reviewed  COMPREHENSIVE METABOLIC PANEL - Abnormal; Notable for the following components:  Result Value   Potassium 3.4 (*)    Glucose, Bld 138 (*)    All other components within normal limits  URINALYSIS, COMPLETE (UACMP) WITH MICROSCOPIC - Abnormal; Notable for the following components:   Color, Urine YELLOW (*)    APPearance HAZY (*)    Specific Gravity, Urine 1.031 (*)    Protein, ur 30 (*)    All other components within normal limits  LIPASE, BLOOD  CBC  TROPONIN I   ____________________________________________  EKG  ED ECG REPORT I, Lavonia Drafts, the attending physician, personally viewed and interpreted this ECG.  Date: 03/20/2019  Rhythm: normal sinus rhythm QRS Axis: normal Intervals: normal ST/T Wave abnormalities: normal Narrative Interpretation: no evidence of acute ischemia  ____________________________________________  RADIOLOGY  None ____________________________________________   PROCEDURES  Procedure(s) performed: No  Procedures   Critical Care performed: No ____________________________________________   INITIAL IMPRESSION / ASSESSMENT AND PLAN / ED COURSE  Pertinent labs & imaging results that were available during my care of the patient were reviewed by me and considered in my medical decision making (see chart for  details).  Patient overall well-appearing and in no acute distress, vital signs are generally reassuring, patient is a history of hypertension.  Complains of lower abdominal discomfort primarily with diarrhea over the last 3 days.  Strongly suspect his diarrhea is related to antibiotic usage.  His abdominal exam is unremarkable he has no tenderness to palpation.  Will check labs give IV fluids and reevaluate   Lab work is overall unremarkable, patient is feeling better after IV fluids.  Urinalysis unremarkable.  I have counseled the patient to DC antibiotics, follow-up with PCP, will refer to GI if necessary    ____________________________________________   FINAL CLINICAL IMPRESSION(S) / ED DIAGNOSES  Final diagnoses:  Diarrhea, unspecified type  Lower abdominal pain        Note:  This document was prepared using Dragon voice recognition software and may include unintentional dictation errors.   Lavonia Drafts, MD 03/20/19 [phone]

## 2019-03-20 NOTE — Telephone Encounter (Signed)
Patient called & needs an appointment for an ED f/u. I have scheduled patient for 04-13-19@ 2:30. He states that's to far out. His significant other Garlon Hatchet is also a patient of Dr Verlin Grills & is willing to give up her 15 min appt for him. Please advise

## 2019-03-21 ENCOUNTER — Telehealth: Payer: Self-pay | Admitting: Family Medicine

## 2019-03-21 DIAGNOSIS — R195 Other fecal abnormalities: Secondary | ICD-10-CM

## 2019-03-21 DIAGNOSIS — F419 Anxiety disorder, unspecified: Secondary | ICD-10-CM

## 2019-03-21 DIAGNOSIS — G479 Sleep disorder, unspecified: Secondary | ICD-10-CM

## 2019-03-21 MED ORDER — ESCITALOPRAM OXALATE 10 MG PO TABS: 10.0000 mg | ORAL_TABLET | Freq: Every day | ORAL | 2 refills | Status: DC

## 2019-03-21 NOTE — Telephone Encounter (Signed)
Patient went to ER has same Symptoms not improving has watery brown diarrhea three time today, has some dizziness, weakness, has LLQ pain and cramping. Patient states," my minds wants to go but my body wants to stay, I feel like I am dying." Patient was tested for COVID twice resulted negative. Patient also has headache and back pain with abdominal cramps. He is taking all Rx med's from ER but not improving his Sxs. As per patient he has no energy left advised patient when to seek medical attention by the mean time will send message to Dr. Raliegh Ip. He understood very well.

## 2019-03-21 NOTE — Telephone Encounter (Signed)
Called patient, spoke with him and his spouse.  Patient was seen in ED 03/20/19.  He should try the Dicyclomine medication prescribed to him for abdominal cramping and discomfort.  He has been on multiple rounds of antibiotics for COPD and Cough treated in past 4-6 weeks.  We do not have any available appointment in next 48 hours.  He has having watery diarrhea - I would recommend a C Diff Stool test. He can pick up the test kit from our office and he can leave the sample, needs to be watery diarrhea to submit the sample.  He has an apt already with [facility] in about 2-3 weeks with Dr Marius Ditch. He should keep this apt and discuss his abdominal / diarrhea symptoms at that time.  They also request previous anxiety medicine from PCP that was rx in 2019, escitalopram, I will re order. He has been increasingly anxious.  Ordered C Diff test, spouse will pick it up tomorrow for him.  Anthony Putnam, DO [facility] 03/21/2019, 4:39 PM

## 2019-03-21 NOTE — Telephone Encounter (Signed)
Please call pt and offer 04/07/2019 for office visit

## 2019-03-22 ENCOUNTER — Encounter: Payer: Self-pay | Admitting: Emergency Medicine

## 2019-03-22 ENCOUNTER — Emergency Department
Admission: EM | Admit: 2019-03-22 | Discharge: 2019-03-22 | Disposition: A | Discharge status: Home / Self Care | Payer: PRIVATE HEALTH INSURANCE | Attending: Emergency Medicine | Admitting: Emergency Medicine

## 2019-03-22 ENCOUNTER — Other Ambulatory Visit: Payer: Self-pay

## 2019-03-22 ENCOUNTER — Emergency Department: Payer: PRIVATE HEALTH INSURANCE

## 2019-03-22 DIAGNOSIS — Z85828 Personal history of other malignant neoplasm of skin: Secondary | ICD-10-CM | POA: Diagnosis not present

## 2019-03-22 DIAGNOSIS — F17210 Nicotine dependence, cigarettes, uncomplicated: Secondary | ICD-10-CM | POA: Diagnosis not present

## 2019-03-22 DIAGNOSIS — R1012 Left upper quadrant pain: Secondary | ICD-10-CM | POA: Diagnosis present

## 2019-03-22 DIAGNOSIS — Z96652 Presence of left artificial knee joint: Secondary | ICD-10-CM | POA: Insufficient documentation

## 2019-03-22 DIAGNOSIS — Z79899 Other long term (current) drug therapy: Secondary | ICD-10-CM | POA: Insufficient documentation

## 2019-03-22 DIAGNOSIS — R109 Unspecified abdominal pain: Secondary | ICD-10-CM

## 2019-03-22 DIAGNOSIS — I10 Essential (primary) hypertension: Secondary | ICD-10-CM | POA: Diagnosis not present

## 2019-03-22 LAB — CBC WITH DIFFERENTIAL/PLATELET
Abs Immature Granulocytes: 0.03 10*3/uL (ref 0.00–0.07)
Basophils Absolute: 0 10*3/uL (ref 0.0–0.1)
Basophils Relative: 0 %
Eosinophils Absolute: 0.1 10*3/uL (ref 0.0–0.5)
Eosinophils Relative: 1 %
HCT: 39.9 % (ref 39.0–52.0)
Hemoglobin: 13.3 g/dL (ref 13.0–17.0)
Immature Granulocytes: 0 %
Lymphocytes Relative: 22 %
Lymphs Abs: 2 10*3/uL (ref 0.7–4.0)
MCH: 30 pg (ref 26.0–34.0)
MCHC: 33.3 g/dL (ref 30.0–36.0)
MCV: 89.9 fL (ref 80.0–100.0)
Monocytes Absolute: 0.6 10*3/uL (ref 0.1–1.0)
Monocytes Relative: 7 %
Neutro Abs: 6.2 10*3/uL (ref 1.7–7.7)
Neutrophils Relative %: 70 %
Platelets: 245 10*3/uL (ref 150–400)
RBC: 4.44 MIL/uL (ref 4.22–5.81)
RDW: 12.6 % (ref 11.5–15.5)
WBC: 9 10*3/uL (ref 4.0–10.5)
nRBC: 0 % (ref 0.0–0.2)

## 2019-03-22 LAB — COMPREHENSIVE METABOLIC PANEL
ALT: 30 U/L (ref 0–44)
AST: 22 U/L (ref 15–41)
Albumin: 4.3 g/dL (ref 3.5–5.0)
Alkaline Phosphatase: 59 U/L (ref 38–126)
Anion gap: 8 (ref 5–15)
BUN: 12 mg/dL (ref 6–20)
CO2: 23 mmol/L (ref 22–32)
Calcium: 9 mg/dL (ref 8.9–10.3)
Chloride: 108 mmol/L (ref 98–111)
Creatinine, Ser: 0.76 mg/dL (ref 0.61–1.24)
GFR calc Af Amer: >60 mL/min (ref >60)
GFR calc non Af Amer: >60 mL/min (ref >60)
Glucose, Bld: 125 mg/dL — ABNORMAL HIGH (ref 70–99)
Potassium: 3.3 mmol/L — ABNORMAL LOW (ref 3.5–5.1)
Sodium: 139 mmol/L (ref 135–145)
Total Bilirubin: 1.2 mg/dL (ref 0.3–1.2)
Total Protein: 7.2 g/dL (ref 6.5–8.1)

## 2019-03-22 LAB — URINALYSIS, COMPLETE (UACMP) WITH MICROSCOPIC
Bacteria, UA: NONE SEEN
Bilirubin Urine: NEGATIVE
Glucose, UA: NEGATIVE mg/dL
Hgb urine dipstick: NEGATIVE
Ketones, ur: NEGATIVE mg/dL
Leukocytes,Ua: NEGATIVE
Nitrite: NEGATIVE
Protein, ur: NEGATIVE mg/dL
Specific Gravity, Urine: 1.033 — ABNORMAL HIGH (ref 1.005–1.030)
Squamous Epithelial / HPF: NONE SEEN (ref 0–5)
pH: 7 (ref 5.0–8.0)

## 2019-03-22 LAB — LIPASE, BLOOD: Lipase: 44 U/L (ref 11–51)

## 2019-03-22 LAB — TROPONIN I: Troponin I: <0.03 ng/mL (ref <0.03)

## 2019-03-22 MED ORDER — LORAZEPAM 2 MG/ML IJ SOLN
1.0000 mg | Freq: Once | INTRAMUSCULAR | Status: AC
  Administered 2019-03-22: 10:00:00 1 mg via INTRAVENOUS
  Filled 2019-03-22: qty 1

## 2019-03-22 MED ORDER — HYOSCYAMINE SULFATE 0.125 MG PO TABS: 0.2500 mg | ORAL_TABLET | Freq: Three times a day (TID) | ORAL | 0 refills | Status: DC | PRN

## 2019-03-22 MED ORDER — PANTOPRAZOLE SODIUM 40 MG PO TBEC: 40.0000 mg | DELAYED_RELEASE_TABLET | Freq: Every day | ORAL | 0 refills | Status: DC

## 2019-03-22 MED ORDER — HYOSCYAMINE SULFATE 0.125 MG SL SUBL
0.2500 mg | SUBLINGUAL_TABLET | Freq: Once | SUBLINGUAL | Status: AC
  Administered 2019-03-22: 0.25 mg via SUBLINGUAL
  Filled 2019-03-22: qty 2

## 2019-03-22 MED ORDER — ALUM & MAG HYDROXIDE-SIMETH 200-200-20 MG/5ML PO SUSP
30.0000 mL | Freq: Once | ORAL | Status: AC
  Administered 2019-03-22: 30 mL via ORAL
  Filled 2019-03-22: qty 30

## 2019-03-22 MED ORDER — LIDOCAINE VISCOUS HCL 2 % MT SOLN
15.0000 mL | Freq: Once | OROMUCOSAL | Status: AC
  Administered 2019-03-22: 15 mL via ORAL
  Filled 2019-03-22: qty 15

## 2019-03-22 MED ORDER — IOHEXOL 300 MG/ML  SOLN
100.0000 mL | Freq: Once | INTRAMUSCULAR | Status: AC | PRN
  Administered 2019-03-22: 100 mL via INTRAVENOUS

## 2019-03-22 NOTE — ED Notes (Signed)
Pt counseled on importance of not driving himself home after taking IV Lorazepam. PT states he has a driver to take him home.

## 2019-03-22 NOTE — ED Notes (Signed)
Blood drawn.

## 2019-03-22 NOTE — Discharge Instructions (Signed)
I would recommend that you continue taking over-the-counter probiotics at least once daily.  I have adjusted your medications.  Is important to only take what is prescribed.  I suspect many of her symptoms could be from side effects of other medications.  The new medications are prescribed are similar to what I gave you in the emergency department and are designed to help with acid reflux, in the event of a possible ulcer, as well as cramping.  Try to avoid excess dairy and yogurt if you are already on Probiotics

## 2019-03-22 NOTE — ED Provider Notes (Signed)
[facility] Emergency Department Provider Note  ____________________________________________   First MD Initiated Contact with Patient 03/22/19 0703     (approximate)  I have reviewed the triage vital signs and the nursing notes.   HISTORY  Chief Complaint Chest Pain    HPI Anthony Nguyen is a 63 y.o. male here with ongoing abdominal pain.  The patient has been seen multiple times for this.  He was just seen on 6/2 for similar symptoms.  He reports that he was recently treated for prostatitis on a 20-day course of antibiotics but is also treated for COPD exacerbation.  He states that since then, he has had persistent left upper quadrant left lower quadrant abdominal pain with watery diarrhea.  He has had multiple evaluations for this, including CT scan approximately a month ago, which was unremarkable.  He has not had any fevers.  He has had generalized weakness.  He has had poor p.o. intake.  He reports he is also been under increased stress and anxiety.  He was prescribed Bentyl yesterday for the pain, which he has taken several times without significant relief.  Ongoing sharp, colicky, left upper quadrant pain radiating to his back, so he subsequent presents again for evaluation.  He is currently scheduled to see GI specialist in the next several weeks.  Denies any fevers or chills.  No weight loss.  No history of C. difficile, but has an outpatient C. difficile ordered.  Pain is worse with movement and palpation.  No alleviating factors.         Past Medical History:  Diagnosis Date  . Allergy   . Arthritis    osteoarthritis  . Cancer ([place])    melanoma skin cancer face  . Chronic back pain    goes to Dr. Holley Raring at the pain clinic  . Current every day smoker   . Diverticulosis   . GERD (gastroesophageal reflux disease)   . H/O diverticulitis of colon   . Restless leg syndrome   . Sciatic pain     Patient Active Problem List   Diagnosis Date  Noted  . Essential hypertension 11/28/2018  . Benign prostatic hyperplasia with urinary frequency 04/04/2018  . Lumbar radiculopathy 12/29/2017  . Lumbar degenerative disc disease 12/29/2017  . Lumbar foraminal stenosis 12/29/2017  . History of left knee replacement 12/29/2017  . Primary osteoarthritis of both knees 12/29/2017  . Chronic pain syndrome 12/29/2017  . Primary osteoarthritis of left knee 10/14/2017  . Chronic nasal congestion 11/24/2013  . Benign paroxysmal positional vertigo 11/24/2013  . Sleep disturbance 08/17/2013  . Allergy 08/17/2013  . GERD (gastroesophageal reflux disease) 08/17/2013  . Tobacco abuse counseling 08/17/2013  . Ringworm 08/17/2013    Past Surgical History:  Procedure Laterality Date  . JOINT REPLACEMENT Left 10/14/2018   knee  . skin cancer removal     face  . TONSILLECTOMY AND ADENOIDECTOMY    . TOTAL KNEE ARTHROPLASTY Left 10/14/2017   Procedure: TOTAL KNEE ARTHROPLASTY;  Surgeon: Hessie Knows, MD;  Location: [facility] ORS;  Service: Orthopedics;  Laterality: Left;    Prior to Admission medications   Medication Sig Start Date End Date Taking? Authorizing Provider  albuterol (VENTOLIN HFA) 108 (90 Base) MCG/ACT inhaler Inhale 2 puffs into the lungs every 6 (six) hours as needed for wheezing or shortness of breath. 02/09/19   Merlyn Lot, MD  beclomethasone (QVAR) 40 MCG/ACT inhaler Inhale into the lungs. 02/23/19   [provider]  Diclofenac Sodium 3 %  GEL Place 1 application onto the skin 3 (three) times daily as needed (left knee pain). 03/10/18   Mikey College, NP  escitalopram (LEXAPRO) 10 MG tablet Take 1 tablet (10 mg total) by mouth daily. 03/21/19   Karamalegos, Devonne Doughty, DO  hyoscyamine (LEVSIN) 0.125 MG tablet Take 2 tablets (0.25 mg total) by mouth every 8 (eight) hours as needed for cramping. 03/22/19   Duffy Bruce, MD  lisinopril (ZESTRIL) 10 MG tablet Take 1 tablet by mouth once daily 03/06/19   Karamalegos,  Devonne Doughty, DO  pantoprazole (PROTONIX) 40 MG tablet Take 1 tablet (40 mg total) by mouth daily for 14 days. 03/22/19 04/05/19  Duffy Bruce, MD  promethazine-dextromethorphan (PROMETHAZINE-DM) 6.25-15 MG/5ML syrup TAKE 5 ML BY MOUTH EVERY 6 HOURS AS NEEDED FOR COUGH 02/23/19   [provider]  tiZANidine (ZANAFLEX) 4 MG tablet Take 1 tablet (4 mg total) by mouth every 8 (eight) hours as needed for muscle spasms. 01/23/19   Mikey College, NP    Allergies Patient has no known allergies.  Family History  Problem Relation Age of Onset  . Hypertension Mother   . Alzheimer's disease Father   . Prostate cancer Father 23  . Cancer Sister 87       breast cancer  . Diabetes Sister   . Heart disease Brother        electrical pathways - ongoing workup  . Healthy Brother   . Healthy Son   . Healthy Son   . Healthy Son   . Healthy Son     Social History Social History   Tobacco Use  . Smoking status: Current Every Day Smoker    Packs/day: 2.00    Years: 30.00    Pack years: 60.00    Types: Cigarettes  . Smokeless tobacco: Never Used  Substance Use Topics  . Alcohol use: No    Frequency: Never  . Drug use: No    Review of Systems Review of Systems  Constitutional: Positive for chills. Negative for fatigue and fever.  HENT: Negative for congestion and rhinorrhea.   Eyes: Negative for visual disturbance.  Respiratory: Negative for cough and shortness of breath.   Gastrointestinal: Positive for abdominal pain, diarrhea, nausea and vomiting.  Genitourinary: Negative for dysuria and flank pain.  Skin: Negative for rash and wound.  Neurological: Positive for weakness. Negative for light-headedness.  All other systems reviewed and are negative.    ____________________________________________  PHYSICAL EXAM:  VITAL SIGNS: ED Triage Vitals [03/22/19 0642]  Enc Vitals Group     BP      Pulse Rate 72     Resp 18     Temp 98.4 F (36.9 C)     Temp Source Oral      SpO2 97 %     Weight 225 lb (102.1 kg)     Height 6\' 1"  (1.854 m)     Head Circumference      Peak Flow      Pain Score      Pain Loc      Pain Edu?      Excl. in [place]?     Physical Exam  Constitutional: He is oriented to person, place, and time and well-developed, well-nourished, and in no distress. No distress.  HENT:  Head: Normocephalic and atraumatic.  Mouth/Throat: No oropharyngeal exudate.  Mildly dry mucous membranes  Eyes: Pupils are equal, round, and reactive to light.  Neck: Neck supple.  Cardiovascular: Normal rate, regular rhythm,  normal heart sounds and intact distal pulses. Exam reveals no friction rub.  No murmur heard. Pulmonary/Chest: Effort normal. No respiratory distress. He has no wheezes. He has no rales.  Abdominal: Soft. He exhibits no distension. There is abdominal tenderness (Mild, left upper quadrant). There is no rebound and no guarding.  Musculoskeletal:        General: No edema.  Neurological: He is alert and oriented to person, place, and time.  Skin: Skin is warm. No rash noted.  Psychiatric: Affect normal.  Nursing note and vitals reviewed.   ____________________________________________   LABS (all labs ordered are listed, but only abnormal results are displayed)  Labs Reviewed  COMPREHENSIVE METABOLIC PANEL - Abnormal; Notable for the following components:      Result Value   Potassium 3.3 (*)    Glucose, Bld 125 (*)    All other components within normal limits  URINALYSIS, COMPLETE (UACMP) WITH MICROSCOPIC - Abnormal; Notable for the following components:   Color, Urine YELLOW (*)    APPearance CLEAR (*)    Specific Gravity, Urine 1.033 (*)    All other components within normal limits  CBC WITH DIFFERENTIAL/PLATELET  LIPASE, BLOOD  TROPONIN I    ____________________________________________  ED ECG REPORT I, Evonnie Pat, the attending physician, personally viewed and interpreted this ECG.   EKG  Interpretation  Date/Time:  Wednesday March 22 2019 06:45:52 EDT Ventricular Rate:  59 PR Interval:  158 QRS Duration: 96 QT Interval:  432 QTC Calculation: 427 R Axis:   48 Text Interpretation:  Sinus bradycardia Otherwise normal ECG When compared with ECG of 20-Mar-2019 07:18, No significant change was found Confirmed by Duffy Bruce [phone]) on 03/22/2019 7:03:28 AM         ________________________________________  RADIOLOGY All imaging, including plain films, CT scans, and ultrasounds, independently reviewed by me, and interpretations confirmed via formal radiology reads.  ED MD interpretation: CT scan shows no acute abnormality.  See no evidence of obstruction or large mass.  No apparent inflammation of the bowel noted.  Official radiology report(s): Ct Abdomen Pelvis W Contrast  Result Date: 03/22/2019 CLINICAL DATA:  63 year old with nausea and vomiting. Abdominal pain. Evaluate for diverticulitis or colitis. EXAM: CT ABDOMEN AND PELVIS WITH CONTRAST TECHNIQUE: Multidetector CT imaging of the abdomen and pelvis was performed using the standard protocol following bolus administration of intravenous contrast. CONTRAST:  146mL OMNIPAQUE IOHEXOL 300 MG/ML  SOLN COMPARISON:  CT 04/06/2018 FINDINGS: Lower chest: Stable 5 mm nodule in the left lower lobe on sequence 4, image 6. Subtle punctate density in the left lower lobe on image 1 is similar to the previous examination. Scarring or atelectasis along the posterior left lung base. No large pleural effusions. Left pulmonary nodules were present on the CT from 11/07/2014 and suggestive for benign findings. Hepatobiliary: Stable focal fat near the falciform ligament. Normal appearance of the liver without a suspicious lesion. Portal venous system is patent. Normal appearance of the gallbladder. No biliary dilatation. Pancreas: Unremarkable. No pancreatic ductal dilatation or surrounding inflammatory changes. Spleen: Normal in size without focal  abnormality. Adrenals/Urinary Tract: Normal adrenal glands. Small hypodensities in the kidneys that are too small to definitively characterize. No suspicious renal lesions. Evidence for small left posterior bladder diverticulum and similar to the prior examination. Negative for hydronephrosis. Stomach/Bowel: Stomach is within normal limits. Appendix appears normal. No evidence of bowel wall thickening, distention, or inflammatory changes. Vascular/Lymphatic: Atherosclerotic calcifications involving the abdominal aorta and iliac arteries without aneurysm. No abdominopelvic lymphadenopathy. Reproductive:  Stable prostate calcifications. Other: Negative for ascites. Negative for free air. Umbilical hernia containing fat. Musculoskeletal: Stable sclerosis involving the right pubic bone. Old posterior fractures involving the left ninth and tenth ribs. Old fracture involving the right ninth rib. Evidence for an old right anterior rib fracture. 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 IMPRESSION: 1. No acute abnormality in the abdomen or pelvis. 2. Small bladder diverticulum. 3. Small pulmonary nodules that have minimally changed since 2016 and compatible with benign findings. 4. Hypodensities in the kidneys that are too small to definitively characterize. 5.  Aortic Atherosclerosis (ICD10-I70.0). Electronically Signed   By: Markus Daft M.D.   On: 03/22/2019 09:14    ____________________________________________  PROCEDURES   Procedure(s) performed (including Critical Care):  Procedures  ____________________________________________  INITIAL IMPRESSION / MDM / [place] / ED COURSE  As part of my medical decision making, I reviewed the following data within the electronic MEDICAL RECORD NUMBER Notes from prior ED visits and [place] Controlled Substance Database      *[place] Retana Eisenbeis was evaluated in Emergency Department on 03/22/2019 for the symptoms described in the history of  present illness. He was evaluated in the context of the global COVID-19 pandemic, which necessitated consideration that the patient might be at risk for infection with the SARS-CoV-2 virus that causes COVID-19. Institutional protocols and algorithms that pertain to the evaluation of patients at risk for COVID-19 are in a state of rapid change based on information released by regulatory bodies including the CDC and federal and state organizations. These policies and algorithms were followed during the patient's care in the ED.  Some ED evaluations and interventions may be delayed as a result of limited staffing during the pandemic.*   Clinical Course as of Mar 21 1002  Wed Mar 22, 2019  0753 Mild hypokalemia likely secondary to diarrhea.  Be replaced.  Otherwise, renal function and LFTs normal.  Comprehensive metabolic panel(!) [CI]  4098 Reassuring with normal EKG and constant pain, doubt ACS.  Troponin I - ONCE - STAT [CI]  0754 No ischemic changes  EKG 12-Lead [CI]    Clinical Course User Index [CI] Duffy Bruce, MD    Medical Decision Making: 63 yo M here with ongoing abdominal pain, diarrhea (though no diarrhea today) in setting of recent prolonged ABX use. DDx includes ABX-associated diarrhea, IBS, C. Diff colitis, PUD/gastritis, less likely diverticulitis given recent prolonged ABX use. Labs reviewed as above and are overall reassuring, but given persistent pain CT scan obtained. CT reviewed and shows no acute abnormality.  He feels markedly improved after receiving hyoscyamine here.  Suspect there could be a component of IBS versus ongoing, noninfectious colitis in the setting of antibiotic use.  I also suspect there could be a component of increasing anxiety related to this.  He was given Ativan with excellent relief.  Given reassuring labs, CT scan, I have a low concern for infectious abnormality.  He will have a C. difficile sent by his PCP but low concern clinically with normal white  blood cell count.  He is unable to provide a stool sample here.  Will adjust his medications, encourage adherence with his new antianxiety regimen, and reiterated the importance of GI follow-up..  ____________________________________________  FINAL CLINICAL IMPRESSION(S) / ED DIAGNOSES  Final diagnoses:  LUQ abdominal pain  Abdominal cramping     MEDICATIONS GIVEN DURING THIS VISIT:  Medications  alum & mag hydroxide-simeth (MAALOX/MYLANTA) 200-200-20 MG/5ML suspension 30 mL (30 mLs  Oral Given 03/22/19 0753)    And  lidocaine (XYLOCAINE) 2 % viscous mouth solution 15 mL (15 mLs Oral Given 03/22/19 0753)  hyoscyamine (LEVSIN SL) SL tablet 0.25 mg (0.25 mg Sublingual Given 03/22/19 0752)  iohexol (OMNIPAQUE) 300 MG/ML solution 100 mL (100 mLs Intravenous Contrast Given 03/22/19 0829)  LORazepam (ATIVAN) injection 1 mg (1 mg Intravenous Given 03/22/19 0943)     ED Discharge Orders         Ordered    hyoscyamine (LEVSIN) 0.125 MG tablet  Every 8 hours PRN     03/22/19 1002    pantoprazole (PROTONIX) 40 MG tablet  Daily     03/22/19 1002           Note:  This document was prepared using Dragon voice recognition software and may include unintentional dictation errors.   Duffy Bruce, MD 03/22/19 1004

## 2019-03-22 NOTE — ED Notes (Signed)
Pt otf for CT

## 2019-03-22 NOTE — ED Triage Notes (Signed)
Pt c/o left sided chest pain that radiates into upper abdomen and left shoulder. Pt describes pain as pressure. Pt seen in ED on Monday for similar pain. Pt reports SOB.

## 2019-03-22 NOTE — ED Notes (Signed)
Pt resting in bed, speaking with this RN in NAD, no SOB noted. A&Ox4

## 2019-03-22 NOTE — ED Notes (Signed)
Pt aware urine sample is needed 

## 2019-03-22 NOTE — ED Notes (Signed)
Pt reports pain 4/10 in left abdominal area after receiving Lorazepam

## 2019-03-22 NOTE — Telephone Encounter (Signed)
Pt lefty vm he missed someone's call

## 2019-03-22 NOTE — ED Notes (Signed)
Pt states abdominal pain is much better, has improved from 10/10 to 5/10

## 2019-03-27 ENCOUNTER — Telehealth: Payer: Self-pay | Admitting: *Deleted

## 2019-03-27 NOTE — Telephone Encounter (Signed)
Returned patient's call for pre appointment assessment.

## 2019-03-28 ENCOUNTER — Encounter: Payer: Self-pay | Admitting: Student in an Organized Health Care Education/Training Program

## 2019-03-28 ENCOUNTER — Other Ambulatory Visit: Payer: Self-pay

## 2019-03-28 ENCOUNTER — Ambulatory Visit
Payer: PRIVATE HEALTH INSURANCE | Attending: Student in an Organized Health Care Education/Training Program | Admitting: Student in an Organized Health Care Education/Training Program

## 2019-03-28 DIAGNOSIS — M5416 Radiculopathy, lumbar region: Secondary | ICD-10-CM | POA: Diagnosis not present

## 2019-03-28 DIAGNOSIS — M48061 Spinal stenosis, lumbar region without neurogenic claudication: Secondary | ICD-10-CM | POA: Diagnosis not present

## 2019-03-28 DIAGNOSIS — G894 Chronic pain syndrome: Secondary | ICD-10-CM | POA: Diagnosis not present

## 2019-03-28 MED ORDER — OXYCODONE HCL 10 MG PO TABS: 10.0000 mg | ORAL_TABLET | Freq: Three times a day (TID) | ORAL | 0 refills | Status: DC | PRN

## 2019-03-28 MED ORDER — OXYCODONE HCL 10 MG PO TABS: 10.0000 mg | ORAL_TABLET | Freq: Three times a day (TID) | ORAL | 0 refills | Status: AC | PRN

## 2019-03-28 NOTE — Progress Notes (Signed)
Pain Management Virtual Encounter Note - Virtual Visit via [place] (real-time audio visits between healthcare provider and patient).   Patient's Phone No. & Preferred Pharmacy:  [phone] (home); [phone] (mobile); (Preferred) [phone] [email or web address]  [facility] - [facility], [place] [address] Phone: [phone] Fax: [place], [place] [address] [place] [address] Phone: [phone] Fax: [place] [address] ([address]), [place]) [place] [address] Phone: [phone] Fax: [phone]    Pre-screening note:  Our staff contacted [name] and offered him an "in person", "face-to-face" appointment versus a telephone encounter. He indicated preferring the telephone encounter, at this time.   Reason for Virtual Visit: COVID-19*  Social distancing based on CDC and AMA recommendations.   I contacted Antoin Dargis Tarnowski on 03/28/2019 via video conference.      I clearly identified myself as [name], MD. I verified that I was speaking with the correct person using two identifiers (Name: Anthony Nguyen, and date of birth: April 04, 1956).  Advanced Informed Consent I sought verbal advanced consent from [name] for virtual visit interactions. I informed Anthony Nguyen of possible security and privacy concerns, risks, and limitations associated with providing "not-in-person" medical evaluation and management services. I also informed Anthony Nguyen of the availability of "in-person" appointments. Finally, I informed him that there would be a charge for the virtual visit and that he could be  personally, fully or partially, financially responsible for it. Anthony Nguyen expressed understanding and agreed to proceed.   Historic Elements   Anthony Nguyen is a 63 y.o. year old, male patient evaluated today after his last encounter by our practice on 03/27/2019. Anthony Nguyen  has a past medical history of Allergy, Arthritis, Cancer ([place]), Chronic back pain, Current every day smoker, Diverticulosis, GERD (gastroesophageal reflux disease), H/O diverticulitis of colon, Restless leg syndrome, and Sciatic pain. 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 Anthony Nguyen has a current medication list which includes the following prescription(s): albuterol, beclomethasone, diclofenac sodium, escitalopram, hyoscyamine, lisinopril, pantoprazole, tizanidine, oxycodone hcl, oxycodone hcl, and promethazine-dextromethorphan. He  reports that he has been smoking cigarettes. He has a 60.00 pack-year smoking history. He has never used smokeless tobacco. He reports that he does not drink alcohol or use drugs. Anthony Nguyen has No Known Allergies.   HPI  Today, he is being contacted for medication management.  Patient is endorsing right-sided low back pain with radiation to his right posterior lateral thigh and lateral leg.  He wants to repeat lumbar epidural steroid injection.  Previous one was 12/28/2018.  This did provide him with significant pain relief.  Patient has been dealing with GI viral illness.  He has been having diarrhea.  I instructed the patient to continue to hydrate himself.  He is COVID-19 negative.  We will get him scheduled for right L4-L5 ESI.  Refill medications as below.  No change in dose.  Pharmacotherapy Assessment   03/06/2019  1   01/24/2019  Oxycodone Hcl 10 MG Tablet  75.00 30 Bi Lat   [id number]   [place] ([id number])   0  37.50 MME  Comm Ins   [place]     Monitoring: Pharmacotherapy: No side-effects or adverse reactions  reported. [place] PMP: PDMP reviewed during this encounter.       Compliance: No  problems identified. Effectiveness: Clinically acceptable. Plan: Refer to "POC".  Pertinent Labs   SAFETY SCREENING Profile Lab Results  Component Value Date   SARSCOV2NAA NOT DETECTED 02/09/2019   COVIDSOURCE NASOPHARYNGEAL 02/09/2019   STAPHAUREUS POSITIVE (A) 09/29/2017   MRSAPCR NEGATIVE 09/29/2017   Renal Function Lab Results  Component Value Date   BUN 12 03/22/2019   CREATININE 0.76 03/22/2019   GFRAA >60 03/22/2019   GFRNONAA >60 03/22/2019   Hepatic Function Lab Results  Component Value Date   AST 22 03/22/2019   ALT 30 03/22/2019   ALBUMIN 4.3 03/22/2019   UDS Summary  Date Value Ref Range Status  12/28/2018 FINAL  Final    Comment:    ==================================================================== TOXASSURE SELECT 13 (MW) ==================================================================== Test                             Result       Flag       Units Drug Present and Declared for Prescription Verification   Oxycodone                      314          EXPECTED   ng/mg creat   Oxymorphone                    331          EXPECTED   ng/mg creat   Noroxycodone                   1838         EXPECTED   ng/mg creat   Noroxymorphone                 130          EXPECTED   ng/mg creat    Sources of oxycodone are scheduled prescription medications.    Oxymorphone, noroxycodone, and noroxymorphone are expected    metabolites of oxycodone. Oxymorphone is also available as a    scheduled prescription medication. ==================================================================== Test                      Result    Flag   Units      Ref Range   Creatinine              102              mg/dL      >=20 ==================================================================== Declared Medications:  The flagging and interpretation on this report are based on the  following declared medications.  Unexpected results may arise from  inaccuracies in the declared  medications.  **Note: The testing scope of this panel includes these medications:  Oxycodone  **Note: The testing scope of this panel does not include following  reported medications:  Diclofenac  Lisinopril  Melatonin ==================================================================== For clinical consultation, please call [phone]. ====================================================================    Note: Above Lab results reviewed.  Recent imaging  CT ABDOMEN PELVIS W CONTRAST CLINICAL DATA:  63 year old with nausea and vomiting. Abdominal pain. Evaluate for diverticulitis or colitis.  EXAM: CT ABDOMEN AND PELVIS WITH CONTRAST  TECHNIQUE: Multidetector CT imaging of the abdomen and pelvis was performed using the standard protocol following bolus administration of intravenous contrast.  CONTRAST:  123mL OMNIPAQUE IOHEXOL 300 MG/ML  SOLN  COMPARISON:  CT 04/06/2018  FINDINGS: Lower chest: Stable 5 mm nodule in the left lower lobe on sequence 4, image 6. Subtle punctate density in the left lower lobe on image 1 is similar to the previous examination. Scarring or atelectasis along the posterior left lung base. No large pleural effusions. Left pulmonary nodules were present on the CT from 11/07/2014 and suggestive for benign findings.  Hepatobiliary: Stable focal fat near the falciform ligament. Normal appearance of the liver without a suspicious lesion. Portal venous system is patent. Normal appearance of the gallbladder. No biliary dilatation.  Pancreas: Unremarkable. No pancreatic ductal dilatation or surrounding inflammatory changes.  Spleen: Normal in size without focal abnormality.  Adrenals/Urinary Tract: Normal adrenal glands. Small hypodensities in the kidneys that are too small to definitively characterize. No suspicious renal lesions. Evidence for small left posterior bladder diverticulum and similar to the prior examination. Negative  for hydronephrosis.  Stomach/Bowel: Stomach is within normal limits. Appendix appears normal. No evidence of bowel wall thickening, distention, or inflammatory changes.  Vascular/Lymphatic: Atherosclerotic calcifications involving the abdominal aorta and iliac arteries without aneurysm. No abdominopelvic lymphadenopathy.  Reproductive: Stable prostate calcifications.  Other: Negative for ascites. Negative for free air. Umbilical hernia containing fat.  Musculoskeletal: Stable sclerosis involving the right pubic bone. Old posterior fractures involving the left ninth and tenth ribs. Old fracture involving the right ninth rib. Evidence for an old right anterior rib fracture. 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  IMPRESSION: 1. No acute abnormality in the abdomen or pelvis. 2. Small bladder diverticulum. 3. Small pulmonary nodules that have minimally changed since 2016 and compatible with benign findings. 4. Hypodensities in the kidneys that are too small to definitively characterize. 5.  Aortic Atherosclerosis (ICD10-I70.0).  Electronically Signed   By: Markus Daft M.D.   On: 03/22/2019 09:14  Assessment  The primary encounter diagnosis was Lumbar radiculopathy. Diagnoses of Lumbar foraminal stenosis and Chronic pain syndrome were also pertinent to this visit.  Plan of Care  I am having Anthony Nguyen start on Oxycodone HCl and Oxycodone HCl. I am also having him maintain his Diclofenac Sodium, tiZANidine, albuterol, beclomethasone, promethazine-dextromethorphan, lisinopril, escitalopram, hyoscyamine, and pantoprazole.  Patient's last visit with me was on 01/24/2019.  At that time for the patient's persistent left knee pain, left genicular nerve block was scheduled.  That procedure was postponed due to COVID-19.  Today the patient is endorsing right-sided low back pain with radiation into his right posterior lateral and anterolateral leg.  He  would like to repeat lumbar epidural steroid injection.  Previous one was done 12/28/2018.  Risks and benefits reviewed and patient like to proceed.  Plan for right L4-L5 ESI without sedation.  This can be done either before or after the patient's diagnostic left knee genicular nerve block.  Which ever is most painful and what ever the patient prefers.  Also refill patient's oxycodone as below.  PMP checked and appropriate.  Plan: -Proceed with genicular nerve block that was ordered in early April -Order for right L4-L5 epidural steroid injection for lumbar radicular pain -Refill of oxycodone as below.  Prescription for 2 months -Continue tizanidine as prescribed.  No refill needed -Continue Voltaren gel as prescribed.    Pharmacotherapy (Medications Ordered): Meds ordered this encounter  Medications  . Oxycodone HCl 10 MG TABS    Sig: Take 1 tablet (10 mg total) by mouth every 8 (eight) hours as needed for up to 30 days. Must last 30 days.  Dispense:  75 tablet    Refill:  0    Chronic Pain. (STOP Act - Not applicable). Fill one day early if closed on scheduled refill date. Max 75/month.  . Oxycodone HCl 10 MG TABS    Sig: Take 1 tablet (10 mg total) by mouth every 8 (eight) hours as needed for up to 30 days. Must last 30 days.    Dispense:  75 tablet    Refill:  0    Chronic Pain. (STOP Act - Not applicable). Fill one day early if closed on scheduled refill date. Max 75/month.   Orders:  Orders Placed This Encounter  Procedures  . Lumbar Epidural Injection    Standing Status:   Future    Standing Expiration Date:   04/27/2019    Scheduling Instructions:     Procedure: Interlaminar Lumbar Epidural Steroid injection (LESI) : RIGHT L4-L5 ESI           Laterality: Midline     Sedation: without     Timeframe: ASAA    Order Specific Question:   Where will this procedure be performed?    Answer:   [facility]   Follow-up plan:   Return for Procedure: Right L4-L5 ESI  without sedation.    I discussed the assessment and treatment plan with the patient. The patient was provided an opportunity to ask questions and all were answered. The patient agreed with the plan and demonstrated an understanding of the instructions.  Patient advised to call back or seek an in-person evaluation if the symptoms or condition worsens.  Total duration of non-face-to-face encounter: 25 minutes.  Note by: [name], MD Date: 03/28/2019; Time: 10:19 AM  Note: This dictation was prepared with Dragon dictation. Any transcriptional errors that may result from this process are unintentional.  Disclaimer:  * Given the special circumstances of the COVID-19 pandemic, the federal government has announced that the Office for Civil Rights (OCR) will exercise its enforcement discretion and will not impose penalties on physicians using telehealth in the event of noncompliance with regulatory requirements under the [place] and [place] (HIPAA) in connection with the good faith provision of telehealth during the IZTIW-58 national public health emergency. ([place])

## 2019-04-03 ENCOUNTER — Telehealth: Payer: Self-pay | Admitting: *Deleted

## 2019-04-06 ENCOUNTER — Ambulatory Visit: Payer: PRIVATE HEALTH INSURANCE | Admitting: Gastroenterology

## 2019-04-07 ENCOUNTER — Ambulatory Visit: Payer: PRIVATE HEALTH INSURANCE | Admitting: Gastroenterology

## 2019-04-12 ENCOUNTER — Other Ambulatory Visit: Payer: PRIVATE HEALTH INSURANCE | Attending: Student in an Organized Health Care Education/Training Program

## 2019-04-13 ENCOUNTER — Ambulatory Visit: Payer: PRIVATE HEALTH INSURANCE | Admitting: Gastroenterology

## 2019-04-17 ENCOUNTER — Ambulatory Visit: Payer: PRIVATE HEALTH INSURANCE | Admitting: Student in an Organized Health Care Education/Training Program

## 2019-04-18 ENCOUNTER — Emergency Department: Payer: No Typology Code available for payment source

## 2019-04-18 ENCOUNTER — Emergency Department
Admission: EM | Admit: 2019-04-18 | Discharge: 2019-04-18 | Disposition: A | Discharge status: Home / Self Care | Payer: No Typology Code available for payment source | Attending: Emergency Medicine | Admitting: Emergency Medicine

## 2019-04-18 ENCOUNTER — Other Ambulatory Visit: Payer: Self-pay

## 2019-04-18 DIAGNOSIS — F17210 Nicotine dependence, cigarettes, uncomplicated: Secondary | ICD-10-CM | POA: Diagnosis not present

## 2019-04-18 DIAGNOSIS — R0789 Other chest pain: Secondary | ICD-10-CM | POA: Diagnosis not present

## 2019-04-18 DIAGNOSIS — Z79899 Other long term (current) drug therapy: Secondary | ICD-10-CM | POA: Diagnosis not present

## 2019-04-18 DIAGNOSIS — Y939 Activity, unspecified: Secondary | ICD-10-CM | POA: Insufficient documentation

## 2019-04-18 DIAGNOSIS — Z96652 Presence of left artificial knee joint: Secondary | ICD-10-CM | POA: Diagnosis not present

## 2019-04-18 DIAGNOSIS — I10 Essential (primary) hypertension: Secondary | ICD-10-CM | POA: Insufficient documentation

## 2019-04-18 DIAGNOSIS — Y929 Unspecified place or not applicable: Secondary | ICD-10-CM | POA: Insufficient documentation

## 2019-04-18 DIAGNOSIS — Y999 Unspecified external cause status: Secondary | ICD-10-CM | POA: Insufficient documentation

## 2019-04-18 DIAGNOSIS — X58XXXA Exposure to other specified factors, initial encounter: Secondary | ICD-10-CM | POA: Insufficient documentation

## 2019-04-18 DIAGNOSIS — S59901A Unspecified injury of right elbow, initial encounter: Secondary | ICD-10-CM | POA: Diagnosis not present

## 2019-04-18 DIAGNOSIS — S0990XA Unspecified injury of head, initial encounter: Secondary | ICD-10-CM | POA: Insufficient documentation

## 2019-04-18 DIAGNOSIS — R0781 Pleurodynia: Secondary | ICD-10-CM

## 2019-04-18 LAB — CBC WITH DIFFERENTIAL/PLATELET
Abs Immature Granulocytes: 0.01 10*3/uL (ref 0.00–0.07)
Basophils Absolute: 0.1 10*3/uL (ref 0.0–0.1)
Basophils Relative: 1 %
Eosinophils Absolute: 0.4 10*3/uL (ref 0.0–0.5)
Eosinophils Relative: 5 %
HCT: 41 % (ref 39.0–52.0)
Hemoglobin: 13.1 g/dL (ref 13.0–17.0)
Immature Granulocytes: 0 %
Lymphocytes Relative: 23 %
Lymphs Abs: 1.9 10*3/uL (ref 0.7–4.0)
MCH: 30.1 pg (ref 26.0–34.0)
MCHC: 32 g/dL (ref 30.0–36.0)
MCV: 94.3 fL (ref 80.0–100.0)
Monocytes Absolute: 0.6 10*3/uL (ref 0.1–1.0)
Monocytes Relative: 7 %
Neutro Abs: 5.1 10*3/uL (ref 1.7–7.7)
Neutrophils Relative %: 64 %
Platelets: 170 10*3/uL (ref 150–400)
RBC: 4.35 MIL/uL (ref 4.22–5.81)
RDW: 12.1 % (ref 11.5–15.5)
WBC: 8 10*3/uL (ref 4.0–10.5)
nRBC: 0 % (ref 0.0–0.2)

## 2019-04-18 LAB — URINE DRUG SCREEN, QUALITATIVE (ARMC ONLY)
Amphetamines, Ur Screen: NOT DETECTED
Barbiturates, Ur Screen: NOT DETECTED
Benzodiazepine, Ur Scrn: POSITIVE — AB
Cannabinoid 50 Ng, Ur ~~LOC~~: NOT DETECTED
Cocaine Metabolite,Ur ~~LOC~~: NOT DETECTED
MDMA (Ecstasy)Ur Screen: NOT DETECTED
Methadone Scn, Ur: NOT DETECTED
Opiate, Ur Screen: NOT DETECTED
Phencyclidine (PCP) Ur S: NOT DETECTED
Tricyclic, Ur Screen: NOT DETECTED

## 2019-04-18 LAB — COMPREHENSIVE METABOLIC PANEL
ALT: 15 U/L (ref 0–44)
AST: 13 U/L — ABNORMAL LOW (ref 15–41)
Albumin: 4.6 g/dL (ref 3.5–5.0)
Alkaline Phosphatase: 66 U/L (ref 38–126)
Anion gap: 11 (ref 5–15)
BUN: 13 mg/dL (ref 6–20)
CO2: 24 mmol/L (ref 22–32)
Calcium: 9.6 mg/dL (ref 8.9–10.3)
Chloride: 108 mmol/L (ref 98–111)
Creatinine, Ser: 0.86 mg/dL (ref 0.61–1.24)
GFR calc Af Amer: >60 mL/min (ref >60)
GFR calc non Af Amer: >60 mL/min (ref >60)
Glucose, Bld: 102 mg/dL — ABNORMAL HIGH (ref 70–99)
Potassium: 3.7 mmol/L (ref 3.5–5.1)
Sodium: 143 mmol/L (ref 135–145)
Total Bilirubin: 0.6 mg/dL (ref 0.3–1.2)
Total Protein: 7.4 g/dL (ref 6.5–8.1)

## 2019-04-18 LAB — URINALYSIS, COMPLETE (UACMP) WITH MICROSCOPIC
Bacteria, UA: NONE SEEN
Bilirubin Urine: NEGATIVE
Glucose, UA: NEGATIVE mg/dL
Hgb urine dipstick: NEGATIVE
Ketones, ur: NEGATIVE mg/dL
Leukocytes,Ua: NEGATIVE
Nitrite: NEGATIVE
Protein, ur: NEGATIVE mg/dL
Specific Gravity, Urine: 1.025 (ref 1.005–1.030)
pH: 6 (ref 5.0–8.0)

## 2019-04-18 LAB — ETHANOL: Alcohol, Ethyl (B): <10 mg/dL (ref <10)
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
## 2019-04-18 NOTE — ED Notes (Signed)
Patient transported to CT 

## 2019-04-18 NOTE — ED Notes (Signed)
PT given urinal and encouraged to try to give urine sample

## 2019-04-18 NOTE — ED Triage Notes (Addendum)
Pt comes via POV from home with c/o bilateral arm pain and possible rib injury. Pt states he was out working in his garage and a ladder fell and hit his chest and arms. Pt states he believes he fell into the ladder. Pt also c/o left knee pain.  Pt denies any LOC or head injury. Pt is alert and oriented.

## 2019-04-18 NOTE — ED Notes (Signed)
Pt unable to urinate at this time.  

## 2019-04-19 ENCOUNTER — Telehealth: Payer: Self-pay | Admitting: Nurse Practitioner

## 2019-04-19 ENCOUNTER — Telehealth: Payer: Self-pay

## 2019-04-19 DIAGNOSIS — R059 Cough, unspecified: Secondary | ICD-10-CM

## 2019-04-19 DIAGNOSIS — R0609 Other forms of dyspnea: Secondary | ICD-10-CM

## 2019-04-19 DIAGNOSIS — R05 Cough: Secondary | ICD-10-CM

## 2019-04-19 MED ORDER — BENZONATATE 200 MG PO CAPS: 200.0000 mg | ORAL_CAPSULE | Freq: Two times a day (BID) | ORAL | 0 refills | Status: DC | PRN

## 2019-04-19 MED ORDER — ALBUTEROL SULFATE HFA 108 (90 BASE) MCG/ACT IN AERS: 2.0000 | INHALATION_SPRAY | Freq: Four times a day (QID) | RESPIRATORY_TRACT | 1 refills | Status: DC | PRN

## 2019-04-19 NOTE — Telephone Encounter (Signed)
Reviewed chart. Recent ED visit. Previously treated for cough.  Sent rx cough medicine Tessalon perls to his [employer].  He may also take Mucinex for 7-10 days to help with mucus.  Nobie Putnam, [facility] 04/19/2019, 12:10 PM

## 2019-04-19 NOTE — Telephone Encounter (Signed)
I called the patient, no answer. Left detail message on the patients  personal vm.

## 2019-04-19 NOTE — Telephone Encounter (Signed)
Pt called [facility]  [address]  requesting refill on albuterol

## 2019-04-19 NOTE — Telephone Encounter (Signed)
Patient is requesting cough medicine, he has mucus from past  2 weeks he has rib injury and coughing making it worst.

## 2019-04-25 ENCOUNTER — Other Ambulatory Visit: Payer: Self-pay | Admitting: Nurse Practitioner

## 2019-04-25 ENCOUNTER — Encounter: Payer: Self-pay | Admitting: Family Medicine

## 2019-04-25 ENCOUNTER — Ambulatory Visit (INDEPENDENT_AMBULATORY_CARE_PROVIDER_SITE_OTHER): Payer: Self-pay | Admitting: Family Medicine

## 2019-04-25 ENCOUNTER — Other Ambulatory Visit: Payer: Self-pay

## 2019-04-25 DIAGNOSIS — J209 Acute bronchitis, unspecified: Secondary | ICD-10-CM

## 2019-04-25 DIAGNOSIS — G5631 Lesion of radial nerve, right upper limb: Secondary | ICD-10-CM

## 2019-04-25 DIAGNOSIS — J440 Chronic obstructive pulmonary disease with acute lower respiratory infection: Secondary | ICD-10-CM

## 2019-04-25 MED ORDER — LEVOFLOXACIN 500 MG PO TABS: 500.0000 mg | ORAL_TABLET | Freq: Every day | ORAL | 0 refills | Status: DC

## 2019-04-25 MED ORDER — PROMETHAZINE-DM 6.25-15 MG/5ML PO SYRP: 5.0000 mL | ORAL_SOLUTION | Freq: Three times a day (TID) | ORAL | 0 refills | Status: DC | PRN

## 2019-04-25 MED ORDER — PREDNISONE 50 MG PO TABS: 50.0000 mg | ORAL_TABLET | Freq: Every day | ORAL | 0 refills | Status: DC

## 2019-04-25 NOTE — Progress Notes (Signed)
Virtual Visit via Telephone The purpose of this virtual visit is to provide medical care while limiting exposure to the novel coronavirus (COVID19) for both patient and office staff.  Consent was obtained for phone visit:  Yes.   Answered questions that patient had about telehealth interaction:  Yes.   I discussed the limitations, risks, security and privacy concerns of performing an evaluation and management service by telephone. I also discussed with the patient that there may be a patient responsible charge related to this service. The patient expressed understanding and agreed to proceed.  Patient Location: Home Provider Location: [facility])  ---------------------------------------------------------------------- Chief Complaint  Patient presents with  . Sore Throat    onset 2 month, mucus, cough worst at night     S: Reviewed CMA documentation. I have called patient and gathered additional HPI as follows:  Suspected Acute COPD Bronchitis / Smoker / Hoarse Productive Cough Long course over past 2-3 months with multiple visits ED, Urgent Care, Virtual Visit, he has been treated for constellation of symptoms including cough, dyspnea in past. He has a remote diagnosis of COPD by his report but he does not really endorse treating it with inhalers, despite listed on his med list - Today requesting further treatment options for sore throat and productive cough - Tried OTC Flonase, Mucinex, Florastor probiotic, Tessalon perls - he is concerned that after covid19 test he has had hoarse voice from the swab and thinks it could have affected him, or also he thinks drainage keeps causing this problem   Denies any high risk travel to areas of current concern for COVID19. Denies any known or suspected exposure to person with or possibly with COVID19.  Denies any fevers, chills, sweats, body ache, shortness of breath, sinus pain or pressure, headache, abdominal pain,  diarrhea  Past Medical History:  Diagnosis Date  . Allergy   . Arthritis    osteoarthritis  . Cancer ([place])    melanoma skin cancer face  . Chronic back pain    goes to Dr. Holley Raring at the pain clinic  . Current every day smoker   . Diverticulosis   . GERD (gastroesophageal reflux disease)   . H/O diverticulitis of colon   . Restless leg syndrome   . Sciatic pain    Social History   Tobacco Use  . Smoking status: Current Every Day Smoker    Packs/day: 2.00    Years: 30.00    Pack years: 60.00    Types: Cigarettes  . Smokeless tobacco: Never Used  Substance Use Topics  . Alcohol use: No    Frequency: Never  . Drug use: No    Current Outpatient Medications:  .  albuterol (VENTOLIN HFA) 108 (90 Base) MCG/ACT inhaler, Inhale 2 puffs into the lungs every 6 (six) hours as needed for wheezing or shortness of breath., Disp: 1 g, Rfl: 1 .  beclomethasone (QVAR) 40 MCG/ACT inhaler, Inhale into the lungs., Disp: , Rfl:  .  benzonatate (TESSALON) 200 MG capsule, Take 1 capsule (200 mg total) by mouth 2 (two) times daily as needed for cough., Disp: 30 capsule, Rfl: 0 .  Diclofenac Sodium 3 % GEL, Place 1 application onto the skin 3 (three) times daily as needed (left knee pain)., Disp: 2 Tube, Rfl: 2 .  lisinopril (ZESTRIL) 10 MG tablet, Take 1 tablet by mouth once daily, Disp: 30 tablet, Rfl: 2 .  tiZANidine (ZANAFLEX) 4 MG tablet, Take 1 tablet (4 mg total) by mouth every  8 (eight) hours as needed for muscle spasms., Disp: 60 tablet, Rfl: 1 .  escitalopram (LEXAPRO) 10 MG tablet, Take 1 tablet (10 mg total) by mouth daily. (Patient not taking: Reported on 04/25/2019), Disp: 30 tablet, Rfl: 2 .  hyoscyamine (LEVSIN) 0.125 MG tablet, Take 2 tablets (0.25 mg total) by mouth every 8 (eight) hours as needed for cramping. (Patient not taking: Reported on 04/25/2019), Disp: 30 tablet, Rfl: 0 .  levofloxacin (LEVAQUIN) 500 MG tablet, Take 1 tablet (500 mg total) by mouth daily. For 7 days, Disp: 7  tablet, Rfl: 0 .  Oxycodone HCl 10 MG TABS, Take 1 tablet (10 mg total) by mouth every 8 (eight) hours as needed for up to 30 days. Must last 30 days. (Patient not taking: Reported on 04/25/2019), Disp: 75 tablet, Rfl: 0 .  [START ON 05/05/2019] Oxycodone HCl 10 MG TABS, Take 1 tablet (10 mg total) by mouth every 8 (eight) hours as needed for up to 30 days. Must last 30 days. (Patient not taking: Reported on 04/25/2019), Disp: 75 tablet, Rfl: 0 .  pantoprazole (PROTONIX) 40 MG tablet, Take 1 tablet (40 mg total) by mouth daily for 14 days. (Patient not taking: Reported on 04/25/2019), Disp: 14 tablet, Rfl: 0 .  predniSONE (DELTASONE) 50 MG tablet, Take 1 tablet (50 mg total) by mouth daily with breakfast., Disp: 5 tablet, Rfl: 0 .  promethazine-dextromethorphan (PROMETHAZINE-DM) 6.25-15 MG/5ML syrup, Take 5 mLs by mouth 3 (three) times daily as needed for cough., Disp: 118 mL, Rfl: 0  Depression screen [facility] 2/9 12/28/2018 12/06/2018 11/08/2018  Decreased Interest 0 0 0  Down, Depressed, Hopeless 0 0 0  PHQ - 2 Score 0 0 0  Altered sleeping - - -  Tired, decreased energy - - -  Change in appetite - - -  Feeling bad or failure about yourself  - - -  Trouble concentrating - - -  Moving slowly or fidgety/restless - - -  Suicidal thoughts - - -  PHQ-9 Score - - -  Difficult doing work/chores - - -    No flowsheet data found.  -------------------------------------------------------------------------- O: No physical exam performed due to remote telephone encounter.  Lab results reviewed.  Recent Results (from the past 2160 hour(s))  Comprehensive metabolic panel     Status: Abnormal   Collection Time: 02/09/19  5:32 PM  Result Value Ref Range   Sodium 138 135 - 145 mmol/L   Potassium 4.3 3.5 - 5.1 mmol/L   Chloride 105 98 - 111 mmol/L   CO2 25 22 - 32 mmol/L   Glucose, Bld 136 (H) 70 - 99 mg/dL   BUN 19 6 - 20 mg/dL   Creatinine, Ser 1.12 0.61 - 1.24 mg/dL   Calcium 9.6 8.9 - 10.3 mg/dL    Total Protein 8.2 (H) 6.5 - 8.1 g/dL   Albumin 4.8 3.5 - 5.0 g/dL   AST 23 15 - 41 U/L   ALT 22 0 - 44 U/L   Alkaline Phosphatase 76 38 - 126 U/L   Total Bilirubin 1.0 0.3 - 1.2 mg/dL   GFR calc non Af Amer >60 >60 mL/min   GFR calc Af Amer >60 >60 mL/min   Anion gap 8 5 - 15    Comment: Performed at [facility], [address], [place], [place] [address]  CBC with Differential     Status: Abnormal   Collection Time: 02/09/19  5:32 PM  Result Value Ref Range   WBC 13.3 (H)  4.0 - 10.5 K/uL   RBC 5.17 4.22 - 5.81 MIL/uL   Hemoglobin 15.4 13.0 - 17.0 g/dL   HCT 47.0 39.0 - 52.0 %   MCV 90.9 80.0 - 100.0 fL   MCH 29.8 26.0 - 34.0 pg   MCHC 32.8 30.0 - 36.0 g/dL   RDW 12.9 11.5 - 15.5 %   Platelets 212 150 - 400 K/uL   nRBC 0.0 0.0 - 0.2 %   Neutrophils Relative % 80 %   Neutro Abs 10.5 (H) 1.7 - 7.7 K/uL   Lymphocytes Relative 13 %   Lymphs Abs 1.8 0.7 - 4.0 K/uL   Monocytes Relative 6 %   Monocytes Absolute 0.8 0.1 - 1.0 K/uL   Eosinophils Relative 0 %   Eosinophils Absolute 0.1 0.0 - 0.5 K/uL   Basophils Relative 1 %   Basophils Absolute 0.1 0.0 - 0.1 K/uL   Immature Granulocytes 0 %   Abs Immature Granulocytes 0.05 0.00 - 0.07 K/uL    Comment: Performed at [facility], [address] [place], [place] [address]  Lipase, blood     Status: None   Collection Time: 02/09/19  5:32 PM  Result Value Ref Range   Lipase 24 11 - 51 U/L    Comment: Performed at [facility], [address] [place], [place] [address]  Troponin I -     Status: None   Collection Time: 02/09/19  5:32 PM  Result Value Ref Range   Troponin I <0.03 <0.03 ng/mL    Comment: Performed at [facility], [address] [place], [place] [address]  Novel Coronavirus, NAA (hospital order; send-out to ref lab)     Status: None   Collection Time: 02/09/19  6:03 PM   Specimen: Oropharyngeal swab; Respiratory  Result Value Ref Range   SARS-CoV-2, NAA NOT DETECTED NOT  DETECTED    Comment: (NOTE) This test was developed and its performance characteristics determined by [employer]. This test has not been FDA cleared or approved. This test has been authorized by FDA under an Emergency Use Authorization (EUA). This test has been validated in accordance with the FDA's Guidance Document (Policy for [place] in Laboratories Certified to Perform High Complexity Testing under CLIA prior to Emergency Use Authorization for Coronavirus [id number] during the [facility] Emergency) issued on February 29th, 2020. FDA independent review of this validation is pending. This test is only authorized for the duration of time the declaration that circumstances exist justifying the authorization of the emergency use of in vitro diagnostic tests for detection of SARS-CoV- 2 virus and/or diagnosis of COVID-19 infection under section 564(b)(1) of the Act, 21 U.S.C. 591MBW-4(Y)(6), unless the authorization is terminated or revoked sooner. Performed At: [facility] [place], [place] [id number] Rush Farmer MD [id number]    Coronavirus Source NASOPHARYNGEAL     Comment: Performed at [facility], [address] [place], [place] [address]  Urinalysis, Complete w Microscopic     Status: Abnormal   Collection Time: 02/28/19  5:07 PM  Result Value Ref Range   Color, Urine STRAW (A) YELLOW   APPearance CLEAR (A) CLEAR   Specific Gravity, Urine 1.009 1.005 - 1.030   pH 6.0 5.0 - 8.0   Glucose, UA NEGATIVE NEGATIVE mg/dL   Hgb urine dipstick NEGATIVE NEGATIVE   Bilirubin Urine NEGATIVE NEGATIVE   Ketones, ur NEGATIVE NEGATIVE mg/dL   Protein, ur NEGATIVE NEGATIVE mg/dL   Nitrite NEGATIVE NEGATIVE  Leukocytes,Ua NEGATIVE NEGATIVE   RBC / HPF 0-5 0 - 5 RBC/hpf   WBC, UA 0-5 0 - 5 WBC/hpf   Bacteria, UA NONE SEEN NONE SEEN   Squamous Epithelial / LPF NONE SEEN 0 - 5    Comment: Performed at [facility], [address] [place], [place] [address]  CBC with Differential     Status: Abnormal   Collection Time: 02/28/19  5:19 PM  Result Value Ref Range   WBC 8.2 4.0 - 10.5 K/uL   RBC 4.57 4.22 - 5.81 MIL/uL   Hemoglobin 13.8 13.0 - 17.0 g/dL   HCT 41.8 39.0 - 52.0 %   MCV 91.5 80.0 - 100.0 fL   MCH 30.2 26.0 - 34.0 pg   MCHC 33.0 30.0 - 36.0 g/dL   RDW 13.2 11.5 - 15.5 %   Platelets 157 150 - 400 K/uL   nRBC 0.0 0.0 - 0.2 %   Neutrophils Relative % 63 %   Neutro Abs 5.2 1.7 - 7.7 K/uL   Lymphocytes Relative 20 %   Lymphs Abs 1.7 0.7 - 4.0 K/uL   Monocytes Relative 8 %   Monocytes Absolute 0.6 0.1 - 1.0 K/uL   Eosinophils Relative 7 %   Eosinophils Absolute 0.6 (H) 0.0 - 0.5 K/uL   Basophils Relative 1 %   Basophils Absolute 0.1 0.0 - 0.1 K/uL   Immature Granulocytes 1 %   Abs Immature Granulocytes 0.08 (H) 0.00 - 0.07 K/uL    Comment: Performed at [facility], [address] [place], [place] [address]  Comprehensive metabolic panel     Status: Abnormal   Collection Time: 02/28/19  5:19 PM  Result Value Ref Range   Sodium 144 135 - 145 mmol/L   Potassium 3.9 3.5 - 5.1 mmol/L   Chloride 111 98 - 111 mmol/L   CO2 23 22 - 32 mmol/L   Glucose, Bld 105 (H) 70 - 99 mg/dL   BUN 10 6 - 20 mg/dL   Creatinine, Ser 0.72 0.61 - 1.24 mg/dL   Calcium 9.4 8.9 - 10.3 mg/dL   Total Protein 7.2 6.5 - 8.1 g/dL   Albumin 4.5 3.5 - 5.0 g/dL   AST 22 15 - 41 U/L   ALT 31 0 - 44 U/L   Alkaline Phosphatase 76 38 - 126 U/L   Total Bilirubin 0.3 0.3 - 1.2 mg/dL   GFR calc non Af Amer >60 >60 mL/min   GFR calc Af Amer >60 >60 mL/min   Anion gap 10 5 - 15    Comment: Performed at [facility], [address] [place], [place] [address]  Fibrin derivatives [facility] only)     Status: None   Collection Time: 02/28/19  5:19 PM  Result Value Ref Range   Fibrin derivatives D-dimer ([facility]) 287.86 0.00 - 499.00 ng/mL (FEU)    Comment: (NOTE) <> Exclusion of Venous  Thromboembolism (VTE) - OUTPATIENT ONLY   (Emergency Department or [place])   0-499 ng/ml (FEU): With a low to intermediate pretest probability                      for VTE this test result excludes the diagnosis                      of VTE.   >499 ng/ml (FEU) : VTE not excluded; additional work up for VTE is  required. <> Testing on Inpatients and Evaluation of Disseminated Intravascular   Coagulation (DIC) Reference Range:   0-499 ng/ml (FEU) Performed at [facility], [address] [place], [place] [address]   Troponin I - ONCE - STAT     Status: None   Collection Time: 02/28/19  5:19 PM  Result Value Ref Range   Troponin I <0.03 <0.03 ng/mL    Comment: Performed at [facility], [address] [place], [place] [address]  Lipase, blood     Status: None   Collection Time: 03/20/19  7:17 AM  Result Value Ref Range   Lipase 50 11 - 51 U/L    Comment: Performed at [facility], [address] [place], [place] [address]  Comprehensive metabolic panel     Status: Abnormal   Collection Time: 03/20/19  7:17 AM  Result Value Ref Range   Sodium 140 135 - 145 mmol/L   Potassium 3.4 (L) 3.5 - 5.1 mmol/L   Chloride 110 98 - 111 mmol/L   CO2 22 22 - 32 mmol/L   Glucose, Bld 138 (H) 70 - 99 mg/dL   BUN 17 6 - 20 mg/dL   Creatinine, Ser 0.66 0.61 - 1.24 mg/dL   Calcium 9.2 8.9 - 10.3 mg/dL   Total Protein 7.8 6.5 - 8.1 g/dL   Albumin 4.5 3.5 - 5.0 g/dL   AST 22 15 - 41 U/L   ALT 26 0 - 44 U/L   Alkaline Phosphatase 63 38 - 126 U/L   Total Bilirubin 1.1 0.3 - 1.2 mg/dL   GFR calc non Af Amer >60 >60 mL/min   GFR calc Af Amer >60 >60 mL/min   Anion gap 8 5 - 15    Comment: Performed at [facility], [address] [place], [place] [address]  CBC     Status: None   Collection Time: 03/20/19  7:17 AM  Result Value Ref Range   WBC 8.8 4.0 - 10.5 K/uL   RBC 4.47 4.22 - 5.81 MIL/uL   Hemoglobin 13.5 13.0 - 17.0 g/dL   HCT  40.5 39.0 - 52.0 %   MCV 90.6 80.0 - 100.0 fL   MCH 30.2 26.0 - 34.0 pg   MCHC 33.3 30.0 - 36.0 g/dL   RDW 12.6 11.5 - 15.5 %   Platelets 219 150 - 400 K/uL   nRBC 0.0 0.0 - 0.2 %    Comment: Performed at [facility], [address] [place], [place] [address]  Troponin I - ONCE - STAT     Status: None   Collection Time: 03/20/19  7:17 AM  Result Value Ref Range   Troponin I <0.03 <0.03 ng/mL    Comment: Performed at [facility], [address] [place],  [address]  Urinalysis, Complete w Microscopic     Status: Abnormal   Collection Time: 03/20/19  8:47 AM  Result Value Ref Range   Color, Urine YELLOW (A) YELLOW   APPearance HAZY (A) CLEAR   Specific Gravity, Urine 1.031 (H) 1.005 - 1.030   pH 5.0 5.0 - 8.0   Glucose, UA NEGATIVE NEGATIVE mg/dL   Hgb urine dipstick NEGATIVE NEGATIVE   Bilirubin Urine NEGATIVE NEGATIVE   Ketones, ur NEGATIVE NEGATIVE mg/dL   Protein, ur 30 (A) NEGATIVE mg/dL   Nitrite NEGATIVE NEGATIVE   Leukocytes,Ua NEGATIVE NEGATIVE   RBC / HPF 0-5 0 - 5 RBC/hpf   WBC, UA 0-5 0 - 5 WBC/hpf  Bacteria, UA NONE SEEN NONE SEEN   Squamous Epithelial / LPF 0-5 0 - 5   Mucus PRESENT     Comment: Performed at [facility], [address] [place], [place] [address]  CBC with Differential     Status: None   Collection Time: 03/22/19  6:52 AM  Result Value Ref Range   WBC 9.0 4.0 - 10.5 K/uL   RBC 4.44 4.22 - 5.81 MIL/uL   Hemoglobin 13.3 13.0 - 17.0 g/dL   HCT 39.9 39.0 - 52.0 %   MCV 89.9 80.0 - 100.0 fL   MCH 30.0 26.0 - 34.0 pg   MCHC 33.3 30.0 - 36.0 g/dL   RDW 12.6 11.5 - 15.5 %   Platelets 245 150 - 400 K/uL   nRBC 0.0 0.0 - 0.2 %   Neutrophils Relative % 70 %   Neutro Abs 6.2 1.7 - 7.7 K/uL   Lymphocytes Relative 22 %   Lymphs Abs 2.0 0.7 - 4.0 K/uL   Monocytes Relative 7 %   Monocytes Absolute 0.6 0.1 - 1.0 K/uL   Eosinophils Relative 1 %   Eosinophils Absolute 0.1 0.0 - 0.5 K/uL   Basophils Relative 0 %    Basophils Absolute 0.0 0.0 - 0.1 K/uL   Immature Granulocytes 0 %   Abs Immature Granulocytes 0.03 0.00 - 0.07 K/uL    Comment: Performed at [facility], [address] [place], [place] [address]  Comprehensive metabolic panel     Status: Abnormal   Collection Time: 03/22/19  6:52 AM  Result Value Ref Range   Sodium 139 135 - 145 mmol/L   Potassium 3.3 (L) 3.5 - 5.1 mmol/L   Chloride 108 98 - 111 mmol/L   CO2 23 22 - 32 mmol/L   Glucose, Bld 125 (H) 70 - 99 mg/dL   BUN 12 6 - 20 mg/dL   Creatinine, Ser 0.76 0.61 - 1.24 mg/dL   Calcium 9.0 8.9 - 10.3 mg/dL   Total Protein 7.2 6.5 - 8.1 g/dL   Albumin 4.3 3.5 - 5.0 g/dL   AST 22 15 - 41 U/L   ALT 30 0 - 44 U/L   Alkaline Phosphatase 59 38 - 126 U/L   Total Bilirubin 1.2 0.3 - 1.2 mg/dL   GFR calc non Af Amer >60 >60 mL/min   GFR calc Af Amer >60 >60 mL/min   Anion gap 8 5 - 15    Comment: Performed at [facility], [address] [place], [place] [address]  Lipase, blood     Status: None   Collection Time: 03/22/19  6:52 AM  Result Value Ref Range   Lipase 44 11 - 51 U/L    Comment: Performed at [facility], [address] [place], [place] [address]  Troponin I - ONCE - STAT     Status: None   Collection Time: 03/22/19  6:52 AM  Result Value Ref Range   Troponin I <0.03 <0.03 ng/mL    Comment: Performed at [facility], [address] [place], [place] [address]  Urinalysis, Complete w Microscopic     Status: Abnormal   Collection Time: 03/22/19  7:08 AM  Result Value Ref Range   Color, Urine YELLOW (A) YELLOW   APPearance CLEAR (A) CLEAR   Specific Gravity, Urine 1.033 (H) 1.005 - 1.030   pH 7.0 5.0 - 8.0   Glucose, UA NEGATIVE NEGATIVE mg/dL   Hgb urine dipstick NEGATIVE NEGATIVE   Bilirubin  Urine NEGATIVE NEGATIVE   Ketones, ur NEGATIVE NEGATIVE mg/dL   Protein, ur NEGATIVE NEGATIVE mg/dL   Nitrite NEGATIVE NEGATIVE   Leukocytes,Ua NEGATIVE NEGATIVE   RBC / HPF 0-5 0 -  5 RBC/hpf   WBC, UA 0-5 0 - 5 WBC/hpf   Bacteria, UA NONE SEEN NONE SEEN   Squamous Epithelial / LPF NONE SEEN 0 - 5    Comment: Performed at [facility], [address] [place], [place] [address]  Comprehensive metabolic panel     Status: Abnormal   Collection Time: 04/18/19  8:54 PM  Result Value Ref Range   Sodium 143 135 - 145 mmol/L   Potassium 3.7 3.5 - 5.1 mmol/L   Chloride 108 98 - 111 mmol/L   CO2 24 22 - 32 mmol/L   Glucose, Bld 102 (H) 70 - 99 mg/dL   BUN 13 6 - 20 mg/dL   Creatinine, Ser 0.86 0.61 - 1.24 mg/dL   Calcium 9.6 8.9 - 10.3 mg/dL   Total Protein 7.4 6.5 - 8.1 g/dL   Albumin 4.6 3.5 - 5.0 g/dL   AST 13 (L) 15 - 41 U/L   ALT 15 0 - 44 U/L   Alkaline Phosphatase 66 38 - 126 U/L   Total Bilirubin 0.6 0.3 - 1.2 mg/dL   GFR calc non Af Amer >60 >60 mL/min   GFR calc Af Amer >60 >60 mL/min   Anion gap 11 5 - 15    Comment: Performed at [facility], [address] [place], [place] [address]  Ethanol     Status: None   Collection Time: 04/18/19  8:54 PM  Result Value Ref Range   Alcohol, Ethyl (B) <10 <10 mg/dL    Comment: (NOTE) Lowest detectable limit for serum alcohol is 10 mg/dL. For medical purposes only. Performed at [facility], [address] [place], [place] [address]   CBC with Differential     Status: None   Collection Time: 04/18/19  8:54 PM  Result Value Ref Range   WBC 8.0 4.0 - 10.5 K/uL   RBC 4.35 4.22 - 5.81 MIL/uL   Hemoglobin 13.1 13.0 - 17.0 g/dL   HCT 41.0 39.0 - 52.0 %   MCV 94.3 80.0 - 100.0 fL   MCH 30.1 26.0 - 34.0 pg   MCHC 32.0 30.0 - 36.0 g/dL   RDW 12.1 11.5 - 15.5 %   Platelets 170 150 - 400 K/uL   nRBC 0.0 0.0 - 0.2 %   Neutrophils Relative % 64 %   Neutro Abs 5.1 1.7 - 7.7 K/uL   Lymphocytes Relative 23 %   Lymphs Abs 1.9 0.7 - 4.0 K/uL   Monocytes Relative 7 %   Monocytes Absolute 0.6 0.1 - 1.0 K/uL   Eosinophils Relative 5 %   Eosinophils Absolute 0.4 0.0 - 0.5 K/uL   Basophils  Relative 1 %   Basophils Absolute 0.1 0.0 - 0.1 K/uL   Immature Granulocytes 0 %   Abs Immature Granulocytes 0.01 0.00 - 0.07 K/uL    Comment: Performed at [facility], [address] [place], [place] [address]  Urinalysis, Complete w Microscopic     Status: Abnormal   Collection Time: 04/18/19 10:25 PM  Result Value Ref Range   Color, Urine YELLOW (A) YELLOW   APPearance CLEAR (A) CLEAR   Specific Gravity, Urine 1.025 1.005 - 1.030   pH 6.0 5.0 - 8.0   Glucose, UA NEGATIVE NEGATIVE mg/dL  Hgb urine dipstick NEGATIVE NEGATIVE   Bilirubin Urine NEGATIVE NEGATIVE   Ketones, ur NEGATIVE NEGATIVE mg/dL   Protein, ur NEGATIVE NEGATIVE mg/dL   Nitrite NEGATIVE NEGATIVE   Leukocytes,Ua NEGATIVE NEGATIVE   RBC / HPF 0-5 0 - 5 RBC/hpf   WBC, UA 0-5 0 - 5 WBC/hpf   Bacteria, UA NONE SEEN NONE SEEN   Squamous Epithelial / LPF 0-5 0 - 5   Mucus PRESENT     Comment: Performed at [facility], [address], [place], [place] [address]  Urine Drug Screen, Qualitative     Status: Abnormal   Collection Time: 04/18/19 10:25 PM  Result Value Ref Range   Tricyclic, Ur Screen NONE DETECTED NONE DETECTED   Amphetamines, Ur Screen NONE DETECTED NONE DETECTED   MDMA (Ecstasy)Ur Screen NONE DETECTED NONE DETECTED   Cocaine Metabolite,Ur [place] NONE DETECTED NONE DETECTED   Opiate, Ur Screen NONE DETECTED NONE DETECTED   Phencyclidine (PCP) Ur S NONE DETECTED NONE DETECTED   Cannabinoid 50 Ng, Ur [place] NONE DETECTED NONE DETECTED   Barbiturates, Ur Screen NONE DETECTED NONE DETECTED   Benzodiazepine, Ur Scrn POSITIVE (A) NONE DETECTED   Methadone Scn, Ur NONE DETECTED NONE DETECTED    Comment: (NOTE) Tricyclics + metabolites, urine    Cutoff 1000 ng/mL Amphetamines + metabolites, urine  Cutoff 1000 ng/mL MDMA (Ecstasy), urine              Cutoff 500 ng/mL Cocaine Metabolite, urine          Cutoff 300 ng/mL Opiate + metabolites, urine        Cutoff 300 ng/mL Phencyclidine (PCP),  urine         Cutoff 25 ng/mL Cannabinoid, urine                 Cutoff 50 ng/mL Barbiturates + metabolites, urine  Cutoff 200 ng/mL Benzodiazepine, urine              Cutoff 200 ng/mL Methadone, urine                   Cutoff 300 ng/mL The urine drug screen provides only a preliminary, unconfirmed analytical test result and should not be used for non-medical purposes. Clinical consideration and professional judgment should be applied to any positive drug screen result due to possible interfering substances. A more specific alternate chemical method must be used in order to obtain a confirmed analytical result. Gas chromatography / mass spectrometry (GC/MS) is the preferred confirmat ory method. Performed at [facility], [address] [place], [place] [address]     -------------------------------------------------------------------------- A&P:  Problem List Items Addressed This Visit    None    Visit Diagnoses    Acute bronchitis with COPD ([place])    -  Primary   Relevant Medications   levofloxacin (LEVAQUIN) 500 MG tablet   predniSONE (DELTASONE) 50 MG tablet   promethazine-dextromethorphan (PROMETHAZINE-DM) 6.25-15 MG/5ML syrup     Consistent with persistent productive cough chronic bronchitis likely now considered COPD exacerbation Previous extensive evaluation hospital ED and urgent care, has had chest x-rays and COVID19 test negative Active smoker, seems to be likely undiagnosed copd as likely cause of lingering symptoms over months  Plan: 1. Start Prednisone 50mg  x 5 day steroid burst 2. Start taking Levaquin antibiotic 500mg  daily x 7 days 3. Re order cough syrup, non codeine, that was helpful for him 4. Future may warrant closer follow-up discuss maintenance inhaler therapy - history of  Qvar, reportedly not taking. Uncertain if albuterol was helpful Return criteria reviewed  Additionally given his complaints of hoarse voice after swap from [place]  testing, advised that if he is not satisfied with results we can refer him to ENT for more direct evaluation of this to see if any cause of hoarseness.   Meds ordered this encounter  Medications  . levofloxacin (LEVAQUIN) 500 MG tablet    Sig: Take 1 tablet (500 mg total) by mouth daily. For 7 days    Dispense:  7 tablet    Refill:  0  . predniSONE (DELTASONE) 50 MG tablet    Sig: Take 1 tablet (50 mg total) by mouth daily with breakfast.    Dispense:  5 tablet    Refill:  0  . promethazine-dextromethorphan (PROMETHAZINE-DM) 6.25-15 MG/5ML syrup    Sig: Take 5 mLs by mouth 3 (three) times daily as needed for cough.    Dispense:  118 mL    Refill:  0    Follow-up: - Return in 2 weeks if not improved  Patient verbalizes understanding with the above medical recommendations including the limitation of remote medical advice.  Specific follow-up and call-back criteria were given for patient to follow-up or seek medical care more urgently if needed.   - Time spent in direct consultation with patient on phone: 15 minutes  Nobie Putnam, [facility] 04/25/2019, 2:49 PM

## 2019-04-25 NOTE — Patient Instructions (Addendum)
This could be COPD flare  Start levaquin antibiotic  - Start Prednisone 50mg  daily for next 5 days - this will open up lungs allow you to breath better and treat that wheezing or bronchospasm - may need inhaler in future  Continue nasal spray if need  - Use nasal saline (Simply Saline or Ocean Spray) to flush nasal congestion multiple times a day, may help cough - Drink plenty of fluids to improve congestion  If your symptoms seem to worsen instead of improve over next several days, including significant fever / chills, worsening shortness of breath, worsening wheezing, or nausea / vomiting and can't take medicines - return sooner or go to hospital Emergency Department for more immediate treatment.   Please schedule a Follow-up Appointment to: Return in about 2 weeks (around 05/09/2019), or if symptoms worsen or fail to improve.  If you have any other questions or concerns, please feel free to call the office or send a message through [place]. You may also schedule an earlier appointment if necessary.  Additionally, you may be receiving a survey about your experience at our office within a few days to 1 week by e-mail or mail. We value your feedback.  Nobie Putnam, DO [facility]

## 2019-05-08 ENCOUNTER — Telehealth: Payer: Self-pay

## 2019-05-08 NOTE — Telephone Encounter (Signed)
Pt called complaining of hypotension. His blood pressure 83/46, 78 pulse about 10-15 minutes ago. He rechecked it with a different bp machine 123/55, pulse 79. He stated that he is feeling fine, but admits that he felt dizzy after standing up suddenly for about 1 minute and it subsided.

## 2019-05-08 NOTE — Telephone Encounter (Signed)
Reviewed with patient.  Likely is related to dehydration.  Isolated incident, so advised patient to call again if symptoms persist.  Push fluids.

## 2019-05-10 ENCOUNTER — Telehealth: Payer: Self-pay

## 2019-05-10 NOTE — Telephone Encounter (Signed)
Patient may take mucinex to help with congestion.  Take 600 mg every 12 hours as needed.  He can keep his appointment tomorrow for [place] to evaluate his COPD and provide better treatment recommendations.

## 2019-05-10 NOTE — Telephone Encounter (Signed)
The pt called complaining of cough, congestion and SOB w/ history of COPD. He thinks the SOB is related to the temperature outside. He states the cough has worsen over the last week. Temperature 99.9 F on yesterday, but denies a fever today. He said that he's been tested twice for the COVID-19. I scheduled the patient for an appt on tomorrow, but he's requesting that you call him in something to help break up the congestion. He doesn't want more Tessalon Pearles, because he said it doesn't work.

## 2019-05-10 NOTE — Telephone Encounter (Signed)
I spoke with the patient and notified him of Lauren recommendation. I also notified his significant other.

## 2019-05-11 ENCOUNTER — Other Ambulatory Visit: Payer: Self-pay

## 2019-05-11 ENCOUNTER — Encounter: Payer: Self-pay | Admitting: Nurse Practitioner

## 2019-05-11 ENCOUNTER — Ambulatory Visit (INDEPENDENT_AMBULATORY_CARE_PROVIDER_SITE_OTHER): Payer: Self-pay | Admitting: Nurse Practitioner

## 2019-05-11 DIAGNOSIS — J440 Chronic obstructive pulmonary disease with acute lower respiratory infection: Secondary | ICD-10-CM

## 2019-05-11 DIAGNOSIS — R05 Cough: Secondary | ICD-10-CM

## 2019-05-11 DIAGNOSIS — J209 Acute bronchitis, unspecified: Secondary | ICD-10-CM

## 2019-05-11 DIAGNOSIS — R059 Cough, unspecified: Secondary | ICD-10-CM

## 2019-05-11 MED ORDER — ANORO ELLIPTA 62.5-25 MCG/INH IN AEPB: 1.0000 | INHALATION_SPRAY | Freq: Every day | RESPIRATORY_TRACT | 2 refills | Status: DC

## 2019-05-11 MED ORDER — PREDNISONE 50 MG PO TABS: 50.0000 mg | ORAL_TABLET | Freq: Every day | ORAL | 0 refills | Status: DC

## 2019-05-11 NOTE — Progress Notes (Signed)
Telemedicine Encounter: Disclosed to patient at start of encounter that we will provide appropriate telemedicine services.  Patient consents to be treated via phone prior to discussion. - Patient is at his home and is accessed via telephone. - Services are provided by Cassell Smiles from [facility].  Subjective:    Patient ID: Anthony Nguyen, male    DOB: 07/19/1956, 63 y.o.   MRN: [id number]  Anthony Nguyen is a 63 y.o. male presenting on 05/11/2019 for Cough (onset 2 month tested for covid x 3 negative, sweating, choke when he cough and SOB, chest tightness and congestion denies fever )  HPI Cough/COPD exacerbation Started about 2 months ago.  In hospital, got abx.  Dehydrated.  Now has had cough since then.  He was seen on 04/25/2019 by Dr Parks Ranger and was treated for COPD exacerbation/acute bronchitis.  He notes some relief with prednisone, Levaquin and promethazine-dm. - he has used albuterol inhaler only and has no relief. - Coughing fits occur with vomiting mucousy emesis. - Movement causes sweating. - Patient is having some inspiratory wheezing.  Social History   Tobacco Use  . Smoking status: Current Every Day Smoker    Packs/day: 2.00    Years: 30.00    Pack years: 60.00    Types: Cigarettes  . Smokeless tobacco: Current User  Substance Use Topics  . Alcohol use: No    Frequency: Never  . Drug use: No    Review of Systems Per HPI unless specifically indicated above     Objective:    There were no vitals taken for this visit.  Wt Readings from Last 3 Encounters:  03/22/19 225 lb (102.1 kg)  03/20/19 230 lb (104.3 kg)  02/28/19 225 lb (102.1 kg)    Physical Exam Patient remotely monitored.  Verbal communication appropriate.  Cognition normal.   Results for orders placed or performed during the hospital encounter of 04/18/19  Comprehensive metabolic panel  Result Value Ref Range   Sodium 143 135 - 145 mmol/L   Potassium 3.7 3.5 -  5.1 mmol/L   Chloride 108 98 - 111 mmol/L   CO2 24 22 - 32 mmol/L   Glucose, Bld 102 (H) 70 - 99 mg/dL   BUN 13 6 - 20 mg/dL   Creatinine, Ser 0.86 0.61 - 1.24 mg/dL   Calcium 9.6 8.9 - 10.3 mg/dL   Total Protein 7.4 6.5 - 8.1 g/dL   Albumin 4.6 3.5 - 5.0 g/dL   AST 13 (L) 15 - 41 U/L   ALT 15 0 - 44 U/L   Alkaline Phosphatase 66 38 - 126 U/L   Total Bilirubin 0.6 0.3 - 1.2 mg/dL   GFR calc non Af Amer >60 >60 mL/min   GFR calc Af Amer >60 >60 mL/min   Anion gap 11 5 - 15  Ethanol  Result Value Ref Range   Alcohol, Ethyl (B) <10 <10 mg/dL  CBC with Differential  Result Value Ref Range   WBC 8.0 4.0 - 10.5 K/uL   RBC 4.35 4.22 - 5.81 MIL/uL   Hemoglobin 13.1 13.0 - 17.0 g/dL   HCT 41.0 39.0 - 52.0 %   MCV 94.3 80.0 - 100.0 fL   MCH 30.1 26.0 - 34.0 pg   MCHC 32.0 30.0 - 36.0 g/dL   RDW 12.1 11.5 - 15.5 %   Platelets 170 150 - 400 K/uL   nRBC 0.0 0.0 - 0.2 %   Neutrophils Relative % 64 %  Neutro Abs 5.1 1.7 - 7.7 K/uL   Lymphocytes Relative 23 %   Lymphs Abs 1.9 0.7 - 4.0 K/uL   Monocytes Relative 7 %   Monocytes Absolute 0.6 0.1 - 1.0 K/uL   Eosinophils Relative 5 %   Eosinophils Absolute 0.4 0.0 - 0.5 K/uL   Basophils Relative 1 %   Basophils Absolute 0.1 0.0 - 0.1 K/uL   Immature Granulocytes 0 %   Abs Immature Granulocytes 0.01 0.00 - 0.07 K/uL  Urinalysis, Complete w Microscopic  Result Value Ref Range   Color, Urine YELLOW (A) YELLOW   APPearance CLEAR (A) CLEAR   Specific Gravity, Urine 1.025 1.005 - 1.030   pH 6.0 5.0 - 8.0   Glucose, UA NEGATIVE NEGATIVE mg/dL   Hgb urine dipstick NEGATIVE NEGATIVE   Bilirubin Urine NEGATIVE NEGATIVE   Ketones, ur NEGATIVE NEGATIVE mg/dL   Protein, ur NEGATIVE NEGATIVE mg/dL   Nitrite NEGATIVE NEGATIVE   Leukocytes,Ua NEGATIVE NEGATIVE   RBC / HPF 0-5 0 - 5 RBC/hpf   WBC, UA 0-5 0 - 5 WBC/hpf   Bacteria, UA NONE SEEN NONE SEEN   Squamous Epithelial / LPF 0-5 0 - 5   Mucus PRESENT   Urine Drug Screen, Qualitative   Result Value Ref Range   Tricyclic, Ur Screen NONE DETECTED NONE DETECTED   Amphetamines, Ur Screen NONE DETECTED NONE DETECTED   MDMA (Ecstasy)Ur Screen NONE DETECTED NONE DETECTED   Cocaine Metabolite,Ur [place] NONE DETECTED NONE DETECTED   Opiate, Ur Screen NONE DETECTED NONE DETECTED   Phencyclidine (PCP) Ur S NONE DETECTED NONE DETECTED   Cannabinoid 50 Ng, Ur [place] NONE DETECTED NONE DETECTED   Barbiturates, Ur Screen NONE DETECTED NONE DETECTED   Benzodiazepine, Ur Scrn POSITIVE (A) NONE DETECTED   Methadone Scn, Ur NONE DETECTED NONE DETECTED      Assessment & Plan:   Problem List Items Addressed This Visit    None    Visit Diagnoses    Cough    -  Primary   Relevant Medications   budesonide-formoterol (SYMBICORT) 80-4.5 MCG/ACT inhaler   Other Relevant Orders   DG Chest 2 View (Completed)   Acute bronchitis with COPD (HCC)       Relevant Medications   predniSONE (DELTASONE) 50 MG tablet   budesonide-formoterol (SYMBICORT) 80-4.5 MCG/ACT inhaler      Continued acute bronchitis with COPD Moderately severe as it limits activity.  Afebrile and unlikely covid-19 due to 3 negative tests to date for same problem.  Moderate improvement after last course of prednisone, abx.  Plan: 1. Chest xray to rule out pneumonia given new diaphoresis with activity. 2. START prednisone 50 mg once daily x 5 days 3. START ICS/LABA Symbicort 2 puffs bid. 4. Follow-up prn within 2 weeks if not improving or if worsens.  Consider ER visit if breathing worsens.  May benefit from pulmonology referral for COPD evaluation and management.   Meds ordered this encounter  Medications  . umeclidinium-vilanterol (ANORO ELLIPTA) 62.5-25 MCG/INH AEPB    Sig: Inhale 1 puff into the lungs daily.    Dispense:  30 each    Refill:  2    Order Specific Question:   Supervising Provider    Answer:   Olin Hauser [[id number]]  . predniSONE (DELTASONE) 50 MG tablet    Sig: Take 1 tablet (50 mg total) by mouth  daily with breakfast.    Dispense:  5 tablet    Refill:  0    Order  Specific Question:   Supervising Provider    Answer:   Olin Hauser [[id number]]   - Time spent in direct consultation with patient via telemedicine about above concerns: 10 minutes  Follow up plan: Return 2-4 weeks if symptoms worsen or fail to improve.  Cassell Smiles, DNP, AGPCNP-BC Adult Gerontology Primary Care Nurse Practitioner [facility] 05/11/2019, 2:48 PM

## 2019-05-12 ENCOUNTER — Ambulatory Visit
Admission: RE | Admit: 2019-05-12 | Discharge: 2019-05-12 | Disposition: A | Discharge status: Home / Self Care | Payer: PRIVATE HEALTH INSURANCE | Source: Ambulatory Visit | Attending: Nurse Practitioner | Admitting: Nurse Practitioner

## 2019-05-12 ENCOUNTER — Encounter: Payer: Self-pay | Admitting: Nurse Practitioner

## 2019-05-12 ENCOUNTER — Ambulatory Visit
Admission: RE | Admit: 2019-05-12 | Discharge: 2019-05-12 | Disposition: A | Discharge status: Home / Self Care | Payer: PRIVATE HEALTH INSURANCE | Attending: Nurse Practitioner | Admitting: Nurse Practitioner

## 2019-05-12 ENCOUNTER — Other Ambulatory Visit: Payer: Self-pay

## 2019-05-12 DIAGNOSIS — R05 Cough: Secondary | ICD-10-CM | POA: Diagnosis not present

## 2019-05-12 DIAGNOSIS — R059 Cough, unspecified: Secondary | ICD-10-CM

## 2019-05-12 MED ORDER — BUDESONIDE-FORMOTEROL FUMARATE 80-4.5 MCG/ACT IN AERO: 2.0000 | INHALATION_SPRAY | Freq: Two times a day (BID) | RESPIRATORY_TRACT | 3 refills | Status: DC

## 2019-05-17 ENCOUNTER — Other Ambulatory Visit: Payer: Self-pay | Admitting: Family Medicine

## 2019-05-17 DIAGNOSIS — J209 Acute bronchitis, unspecified: Secondary | ICD-10-CM

## 2019-05-18 ENCOUNTER — Telehealth: Payer: Self-pay | Admitting: Nurse Practitioner

## 2019-05-18 NOTE — Telephone Encounter (Signed)
Pt is currently taking b12 pills, wants to see if it would be better if gets a b12 shot? And if he is able to get the shot here? Please advise.

## 2019-05-18 NOTE — Telephone Encounter (Signed)
The pt was notified. No questions or concerns. 

## 2019-05-18 NOTE — Telephone Encounter (Signed)
It would be best to have labs to check B12 levels.  If he is still deficient, we can give shots.  It is not likely he will need these though.  He can have B12 check with his next appointment.  If he is trying to treat a specific symptom, we may need to discuss additional workup.

## 2019-05-29 ENCOUNTER — Encounter: Payer: PRIVATE HEALTH INSURANCE | Admitting: Student in an Organized Health Care Education/Training Program

## 2019-05-29 ENCOUNTER — Encounter: Payer: Self-pay | Admitting: Student in an Organized Health Care Education/Training Program

## 2019-05-30 ENCOUNTER — Other Ambulatory Visit: Payer: Self-pay

## 2019-05-30 ENCOUNTER — Encounter: Payer: Self-pay | Admitting: Student in an Organized Health Care Education/Training Program

## 2019-05-30 ENCOUNTER — Ambulatory Visit
Payer: No Typology Code available for payment source | Attending: Student in an Organized Health Care Education/Training Program | Admitting: Student in an Organized Health Care Education/Training Program

## 2019-05-30 DIAGNOSIS — M51369 Other intervertebral disc degeneration, lumbar region without mention of lumbar back pain or lower extremity pain: Secondary | ICD-10-CM

## 2019-05-30 DIAGNOSIS — M170 Bilateral primary osteoarthritis of knee: Secondary | ICD-10-CM | POA: Diagnosis not present

## 2019-05-30 DIAGNOSIS — Z96652 Presence of left artificial knee joint: Secondary | ICD-10-CM

## 2019-05-30 DIAGNOSIS — G894 Chronic pain syndrome: Secondary | ICD-10-CM

## 2019-05-30 DIAGNOSIS — M48061 Spinal stenosis, lumbar region without neurogenic claudication: Secondary | ICD-10-CM | POA: Diagnosis not present

## 2019-05-30 DIAGNOSIS — M5416 Radiculopathy, lumbar region: Secondary | ICD-10-CM

## 2019-05-30 DIAGNOSIS — M5136 Other intervertebral disc degeneration, lumbar region: Secondary | ICD-10-CM

## 2019-05-30 MED ORDER — OXYCODONE HCL 10 MG PO TABS: 10.0000 mg | ORAL_TABLET | Freq: Three times a day (TID) | ORAL | 0 refills | Status: AC | PRN

## 2019-05-30 MED ORDER — OXYCODONE HCL 10 MG PO TABS: 10.0000 mg | ORAL_TABLET | Freq: Three times a day (TID) | ORAL | 0 refills | Status: DC | PRN

## 2019-05-30 NOTE — Progress Notes (Signed)
Pain Management Virtual Encounter Note - Virtual Visit via [place] (real-time audio visits between healthcare provider and patient).   Patient's Phone No. & Preferred Pharmacy:  [phone] (home); [phone] (mobile); (Preferred) [phone] [email or web address]  [facility] - Lorina Rabon, [place] [address] Phone: [phone] Fax: [place], [place] [address] [place] [address] Phone: [phone] Fax: [place] [address] ([address]), [place]) [place] [address] Phone: [phone] Fax: [phone]    Pre-screening note:  Our staff contacted Mr. Augspurger and offered him an "in person", "face-to-face" appointment versus a telephone encounter. He indicated preferring the telephone encounter, at this time.   Reason for Virtual Visit: COVID-19*  Social distancing based on CDC and AMA recommendations.   I contacted Marquon Alcala Krasinski on 05/30/2019 via video conference.      I clearly identified myself as [name], MD. I verified that I was speaking with the correct person using two identifiers (Name: CARI VANDEBERG, and date of birth: [birth year]).  Advanced Informed Consent I sought verbal advanced consent from [place] for virtual visit interactions. I informed Mr. Bevilacqua of possible security and privacy concerns, risks, and limitations associated with providing "not-in-person" medical evaluation and management services. I also informed Mr. Grilliot of the availability of "in-person" appointments. Finally, I informed him that there would be a charge for the virtual visit and that he could be  personally, fully or partially, financially responsible for it. Mr. Crihfield expressed understanding and agreed to proceed.   Historic Elements   Mr. AUSTIN HERD is a 63 y.o. year old, male patient evaluated today after his last encounter by our practice on 04/03/2019. Mr. Wishon  has a past medical history of Allergy, Arthritis, Cancer ([place]), Chronic back pain, Current every day smoker, Diverticulosis, GERD (gastroesophageal reflux disease), H/O diverticulitis of colon, Restless leg syndrome, and Sciatic pain. 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 Mr. Shearn has a current medication list which includes the following prescription(s): albuterol, budesonide-formoterol, diclofenac sodium, escitalopram, hyoscyamine, lisinopril, oxycodone hcl, oxycodone hcl, oxycodone hcl, tizanidine, pantoprazole, prednisone, and promethazine-dextromethorphan. He  reports that he has been smoking cigarettes. He has a 60.00 pack-year smoking history. He uses smokeless tobacco. He reports that he does not drink alcohol or use drugs. Mr. Davidian has No Known Allergies.   HPI  Today, he is being contacted for medication management.   Continues to endorse pain in his right low back, right lateral thigh.  Patient did sustain a fall off of the ladder and states that he did sustain some bruising from this.  At last visit, plan was to repeat lumbar epidural steroid injection for lumbar radicular pain however given patient's insurance he states that from a financial standpoint I will have to hold off.  We will place as needed orders today when patient is able to transfer to new insurance he can have procedure done.  Pharmacotherapy Assessment   05/05/2019  1   03/28/2019  Oxycodone Hcl 10 MG Tablet  75.00 25 Bi Lat   [id number]   [place] ([id number])   0  45.00 MME  Comm Ins   [place]    Monitoring: Pharmacotherapy: No side-effects or adverse reactions reported.  PMP:  PDMP reviewed during this encounter.       Compliance: No problems  identified. Effectiveness: Clinically acceptable. Plan: Refer to "POC".  UDS:  Summary  Date Value Ref Range Status  12/28/2018 FINAL  Final    Comment:    ==================================================================== TOXASSURE SELECT 13 (MW) ==================================================================== Test                             Result       Flag       Units Drug Present and Declared for Prescription Verification   Oxycodone                      314          EXPECTED   ng/mg creat   Oxymorphone                    331          EXPECTED   ng/mg creat   Noroxycodone                   1838         EXPECTED   ng/mg creat   Noroxymorphone                 130          EXPECTED   ng/mg creat    Sources of oxycodone are scheduled prescription medications.    Oxymorphone, noroxycodone, and noroxymorphone are expected    metabolites of oxycodone. Oxymorphone is also available as a    scheduled prescription medication. ==================================================================== Test                      Result    Flag   Units      Ref Range   Creatinine              102              mg/dL      >=20 ==================================================================== Declared Medications:  The flagging and interpretation on this report are based on the  following declared medications.  Unexpected results may arise from  inaccuracies in the declared medications.  **Note: The testing scope of this panel includes these medications:  Oxycodone  **Note: The testing scope of this panel does not include following  reported medications:  Diclofenac  Lisinopril  Melatonin ==================================================================== For clinical consultation, please call [phone]. ====================================================================    Laboratory Chemistry Profile (12 mo)  Renal: 04/18/2019: BUN 13; Creatinine, Ser 0.86  Lab Results   Component Value Date   GFRAA >60 04/18/2019   GFRNONAA >60 04/18/2019   Hepatic: 04/18/2019: Albumin 4.6 Lab Results  Component Value Date   AST 13 (L) 04/18/2019   ALT 15 04/18/2019   Other: No results found for requested labs within last 8760 hours. Note: Above Lab results reviewed.  Imaging  Last 90 days:  Dg Chest 2 View  Result Date: 05/12/2019 CLINICAL DATA:  Productive cough x2 months as well as diaphoresis with activity. Patient states that he feels like he has phlegm in his throat and chest that he cannot cough up. Per patient request patient had 3 negative covid tests. Current smoker. EXAM: CHEST - 2 VIEW COMPARISON:  04/18/2019 chest x-ray and 11/07/2014 chest CT FINDINGS: Cardiomediastinal silhouette is normal. The lungs are free of focal consolidations and pleural effusions. There is mild prominence of  interstitial markings slightly increased compared to prior study. No evidence for pulmonary edema. Mild degenerative changes in the thoracic spine. IMPRESSION: Mildly prominent interstitial markings, consistent with viral infection or inflammatory process. No consolidations. Electronically Signed   By: Nolon Nations M.D.   On: 05/12/2019 15:16   Dg Ribs Unilateral W/chest Right  Result Date: 04/18/2019 CLINICAL DATA:  Right rib pain after fall from ladder. EXAM: RIGHT RIBS AND CHEST - 3+ VIEW COMPARISON:  02/28/2019 FINDINGS: No fracture or other bone lesions are seen involving the ribs. There is no evidence of pneumothorax or pleural effusion. Both lungs are clear. Heart size and mediastinal contours are within normal limits. IMPRESSION: Negative. Electronically Signed   By: Kerby Moors M.D.   On: 04/18/2019 21:02   Dg Elbow Complete Left  Result Date: 04/18/2019 CLINICAL DATA:  [place] off ladder EXAM: LEFT ELBOW - COMPLETE 3+ VIEW COMPARISON:  None. FINDINGS: There is no evidence of fracture, dislocation, or joint effusion. There is no evidence of arthropathy or other focal  bone abnormality. Soft tissues are unremarkable. IMPRESSION: Negative. Electronically Signed   By: Donavan Foil M.D.   On: 04/18/2019 21:01   Dg Elbow Complete Right  Result Date: 04/18/2019 CLINICAL DATA:  [place] from ladder EXAM: RIGHT ELBOW - COMPLETE 3+ VIEW COMPARISON:  None. FINDINGS: There is no evidence of fracture, dislocation, or joint effusion. There is no evidence of arthropathy or other focal bone abnormality. Soft tissues are unremarkable. IMPRESSION: Negative. Electronically Signed   By: Donavan Foil M.D.   On: 04/18/2019 21:04   Dg Tibia/fibula Left  Result Date: 04/18/2019 CLINICAL DATA:  [place] off ladder EXAM: LEFT TIBIA AND FIBULA - 2 VIEW COMPARISON:  CT 09/16/2017, radiograph 10/14/2017 FINDINGS: Prior left knee replacement with grossly intact hardware. No acute displaced fracture or malalignment. [place] versus small loose body at the medial ankle joint. IMPRESSION: Prior knee replacement.  No acute osseous abnormality. Electronically Signed   By: Donavan Foil M.D.   On: 04/18/2019 21:02   Ct Head Wo Contrast  Result Date: 04/18/2019 CLINICAL DATA:  [place] into ladder. Uncertain head injury or loss of consciousness. EXAM: CT HEAD WITHOUT CONTRAST CT CERVICAL SPINE WITHOUT CONTRAST TECHNIQUE: Multidetector CT imaging of the head and cervical spine was performed following the standard protocol without intravenous contrast. Multiplanar CT image reconstructions of the cervical spine were also generated. COMPARISON:  Head CT 02/28/2019. Cervical spine CT 11/07/2014. FINDINGS: CT HEAD FINDINGS Brain: There is no evidence of acute infarct, intracranial hemorrhage, mass, midline shift, or extra-axial fluid collection. The ventricles and sulci are normal. Vascular: Calcified atherosclerosis at the skull base. No hyperdense vessel. Skull: No fracture or suspicious osseous lesion. Sinuses/Orbits: Mild scattered mucosal thickening in the paranasal sinuses. Partially visualized mucous in the left  maxillary sinus. No significant mastoid fluid. Unremarkable orbits. Other: None. CT CERVICAL SPINE FINDINGS Alignment: Cervical spine straightening. Trace chronic anterolisthesis of C2 on C3. Skull base and vertebrae: No acute fracture or suspicious osseous lesion. Soft tissues and spinal canal: No prevertebral fluid or swelling. No visible canal hematoma. Disc levels: Similar appearance of focally advanced disc degeneration at C6-7 with moderate bilateral neural foraminal stenosis due to uncovertebral spurring as well as suspected mild spinal stenosis due to disc bulging. Upper chest: Clear lung apices. Other: Moderate calcified atherosclerosis at the carotid bifurcations. 1.5 cm subcutaneous nodule posteriorly in the neck slightly left of midline, minimally larger than in 2016 and likely a sebaceous cyst. IMPRESSION: 1. No evidence of acute intracranial  abnormality. Unremarkable CT appearance of the brain. 2. No evidence of acute cervical spine fracture or traumatic subluxation. Electronically Signed   By: Logan Bores M.D.   On: 04/18/2019 20:35   Ct Cervical Spine Wo Contrast  Result Date: 04/18/2019 CLINICAL DATA:  [place] into ladder. Uncertain head injury or loss of consciousness. EXAM: CT HEAD WITHOUT CONTRAST CT CERVICAL SPINE WITHOUT CONTRAST TECHNIQUE: Multidetector CT imaging of the head and cervical spine was performed following the standard protocol without intravenous contrast. Multiplanar CT image reconstructions of the cervical spine were also generated. COMPARISON:  Head CT 02/28/2019. Cervical spine CT 11/07/2014. FINDINGS: CT HEAD FINDINGS Brain: There is no evidence of acute infarct, intracranial hemorrhage, mass, midline shift, or extra-axial fluid collection. The ventricles and sulci are normal. Vascular: Calcified atherosclerosis at the skull base. No hyperdense vessel. Skull: No fracture or suspicious osseous lesion. Sinuses/Orbits: Mild scattered mucosal thickening in the paranasal  sinuses. Partially visualized mucous in the left maxillary sinus. No significant mastoid fluid. Unremarkable orbits. Other: None. CT CERVICAL SPINE FINDINGS Alignment: Cervical spine straightening. Trace chronic anterolisthesis of C2 on C3. Skull base and vertebrae: No acute fracture or suspicious osseous lesion. Soft tissues and spinal canal: No prevertebral fluid or swelling. No visible canal hematoma. Disc levels: Similar appearance of focally advanced disc degeneration at C6-7 with moderate bilateral neural foraminal stenosis due to uncovertebral spurring as well as suspected mild spinal stenosis due to disc bulging. Upper chest: Clear lung apices. Other: Moderate calcified atherosclerosis at the carotid bifurcations. 1.5 cm subcutaneous nodule posteriorly in the neck slightly left of midline, minimally larger than in 2016 and likely a sebaceous cyst. IMPRESSION: 1. No evidence of acute intracranial abnormality. Unremarkable CT appearance of the brain. 2. No evidence of acute cervical spine fracture or traumatic subluxation. Electronically Signed   By: Logan Bores M.D.   On: 04/18/2019 20:35   Ct Abdomen Pelvis W Contrast  Result Date: 03/22/2019 CLINICAL DATA:  63 year old with nausea and vomiting. Abdominal pain. Evaluate for diverticulitis or colitis. EXAM: CT ABDOMEN AND PELVIS WITH CONTRAST TECHNIQUE: Multidetector CT imaging of the abdomen and pelvis was performed using the standard protocol following bolus administration of intravenous contrast. CONTRAST:  122mL OMNIPAQUE IOHEXOL 300 MG/ML  SOLN COMPARISON:  CT 04/06/2018 FINDINGS: Lower chest: Stable 5 mm nodule in the left lower lobe on sequence 4, image 6. Subtle punctate density in the left lower lobe on image 1 is similar to the previous examination. Scarring or atelectasis along the posterior left lung base. No large pleural effusions. Left pulmonary nodules were present on the CT from 11/07/2014 and suggestive for benign findings.  Hepatobiliary: Stable focal fat near the falciform ligament. Normal appearance of the liver without a suspicious lesion. Portal venous system is patent. Normal appearance of the gallbladder. No biliary dilatation. Pancreas: Unremarkable. No pancreatic ductal dilatation or surrounding inflammatory changes. Spleen: Normal in size without focal abnormality. Adrenals/Urinary Tract: Normal adrenal glands. Small hypodensities in the kidneys that are too small to definitively characterize. No suspicious renal lesions. Evidence for small left posterior bladder diverticulum and similar to the prior examination. Negative for hydronephrosis. Stomach/Bowel: Stomach is within normal limits. Appendix appears normal. No evidence of bowel wall thickening, distention, or inflammatory changes. Vascular/Lymphatic: Atherosclerotic calcifications involving the abdominal aorta and iliac arteries without aneurysm. No abdominopelvic lymphadenopathy. Reproductive: Stable prostate calcifications. Other: Negative for ascites. Negative for free air. Umbilical hernia containing fat. Musculoskeletal: Stable sclerosis involving the right pubic bone. Old posterior fractures involving the left ninth  and tenth ribs. Old fracture involving the right ninth rib. Evidence for an old right anterior rib fracture. 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 IMPRESSION: 1. No acute abnormality in the abdomen or pelvis. 2. Small bladder diverticulum. 3. Small pulmonary nodules that have minimally changed since 2016 and compatible with benign findings. 4. Hypodensities in the kidneys that are too small to definitively characterize. 5.  Aortic Atherosclerosis (ICD10-I70.0). Electronically Signed   By: Markus Daft M.D.   On: 03/22/2019 09:14    Assessment  The primary encounter diagnosis was Lumbar foraminal stenosis. Diagnoses of Chronic pain syndrome, Primary osteoarthritis of both knees, Lumbar radiculopathy, History of left knee  replacement, and Lumbar degenerative disc disease were also pertinent to this visit.  Plan of Care  I have changed Kyro L. Reier's Oxycodone HCl. I am also having him start on Oxycodone HCl and Oxycodone HCl. Additionally, I am having him maintain his Diclofenac Sodium, lisinopril, escitalopram, hyoscyamine, pantoprazole, albuterol, promethazine-dextromethorphan, tiZANidine, predniSONE, and budesonide-formoterol.  Continue Voltaren gel and Tizanidine prn  Pharmacotherapy (Medications Ordered): Meds ordered this encounter  Medications  . Oxycodone HCl 10 MG TABS    Sig: Take 1 tablet (10 mg total) by mouth every 8 (eight) hours as needed. Must last 30 days.    Dispense:  75 tablet    Refill:  0    Chronic Pain. (STOP Act - Not applicable). Fill one day early if closed on scheduled refill date. Max 75/month.  . Oxycodone HCl 10 MG TABS    Sig: Take 1 tablet (10 mg total) by mouth every 8 (eight) hours as needed. Must last 30 days.    Dispense:  75 tablet    Refill:  0    Chronic Pain. (STOP Act - Not applicable). Fill one day early if closed on scheduled refill date. Max 75/month.  . Oxycodone HCl 10 MG TABS    Sig: Take 1 tablet (10 mg total) by mouth every 8 (eight) hours as needed. Must last 30 days.    Dispense:  75 tablet    Refill:  0    Chronic Pain. (STOP Act - Not applicable). Fill one day early if closed on scheduled refill date. Max 75/month.   Orders:  Orders Placed This Encounter  Procedures  . Lumbar Epidural Injection    Standing Status:   Standing    Number of Occurrences:   9    Standing Expiration Date:   11/29/2020    Scheduling Instructions:     Purpose: Palliative     Indication: Lower extremity pain/Sciatica unspecified side (M54.30).     Side: Midline     Level: TBD     Sedation: WITHOUT     TIMEFRAME: PRN procedure. (Mr. Fester will call when needed.)    Order Specific Question:   Where will this procedure be performed?    Answer:   [facility]   Follow-up plan:   Return in about 3 months (around 08/30/2019) for Medication Management.    Recent Visits Date Type Provider Dept  03/28/19 Office Visit [name], MD [facility]  Showing recent visits within past 90 days and meeting all other requirements   Today's Visits Date Type Provider Dept  05/30/19 Office Visit [name], MD [facility]  Showing today's visits and meeting all other requirements   Future Appointments No visits were found meeting these conditions.  Showing future appointments within next 90 days and meeting all other requirements  I discussed the assessment and treatment plan with the patient. The patient was provided an opportunity to ask questions and all were answered. The patient agreed with the plan and demonstrated an understanding of the instructions.  Patient advised to call back or seek an in-person evaluation if the symptoms or condition worsens.  Total duration of non-face-to-face encounter: 25 minutes.  Note by: [name], MD Date: 05/30/2019; Time: 11:47 AM  Note: This dictation was prepared with Dragon dictation. Any transcriptional errors that may result from this process are unintentional.  Disclaimer:  * Given the special circumstances of the COVID-19 pandemic, the federal government has announced that the Office for Civil Rights (OCR) will exercise its enforcement discretion and will not impose penalties on physicians using telehealth in the event of noncompliance with regulatory requirements under the [place] and [place] (HIPAA) in connection with the good faith provision of telehealth during the YNWGN-56 national public health emergency. ([place])

## 2019-06-02 ENCOUNTER — Other Ambulatory Visit: Payer: Self-pay | Admitting: Family Medicine

## 2019-06-02 ENCOUNTER — Other Ambulatory Visit: Payer: Self-pay | Admitting: Nurse Practitioner

## 2019-06-02 DIAGNOSIS — I10 Essential (primary) hypertension: Secondary | ICD-10-CM

## 2019-06-02 DIAGNOSIS — G5631 Lesion of radial nerve, right upper limb: Secondary | ICD-10-CM

## 2019-06-02 MED ORDER — LISINOPRIL 10 MG PO TABS: 10.0000 mg | ORAL_TABLET | Freq: Every day | ORAL | 2 refills | Status: DC

## 2019-06-02 MED ORDER — TIZANIDINE HCL 4 MG PO TABS: ORAL_TABLET | ORAL | 1 refills | Status: DC

## 2019-06-02 NOTE — Telephone Encounter (Signed)
Pt  Called requesting refill on Lisinopril, tizanidine  mg called into [facility] [place] . Pt call back # is [phone]

## 2019-06-15 IMAGING — DX DG KNEE 1-2V*L*
2 series · 2 of 2 positions shown · non-contrast
Comparison: 09/16/2017 CT.

CLINICAL DATA: 61-year-old male post knee replacement. Initial
encounter.

EXAM:
LEFT KNEE - 1-2 VIEW

[knee ap]
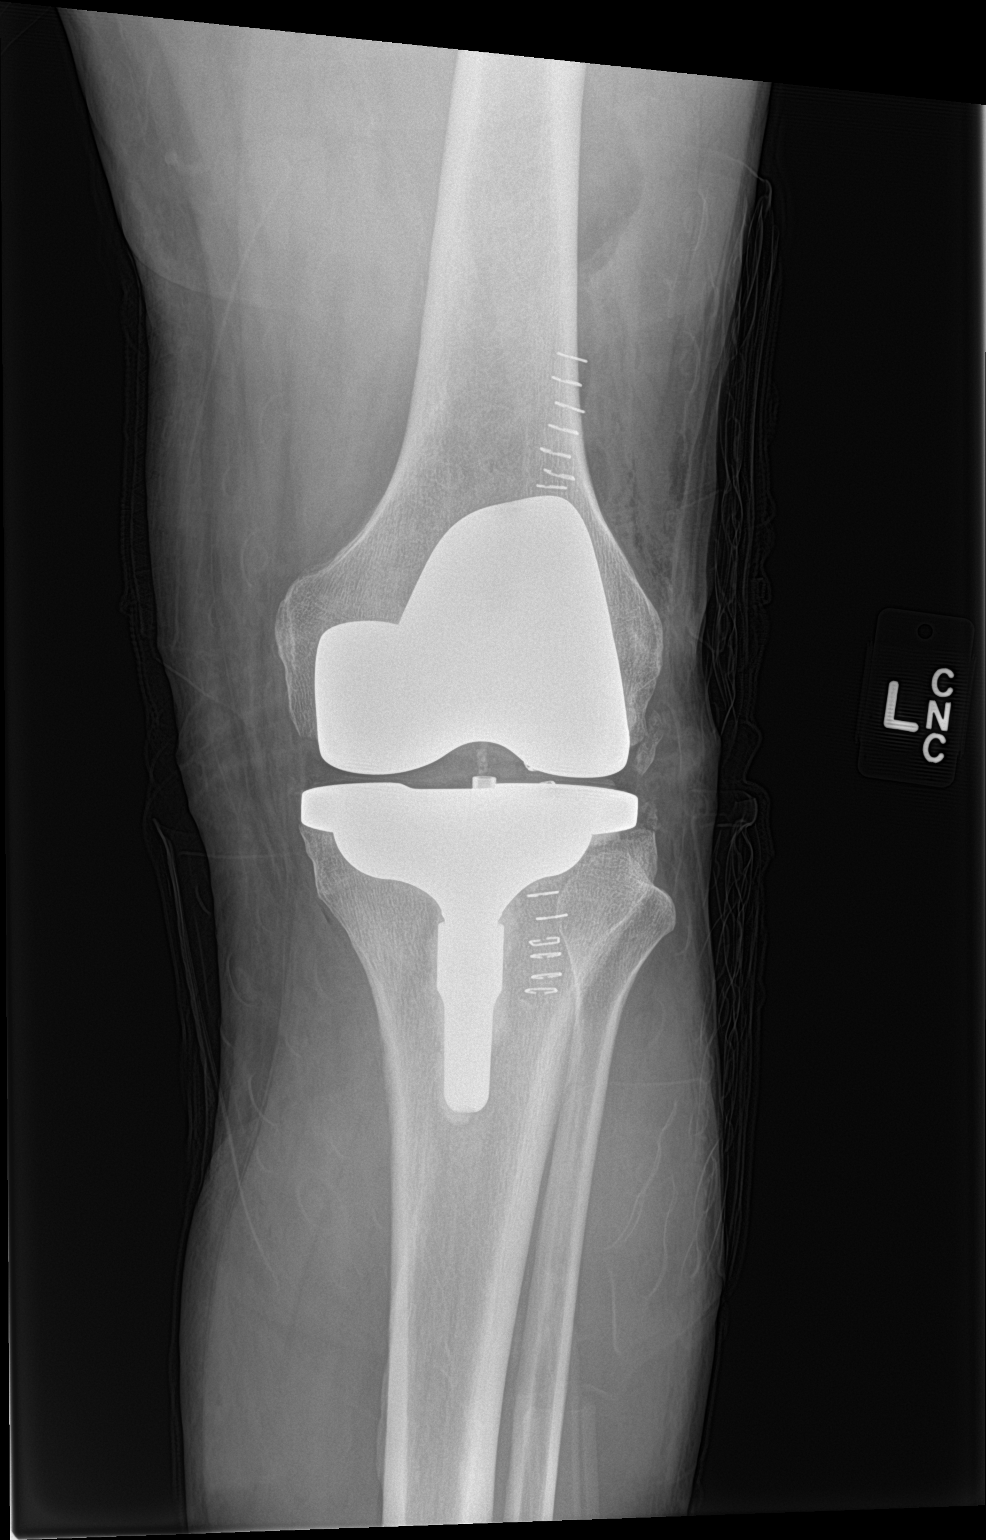

[knee lat]
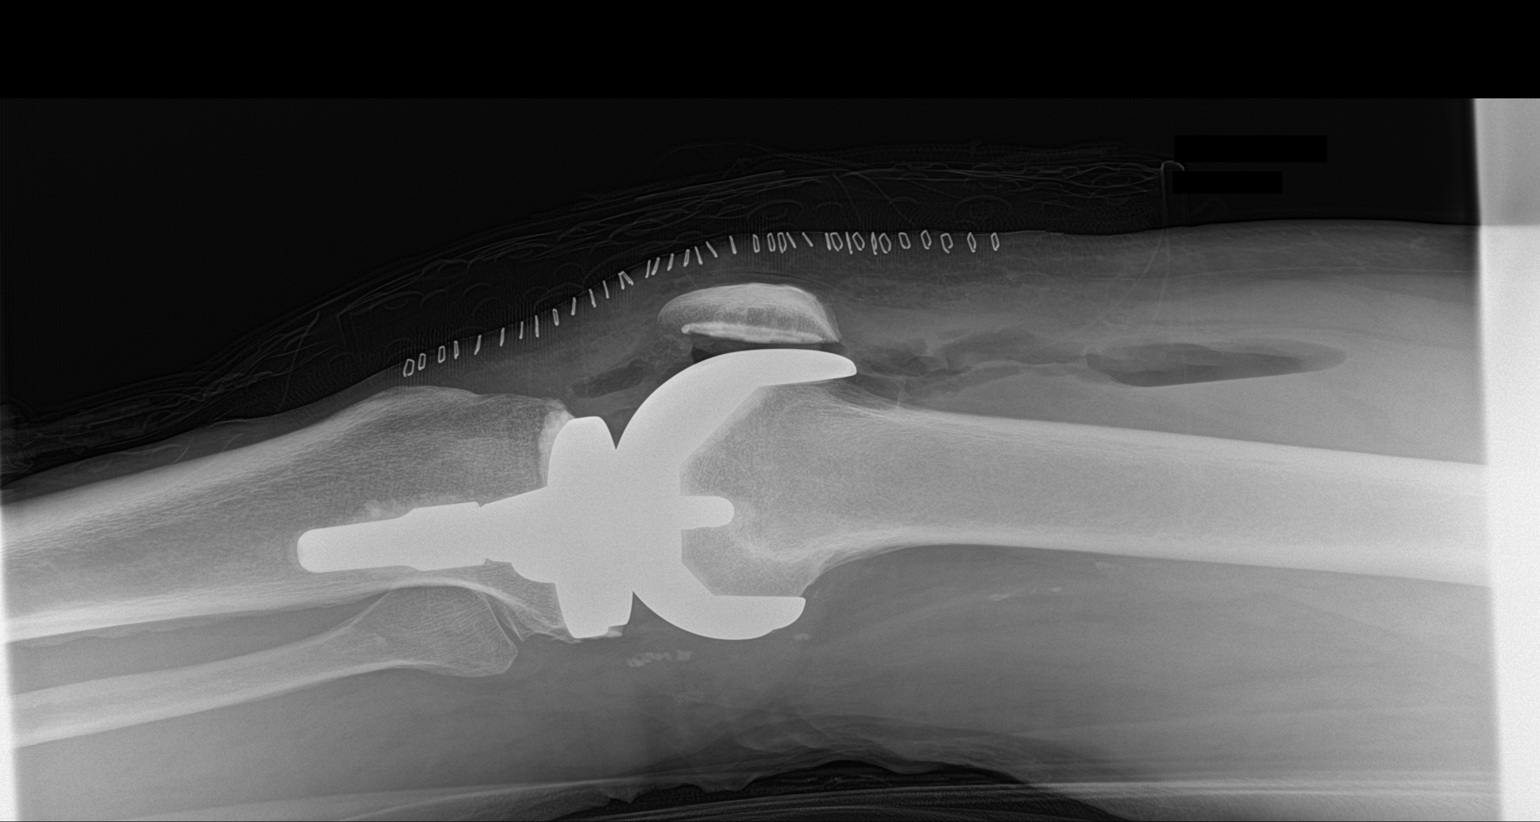

[2 of 2 positions shown; findings below may reference images not displayed]

FINDINGS: Post total left knee replacement.

Minimal lucency along the lateral aspect of the tibial component
(see arrow) may represent expected finding.

No surrounding fracture.
IMPRESSION: Post total left knee replacement.

Minimal lucency along the lateral aspect of the tibial component
(see arrow) may represent expected finding.

## 2019-06-20 ENCOUNTER — Other Ambulatory Visit: Payer: Self-pay | Admitting: Nurse Practitioner

## 2019-06-20 DIAGNOSIS — G5631 Lesion of radial nerve, right upper limb: Secondary | ICD-10-CM

## 2019-07-25 ENCOUNTER — Telehealth: Payer: Self-pay

## 2019-07-25 NOTE — Telephone Encounter (Signed)
I recommend appointment.  Please call 10/7 to schedule something.  This is not urgent for today.

## 2019-07-25 NOTE — Telephone Encounter (Signed)
The pt woke up this morning with lightheadedness that happen when he stand suddenly bp 110/63, pulse 86.  He also complains that he had  diarrhea x 4 days that started Friday and subsided on Sunday. He also complains of possible sinus issues.He denies any cardiac symptoms or dehydration. I offered him an appt for Thursday. He just want to make sure the dizziness and blood pressure is not concerning. I reassured him that the blood pressure was not to low and that I will send you a message and follow back up with him.   He's been tested for COVID x 5 times. The last test was 1 week ago.

## 2019-07-27 ENCOUNTER — Encounter: Payer: Self-pay | Admitting: Nurse Practitioner

## 2019-07-27 ENCOUNTER — Other Ambulatory Visit: Payer: Self-pay

## 2019-07-27 ENCOUNTER — Ambulatory Visit (INDEPENDENT_AMBULATORY_CARE_PROVIDER_SITE_OTHER): Payer: Self-pay | Admitting: Nurse Practitioner

## 2019-07-27 VITALS — BP 140/77 | HR 71

## 2019-07-27 DIAGNOSIS — R634 Abnormal weight loss: Secondary | ICD-10-CM

## 2019-07-27 DIAGNOSIS — R42 Dizziness and giddiness: Secondary | ICD-10-CM

## 2019-07-27 DIAGNOSIS — R058 Other specified cough: Secondary | ICD-10-CM

## 2019-07-27 DIAGNOSIS — R05 Cough: Secondary | ICD-10-CM

## 2019-07-27 DIAGNOSIS — R001 Bradycardia, unspecified: Secondary | ICD-10-CM

## 2019-07-27 DIAGNOSIS — Z720 Tobacco use: Secondary | ICD-10-CM

## 2019-07-27 MED ORDER — MECLIZINE HCL 12.5 MG PO TABS: 12.5000 mg | ORAL_TABLET | Freq: Three times a day (TID) | ORAL | 0 refills | Status: DC | PRN

## 2019-07-27 NOTE — Progress Notes (Signed)
Telemedicine Encounter: Disclosed to patient at start of encounter that we will provide appropriate telemedicine services.  Patient consents to be treated via phone prior to discussion. - Patient is at his home and is accessed via telephone. - Services are provided by Cassell Smiles from [facility].  Subjective:    Patient ID: Anthony Nguyen, male    DOB: 06-29-1956, 63 y.o.   MRN: [id number]  Anthony Nguyen is a 63 y.o. male presenting on 07/27/2019 for Hypertension (Hypertension accompanied with lightheadedness)  HPI Lightheadedness, productive cough When he blows his nose, feels congestion/increased pressure Has felt sick for about 6 months, last 2 days feels normal except productive cough and lightheadedness. - Patient had prostate checked by Dr. Boneta Lucks.  "Has small hole in bladder." - patient was on antibiotics for 28 days.  Patient did not recover from this antibiotic yet. He has had prostate exam and cystoscope.  Patient took methylene blue short term.  Patient is not sure which antibiotic   Pulse kept dropping to 37 when he was at [facility].  HR at home on checks have been normal.  - Patient has appointment Next Friday in [facility] for GI for abdominal pain.  Dizzy with position changes. 127/69 Pulse 72; Patient reports BP 101/63 in past.  Patient is most concerned with dropping too low.      Patient has lost 35 lbs over last 2 months.  Patient looks pale per his wife.  Patient has feeling that something is not right, continues to seek care in multiple locations trying to figure this out.  Social History   Tobacco Use  . Smoking status: Current Every Day Smoker    Packs/day: 2.00    Years: 30.00    Pack years: 60.00    Types: Cigarettes  . Smokeless tobacco: Current User  Substance Use Topics  . Alcohol use: No    Frequency: Never  . Drug use: No    Review of Systems Per HPI unless specifically indicated above     Objective:    BP 140/77 (BP  Location: Left Arm, Patient Position: Sitting)   Pulse 71    Wt Readings from Last 3 Encounters:  03/22/19 225 lb (102.1 kg)  03/20/19 230 lb (104.3 kg)  02/28/19 225 lb (102.1 kg)    Physical Exam Patient remotely monitored.  Verbal communication appropriate.  Cognition normal.  Results for orders placed or performed during the hospital encounter of 04/18/19  Comprehensive metabolic panel  Result Value Ref Range   Sodium 143 135 - 145 mmol/L   Potassium 3.7 3.5 - 5.1 mmol/L   Chloride 108 98 - 111 mmol/L   CO2 24 22 - 32 mmol/L   Glucose, Bld 102 (H) 70 - 99 mg/dL   BUN 13 6 - 20 mg/dL   Creatinine, Ser 0.86 0.61 - 1.24 mg/dL   Calcium 9.6 8.9 - 10.3 mg/dL   Total Protein 7.4 6.5 - 8.1 g/dL   Albumin 4.6 3.5 - 5.0 g/dL   AST 13 (L) 15 - 41 U/L   ALT 15 0 - 44 U/L   Alkaline Phosphatase 66 38 - 126 U/L   Total Bilirubin 0.6 0.3 - 1.2 mg/dL   GFR calc non Af Amer >60 >60 mL/min   GFR calc Af Amer >60 >60 mL/min   Anion gap 11 5 - 15  Ethanol  Result Value Ref Range   Alcohol, Ethyl (B) <10 <10 mg/dL  CBC with Differential  Result Value  Ref Range   WBC 8.0 4.0 - 10.5 K/uL   RBC 4.35 4.22 - 5.81 MIL/uL   Hemoglobin 13.1 13.0 - 17.0 g/dL   HCT 41.0 39.0 - 52.0 %   MCV 94.3 80.0 - 100.0 fL   MCH 30.1 26.0 - 34.0 pg   MCHC 32.0 30.0 - 36.0 g/dL   RDW 12.1 11.5 - 15.5 %   Platelets 170 150 - 400 K/uL   nRBC 0.0 0.0 - 0.2 %   Neutrophils Relative % 64 %   Neutro Abs 5.1 1.7 - 7.7 K/uL   Lymphocytes Relative 23 %   Lymphs Abs 1.9 0.7 - 4.0 K/uL   Monocytes Relative 7 %   Monocytes Absolute 0.6 0.1 - 1.0 K/uL   Eosinophils Relative 5 %   Eosinophils Absolute 0.4 0.0 - 0.5 K/uL   Basophils Relative 1 %   Basophils Absolute 0.1 0.0 - 0.1 K/uL   Immature Granulocytes 0 %   Abs Immature Granulocytes 0.01 0.00 - 0.07 K/uL  Urinalysis, Complete w Microscopic  Result Value Ref Range   Color, Urine YELLOW (A) YELLOW   APPearance CLEAR (A) CLEAR   Specific Gravity, Urine  1.025 1.005 - 1.030   pH 6.0 5.0 - 8.0   Glucose, UA NEGATIVE NEGATIVE mg/dL   Hgb urine dipstick NEGATIVE NEGATIVE   Bilirubin Urine NEGATIVE NEGATIVE   Ketones, ur NEGATIVE NEGATIVE mg/dL   Protein, ur NEGATIVE NEGATIVE mg/dL   Nitrite NEGATIVE NEGATIVE   Leukocytes,Ua NEGATIVE NEGATIVE   RBC / HPF 0-5 0 - 5 RBC/hpf   WBC, UA 0-5 0 - 5 WBC/hpf   Bacteria, UA NONE SEEN NONE SEEN   Squamous Epithelial / LPF 0-5 0 - 5   Mucus PRESENT   Urine Drug Screen, Qualitative  Result Value Ref Range   Tricyclic, Ur Screen NONE DETECTED NONE DETECTED   Amphetamines, Ur Screen NONE DETECTED NONE DETECTED   MDMA (Ecstasy)Ur Screen NONE DETECTED NONE DETECTED   Cocaine Metabolite,Ur [place] NONE DETECTED NONE DETECTED   Opiate, Ur Screen NONE DETECTED NONE DETECTED   Phencyclidine (PCP) Ur S NONE DETECTED NONE DETECTED   Cannabinoid 50 Ng, Ur [place] NONE DETECTED NONE DETECTED   Barbiturates, Ur Screen NONE DETECTED NONE DETECTED   Benzodiazepine, Ur Scrn POSITIVE (A) NONE DETECTED   Methadone Scn, Ur NONE DETECTED NONE DETECTED      Assessment & Plan:   Problem List Items Addressed This Visit    None    Visit Diagnoses    Postural dizziness    -  Primary   Relevant Medications   meclizine (ANTIVERT) 12.5 MG tablet   Other Relevant Orders   Ambulatory referral to Cardiology   Tobacco use       Relevant Orders   CT SUPER D CHEST W WO CONTRAST   Bradycardia       Relevant Orders   Ambulatory referral to Cardiology   Productive cough       Relevant Orders   CT SUPER D CHEST W WO CONTRAST   Weight loss, abnormal       Relevant Orders   CT SUPER D CHEST W WO CONTRAST    Postural dizziness, bradycardia Bradycardia is possibly contributing to postural dizziness.  Cannot exclude vertigo without physical exam. -Referral to cardiology -Start meclizine 12.5 mg 3 times daily as needed dizziness -Follow-up 5 to 7 days if worsening symptoms.  Tobacco use, weight loss Patient with rapid weight  loss, productive cough and no evidence of  pneumonia on x-ray or other infectious causes of productive cough.  Cannot fully exclude post viral etiology of cough, but concerned for malignancy. -CT chest ordered today -Follow-up after imaging completed  Meds ordered this encounter  Medications  . meclizine (ANTIVERT) 12.5 MG tablet    Sig: Take 1 tablet (12.5 mg total) by mouth 3 (three) times daily as needed for dizziness.    Dispense:  30 tablet    Refill:  0    Order Specific Question:   Supervising Provider    Answer:   Olin Hauser [[id number]]   - Time spent in direct consultation with patient via telemedicine about above concerns: 18 minutes  Follow up plan: Return 5-7 days if symptoms worsen or fail to improve.  Cassell Smiles, DNP, AGPCNP-BC Adult Gerontology Primary Care Nurse Practitioner [facility] 07/27/2019, 8:39 AM

## 2019-08-03 ENCOUNTER — Telehealth: Payer: Self-pay | Admitting: Nurse Practitioner

## 2019-08-03 ENCOUNTER — Telehealth: Payer: Self-pay

## 2019-08-03 DIAGNOSIS — Z720 Tobacco use: Secondary | ICD-10-CM

## 2019-08-03 MED ORDER — BUPROPION HCL ER (SR) 150 MG PO TB12: 150.0000 mg | ORAL_TABLET | Freq: Two times a day (BID) | ORAL | 2 refills | Status: DC

## 2019-08-03 NOTE — Telephone Encounter (Signed)
The pt called requesting chantix or some type of medication to help him quit smoking. He states that he is now enrolled in [facility]. They informed him to contact our office to request a prescription to help him quit smoking. His desire is to quit before his birth day on [birth year]. The pt would like you to keep in mind that he is uninsured when you decide on a prescription. Currently he's using [facility] to help with the cost of his medications.

## 2019-08-03 NOTE — Telephone Encounter (Signed)
Pt called requesting prescription for chantix

## 2019-08-03 NOTE — Telephone Encounter (Signed)
Chantix with [facility] is approx $450.  For your smoking cessation, we are going to start Wellbutrin: Start bupropion SR (Wellbutrin SR) 150mg  tablet 7 days BEFORE your quit date.    Your quit date _______      START Wellbutrin on: _______  - Take Wellbutrin 150mg  1 tablet once daily for 3 days - Then, take 1 tablet TWICE daily (about every 12 hours) and continue at this dose for 7 weeks.  To quit smoking:  - Only start this treatment if you are mentally ready to quit. - Choose a quit date at least 7 days in the future.  - Start taking the medicine about 1-2 weeks before your quit date.  - Reduce the number of cigarettes you smoke each day you are on this medication until you eventually QUIT COMPLETELY on your quit date. In some cases, you may not be completely quit on your quit date.  Continue reducing by 50% each week until completely quit. - Continue taking the Wellbutrin twice daily for total 7 weeks, we can continue this medication for up to 12 weeks.   I also sent this to MyChart.

## 2019-08-04 NOTE — Telephone Encounter (Signed)
The pt states that he contact the Chantix manufacturer and was approved to get the medication free.  He state that they suppose to send this inform to [place] for a signature. I informed the patient about the Wellbutrin and he states that he would prefer to start that medication first, because he's unsure how long the process going to be before he receives the chantix.

## 2019-08-08 ENCOUNTER — Other Ambulatory Visit: Payer: Self-pay | Admitting: Nurse Practitioner

## 2019-08-08 DIAGNOSIS — M25562 Pain in left knee: Secondary | ICD-10-CM

## 2019-08-08 DIAGNOSIS — G8929 Other chronic pain: Secondary | ICD-10-CM

## 2019-08-08 NOTE — Telephone Encounter (Signed)
Pt  Called requesting refill on diclofenac sodium 3%

## 2019-08-09 ENCOUNTER — Encounter: Payer: Self-pay | Admitting: Nurse Practitioner

## 2019-08-09 MED ORDER — DICLOFENAC SODIUM 3 % TD GEL: 1.0000 "application " | Freq: Three times a day (TID) | TRANSDERMAL | 1 refills | Status: DC | PRN

## 2019-08-28 ENCOUNTER — Encounter: Payer: Self-pay | Admitting: Student in an Organized Health Care Education/Training Program

## 2019-08-28 ENCOUNTER — Telehealth: Payer: Self-pay | Admitting: Nurse Practitioner

## 2019-08-28 ENCOUNTER — Other Ambulatory Visit: Payer: Self-pay | Admitting: Nurse Practitioner

## 2019-08-28 DIAGNOSIS — G5631 Lesion of radial nerve, right upper limb: Secondary | ICD-10-CM

## 2019-08-28 NOTE — Telephone Encounter (Signed)
Pt.called requesting refill on zanaflex

## 2019-08-29 ENCOUNTER — Encounter: Payer: Self-pay | Admitting: Student in an Organized Health Care Education/Training Program

## 2019-08-29 ENCOUNTER — Ambulatory Visit
Payer: No Typology Code available for payment source | Attending: Student in an Organized Health Care Education/Training Program | Admitting: Student in an Organized Health Care Education/Training Program

## 2019-08-29 ENCOUNTER — Other Ambulatory Visit: Payer: Self-pay

## 2019-08-29 DIAGNOSIS — G894 Chronic pain syndrome: Secondary | ICD-10-CM

## 2019-08-29 DIAGNOSIS — S32009D Unspecified fracture of unspecified lumbar vertebra, subsequent encounter for fracture with routine healing: Secondary | ICD-10-CM

## 2019-08-29 DIAGNOSIS — Z96652 Presence of left artificial knee joint: Secondary | ICD-10-CM | POA: Diagnosis not present

## 2019-08-29 DIAGNOSIS — M48061 Spinal stenosis, lumbar region without neurogenic claudication: Secondary | ICD-10-CM | POA: Diagnosis not present

## 2019-08-29 DIAGNOSIS — M25562 Pain in left knee: Secondary | ICD-10-CM

## 2019-08-29 DIAGNOSIS — M5136 Other intervertebral disc degeneration, lumbar region: Secondary | ICD-10-CM

## 2019-08-29 DIAGNOSIS — M5416 Radiculopathy, lumbar region: Secondary | ICD-10-CM | POA: Diagnosis not present

## 2019-08-29 DIAGNOSIS — M170 Bilateral primary osteoarthritis of knee: Secondary | ICD-10-CM

## 2019-08-29 DIAGNOSIS — G8929 Other chronic pain: Secondary | ICD-10-CM

## 2019-08-29 MED ORDER — OXYCODONE HCL 10 MG PO TABS: 10.0000 mg | ORAL_TABLET | Freq: Three times a day (TID) | ORAL | 0 refills | Status: DC | PRN

## 2019-08-29 MED ORDER — OXYCODONE HCL 10 MG PO TABS: 10.0000 mg | ORAL_TABLET | Freq: Three times a day (TID) | ORAL | 0 refills | Status: AC | PRN

## 2019-08-29 MED ORDER — TIZANIDINE HCL 4 MG PO TABS: 4.0000 mg | ORAL_TABLET | Freq: Three times a day (TID) | ORAL | 0 refills | Status: DC | PRN

## 2019-08-29 NOTE — Telephone Encounter (Signed)
Patient advised to check with pharmacy he might have refill left previous one was Rx in 06/2019 180 tab for 30 days with 1 refill. As per patient he didn't receive refill so checking with the pharmacy.

## 2019-08-29 NOTE — Progress Notes (Signed)
Pain Management Virtual Encounter Note - Virtual Visit via [place] (real-time audio visits between healthcare provider and patient).   Patient's Phone No. & Preferred Pharmacy:  [phone] (home); [phone] (mobile); (Preferred) [phone] [email or web address]  [facility] - [place] [address], [place] - [address] [place] [address] Phone: [phone] Fax: [place], [place] [address] [place] [address] Phone: [phone] Fax: [place] [address]), [place] - [place]) [place] [address] Phone: [phone] Fax: [phone]    Pre-screening note:  Our staff contacted Mr. Melcher and offered him an "in person", "face-to-face" appointment versus a telephone encounter. He indicated preferring the telephone encounter, at this time.   Reason for Virtual Visit: COVID-19*  Social distancing based on CDC and AMA recommendations.   I contacted Jassiah Liberti Dandy on 08/29/2019 via video conference.      I clearly identified myself as [name], MD. I verified that I was speaking with the correct person using two identifiers (Name: ROOSEVELT GOMEZGARCIA, and date of birth: November 10, 1955).  Advanced Informed Consent I sought verbal advanced consent from [place] for virtual visit interactions. I informed Mr. Siragusa of possible security and privacy concerns, risks, and limitations associated with providing "not-in-person" medical evaluation and management services. I also informed Mr. Nogueira of the availability of "in-person" appointments. Finally, I informed him that there would be a charge for the virtual visit and that he could be  personally, fully or partially, financially responsible for it. Mr. Heyer expressed understanding and agreed to proceed.   Historic Elements   Mr. HILDRED WICKE is a 63 y.o. year old, male patient evaluated today after his last encounter by our practice on 05/30/2019. Mr. Kosmicki  has a past medical history of Allergy, Arthritis, Cancer ([place]), Chronic back pain, Current every day smoker, Diverticulosis, GERD (gastroesophageal reflux disease), H/O diverticulitis of colon, Restless leg syndrome, and Sciatic pain. 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 Mr. Arnall has a current medication list which includes the following prescription(s): diclofenac sodium, uribel, oxycodone hcl, oxycodone hcl, oxycodone hcl, tizanidine, and pantoprazole. He  reports that he has been smoking cigarettes. He has a 60.00 pack-year smoking history. He uses smokeless tobacco. He reports that he does not drink alcohol or use drugs. Mr. Steeno has No Known Allergies.   HPI  Today, he is being contacted for medication management.   No change in medical history since last visit.  Patient's pain is at baseline.  Patient continues multimodal pain regimen as prescribed.  States that it provides pain relief and improvement in functional status.  Pharmacotherapy Assessment  Analgesic:  08/02/2019  1   05/30/2019  Oxycodone Hcl 10 MG Tablet  75.00  30 Bi Lat   [id number]   [place] ([id number])   0  37.50 MME  Comm Ins   [place]  07/03/2019  1   05/30/2019  Oxycodone Hcl 10 MG Tablet  75.00  30 Bi Lat   [id number]   [place] ([id number])   0  37.50 MME  Comm Ins   [place]     Monitoring: Pharmacotherapy: No side-effects or adverse reactions reported. [place] PMP: PDMP reviewed during this encounter.       Compliance: No problems identified. Effectiveness: Clinically acceptable.  Plan: Refer to "POC".  UDS:  Summary  Date Value Ref Range Status  12/28/2018 FINAL  Final    Comment:     ==================================================================== TOXASSURE SELECT 13 (MW) ==================================================================== Test                             Result       Flag       Units Drug Present and Declared for Prescription Verification   Oxycodone                      314          EXPECTED   ng/mg creat   Oxymorphone                    331          EXPECTED   ng/mg creat   Noroxycodone                   1838         EXPECTED   ng/mg creat   Noroxymorphone                 130          EXPECTED   ng/mg creat    Sources of oxycodone are scheduled prescription medications.    Oxymorphone, noroxycodone, and noroxymorphone are expected    metabolites of oxycodone. Oxymorphone is also available as a    scheduled prescription medication. ==================================================================== Test                      Result    Flag   Units      Ref Range   Creatinine              102              mg/dL      >=20 ==================================================================== Declared Medications:  The flagging and interpretation on this report are based on the  following declared medications.  Unexpected results may arise from  inaccuracies in the declared medications.  **Note: The testing scope of this panel includes these medications:  Oxycodone  **Note: The testing scope of this panel does not include following  reported medications:  Diclofenac  Lisinopril  Melatonin ==================================================================== For clinical consultation, please call [phone]. ====================================================================    Laboratory Chemistry Profile (12 mo)  Renal: 04/18/2019: BUN 13; Creatinine, Ser 0.86  Lab Results  Component Value Date   GFR 68.66 04/02/2014   GFRAA >60 04/18/2019   GFRNONAA >60 04/18/2019   Hepatic: 04/18/2019: Albumin 4.6 Lab Results  Component Value Date    AST 13 (L) 04/18/2019   ALT 15 04/18/2019   Other: No results found for requested labs within last 8760 hours. Note: Above Lab results reviewed.  Assessment  The primary encounter diagnosis was Lumbar foraminal stenosis. Diagnoses of Primary osteoarthritis of both knees, Lumbar radiculopathy, History of left knee replacement, Lumbar degenerative disc disease, Closed fracture of transverse process of lumbar vertebra with routine healing, subsequent encounter, Chronic pain of left knee, and Chronic pain syndrome were also pertinent to this visit.  Plan of Care  I have discontinued Shanon Brow L. Shults's albuterol, budesonide-formoterol, lisinopril, meclizine, and buPROPion. I am also having him start on Oxycodone HCl and Oxycodone HCl. Additionally, I am having him maintain his pantoprazole, tiZANidine, Diclofenac Sodium, Uribel, and Oxycodone HCl.  Continue Tizanidine and  APAP prn, no Rx needed. UDS up-to-date and appropriate.  Patient states that he still utilizes 2 to 3 tablets a day for his knee pain in his low back pain.  Pharmacotherapy (Medications Ordered): Meds ordered this encounter  Medications  . Oxycodone HCl 10 MG TABS    Sig: Take 1 tablet (10 mg total) by mouth every 8 (eight) hours as needed. Must last 30 days.    Dispense:  75 tablet    Refill:  0    Chronic Pain. (STOP Act - Not applicable). Fill one day early if closed on scheduled refill date. Max 75/month.  . Oxycodone HCl 10 MG TABS    Sig: Take 1 tablet (10 mg total) by mouth every 8 (eight) hours as needed. Must last 30 days.    Dispense:  75 tablet    Refill:  0    Chronic Pain. (STOP Act - Not applicable). Fill one day early if closed on scheduled refill date. Max 75/month.  . Oxycodone HCl 10 MG TABS    Sig: Take 1 tablet (10 mg total) by mouth every 8 (eight) hours as needed. Must last 30 days.    Dispense:  75 tablet    Refill:  0    Chronic Pain. (STOP Act - Not applicable). Fill one day early if closed  on scheduled refill date. Max 75/month.   Follow-up plan:   Return in about 3 months (around 11/29/2019) for Medication Management.    Recent Visits No visits were found meeting these conditions.  Showing recent visits within past 90 days and meeting all other requirements   Today's Visits Date Type Provider Dept  08/29/19 Office Visit [name], MD [facility]  Showing today's visits and meeting all other requirements   Future Appointments No visits were found meeting these conditions.  Showing future appointments within next 90 days and meeting all other requirements   I discussed the assessment and treatment plan with the patient. The patient was provided an opportunity to ask questions and all were answered. The patient agreed with the plan and demonstrated an understanding of the instructions.  Patient advised to call back or seek an in-person evaluation if the symptoms or condition worsens.  Total duration of non-face-to-face encounter: 25 minutes.  Note by: [name], MD Date: 08/29/2019; Time: 8:48 AM  Note: This dictation was prepared with Dragon dictation. Any transcriptional errors that may result from this process are unintentional.  Disclaimer:  * Given the special circumstances of the COVID-19 pandemic, the federal government has announced that the Office for Civil Rights (OCR) will exercise its enforcement discretion and will not impose penalties on physicians using telehealth in the event of noncompliance with regulatory requirements under the [place] (HIPAA) in connection with the good faith provision of telehealth during the XX123456 national public health emergency. ([place])

## 2019-08-29 NOTE — Addendum Note (Signed)
Addended by: Olin Hauser on: 08/29/2019 05:13 PM   Modules accepted: Orders

## 2019-08-31 ENCOUNTER — Encounter
Payer: No Typology Code available for payment source | Admitting: Student in an Organized Health Care Education/Training Program

## 2019-11-20 ENCOUNTER — Telehealth: Payer: Self-pay | Admitting: Nurse Practitioner

## 2019-11-20 ENCOUNTER — Other Ambulatory Visit: Payer: Self-pay | Admitting: Family Medicine

## 2019-11-20 DIAGNOSIS — G5631 Lesion of radial nerve, right upper limb: Secondary | ICD-10-CM

## 2019-11-20 NOTE — Telephone Encounter (Signed)
Pt called requesting refill on Zanaflex 4 mg

## 2019-11-20 NOTE — Telephone Encounter (Signed)
Already send to cosign.

## 2019-11-27 ENCOUNTER — Telehealth: Payer: Self-pay

## 2019-11-27 NOTE — Telephone Encounter (Signed)
Left message for patient to call us back for pre virtual appointment questions.  °

## 2019-11-28 ENCOUNTER — Other Ambulatory Visit: Payer: Self-pay

## 2019-11-28 ENCOUNTER — Ambulatory Visit
Payer: Self-pay | Attending: Student in an Organized Health Care Education/Training Program | Admitting: Student in an Organized Health Care Education/Training Program

## 2019-11-28 ENCOUNTER — Encounter: Payer: Self-pay | Admitting: Student in an Organized Health Care Education/Training Program

## 2019-11-28 DIAGNOSIS — M5416 Radiculopathy, lumbar region: Secondary | ICD-10-CM

## 2019-11-28 DIAGNOSIS — M25562 Pain in left knee: Secondary | ICD-10-CM

## 2019-11-28 DIAGNOSIS — Z96652 Presence of left artificial knee joint: Secondary | ICD-10-CM

## 2019-11-28 DIAGNOSIS — M48061 Spinal stenosis, lumbar region without neurogenic claudication: Secondary | ICD-10-CM

## 2019-11-28 DIAGNOSIS — G894 Chronic pain syndrome: Secondary | ICD-10-CM

## 2019-11-28 DIAGNOSIS — M170 Bilateral primary osteoarthritis of knee: Secondary | ICD-10-CM

## 2019-11-28 DIAGNOSIS — G8929 Other chronic pain: Secondary | ICD-10-CM

## 2019-11-28 MED ORDER — OXYCODONE HCL 10 MG PO TABS: 10.0000 mg | ORAL_TABLET | Freq: Three times a day (TID) | ORAL | 0 refills | Status: AC | PRN

## 2019-11-28 NOTE — Progress Notes (Signed)
Patient: Anthony Nguyen  Service Category: E/M  Provider: [name], MD  DOB: 08-Dec-1955  DOS: 11/28/2019  Location: Office  MRN: [id number]  Setting: Ambulatory outpatient  Referring Provider: No ref. provider found  Type: Established Patient  Specialty: Interventional Pain Management  PCP: Mikey College, NP (Inactive)  Location: Home  Delivery: TeleHealth     Virtual Encounter - Pain Management PROVIDER NOTE: Information contained herein reflects review and annotations entered in association with encounter. Interpretation of such information and data should be left to medically-trained personnel. Information provided to patient can be located elsewhere in the medical record under "Patient Instructions". Document created using STT-dictation technology, any transcriptional errors that may result from process are unintentional.    Contact & Pharmacy Preferred: [phone] Home: [phone] (home) Mobile: [phone] (mobile) E-mail: [email or web address]  Anthony Nguyen, Anthony [place] [facility] [place] Anthony Nguyen Phone: [phone] Fax: [place], [place] [address] [place] [address] Phone: [phone] Fax: Anthony [address]), Anthony Nguyen) [place] [address] Phone: [phone] Fax: [phone]   Pre-screening  Anthony Nguyen offered "in-person" vs "virtual" encounter. He indicated preferring virtual for this encounter.   Reason COVID-19*  Social distancing based on CDC and AMA recommendations.   I contacted Anthony Nguyen on 11/28/2019 via telephone.      I clearly identified myself as [name], MD. I verified that I was speaking with the correct person using two identifiers (Name: AMRO WINEBARGER, and date of birth: 1956/09/27).  This visit was completed via telephone due to  the restrictions of the COVID-19 pandemic. All issues as above were discussed and addressed but no physical exam was performed. If it was felt that the patient should be evaluated in the office, they were directed there. The patient verbally consented to this visit. Patient was unable to complete an audio/visual visit due to Technical difficulties and/or Lack of internet. Due to the catastrophic nature of the COVID-19 pandemic, this visit was done through audio contact only.  Location of the patient: home address (see Epic for details)  Location of the provider: office  Consent I sought verbal advanced consent from Anthony Nguyen for virtual visit interactions. I informed Anthony Nguyen of possible security and privacy concerns, risks, and limitations associated with providing "not-in-person" medical evaluation and management services. I also informed Anthony Nguyen of the availability of "in-person" appointments. Finally, I informed him that there would be a charge for the virtual visit and that he could be  personally, fully or partially, financially responsible for it. Anthony Nguyen expressed understanding and agreed to proceed.   Historic Elements   Mr. ADLAI SINNING is a 64 y.o. year old, male patient evaluated today after his last contact with our practice on 11/27/2019. Mr. Shahan  has a past medical history of Allergy, Arthritis, Cancer ([place]), Chronic back pain, Current every day smoker, Diverticulosis, GERD (gastroesophageal reflux disease), H/O diverticulitis of colon, Restless leg syndrome, and Sciatic pain. 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 Mr. Settles has a current medication list which includes the following prescription(s): albuterol, vitamin c, vitamin d3, diclofenac sodium, uribel, midodrine, omega-3, [START ON  11/30/2019] oxycodone hcl, [START ON 12/30/2019] oxycodone hcl, [START ON 01/29/2020]  oxycodone hcl, tizanidine, vitamin b-12, zinc, and pantoprazole. He  reports that he has been smoking cigarettes. He has a 60.00 pack-year smoking history. He uses smokeless tobacco. He reports that he does not drink alcohol or use drugs. Mr. Kitagawa has No Known Allergies.   HPI  Today, he is being contacted for medication management.    Patient continues multimodal pain regimen as prescribed.  States that it provides pain relief and improvement in functional status.   Patient is having increased pain in his low back with radiates down bilateral buttocks more pronounced on the right to his posterior lateral thigh in a dermatomal distribution.  He would like to repeat spine injection that he has had in the past.  Pharmacotherapy Assessment  Analgesic:  10/31/2019  1   08/29/2019  Oxycodone Hcl 10 MG Tablet  75.00  25 Bi Lat   [id number]   Haw ([id number])   0  45.00 MME  Comm Ins   [place]    Monitoring: [place] PMP: PDMP reviewed during this encounter.       Pharmacotherapy: No side-effects or adverse reactions reported. Compliance: No problems identified. Effectiveness: Clinically acceptable. Plan: Refer to "POC".  UDS:  Summary  Date Value Ref Range Status  12/28/2018 FINAL  Final    Comment:    ==================================================================== TOXASSURE SELECT 13 (MW) ==================================================================== Test                             Result       Flag       Units Drug Present and Declared for Prescription Verification   Oxycodone                      314          EXPECTED   ng/mg creat   Oxymorphone                    331          EXPECTED   ng/mg creat   Noroxycodone                   1838         EXPECTED   ng/mg creat   Noroxymorphone                 130          EXPECTED   ng/mg creat    Sources of oxycodone are scheduled prescription medications.     Oxymorphone, noroxycodone, and noroxymorphone are expected    metabolites of oxycodone. Oxymorphone is also available as a    scheduled prescription medication. ==================================================================== Test                      Result    Flag   Units      Ref Range   Creatinine              102              mg/dL      >=20 ==================================================================== Declared Medications:  The flagging and interpretation on this report are based on the  following declared medications.  Unexpected results may arise from  inaccuracies in the declared medications.  **Note: The testing scope of this panel includes these medications:  Oxycodone  **Note: The testing scope of this panel does not include following  reported medications:  Diclofenac  Lisinopril  Melatonin ==================================================================== For clinical consultation, please call [phone]. ====================================================================    Laboratory Chemistry Profile   Renal Lab Results  Component Value Date   BUN 13 04/18/2019   CREATININE 0.86 04/18/2019   GFR 68.66 04/02/2014   GFRAA >60 04/18/2019   GFRNONAA >60 04/18/2019    Hepatic Lab Results  Component Value Date   AST 13 (L) 04/18/2019   ALT 15 04/18/2019   ALBUMIN 4.6 04/18/2019   ALKPHOS 66 04/18/2019   LIPASE 44 03/22/2019    Electrolytes Lab Results  Component Value Date   NA 143 04/18/2019   K 3.7 04/18/2019   CL 108 04/18/2019   CALCIUM 9.6 04/18/2019    Bone No results found for: [place], [id number], [id number], [id number], 25OHVITD1, 25OHVITD2, 25OHVITD3, TESTOFREE, TESTOSTERONE  Coagulation Lab Results  Component Value Date   INR 0.96 09/29/2017   LABPROT 12.7 09/29/2017   APTT 31 09/29/2017   PLT 170 04/18/2019    Cardiovascular Lab Results  Component Value Date   TROPONINI <0.03 03/22/2019   HGB 13.1 04/18/2019   HCT  41.0 04/18/2019    Inflammation (CRP: Acute Phase) (ESR: Chronic Phase) Lab Results  Component Value Date   ESRSEDRATE 30 (H) 09/29/2017      Note: Above Lab results reviewed.  Imaging  DG Chest 2 View CLINICAL DATA:  Productive cough x2 months as well as diaphoresis with activity. Patient states that he feels like he has phlegm in his throat and chest that he cannot cough up. Per patient request patient had 3 negative covid tests. Current smoker.  EXAM: CHEST - 2 VIEW  COMPARISON:  04/18/2019 chest x-ray and 11/07/2014 chest CT  FINDINGS: Cardiomediastinal silhouette is normal. The lungs are free of focal consolidations and pleural effusions. There is mild prominence of interstitial markings slightly increased compared to prior study. No evidence for pulmonary edema. Mild degenerative changes in the thoracic spine.  IMPRESSION: Mildly prominent interstitial markings, consistent with viral infection or inflammatory process. No consolidations.  Electronically Signed   By: Nolon Nations M.D.   On: 05/12/2019 15:16  Assessment  The primary encounter diagnosis was Lumbar radiculopathy. Diagnoses of Lumbar foraminal stenosis, Primary osteoarthritis of both knees, History of left knee replacement, Chronic pain of left knee, and Chronic pain syndrome were also pertinent to this visit.  Plan of Care  I am having Anthony Nguyen start on Oxycodone HCl and Oxycodone HCl. I am also having him maintain his pantoprazole, Diclofenac Sodium, Uribel, tiZANidine, albuterol, midodrine, Omega-3, Zinc, Vitamin D3, vitamin C, vitamin B-12, and Oxycodone HCl.  Pharmacotherapy (Medications Ordered): Meds ordered this encounter  Medications  . Oxycodone HCl 10 MG TABS    Sig: Take 1 tablet (10 mg total) by mouth every 8 (eight) hours as needed. Must last 30 days.    Dispense:  75 tablet    Refill:  0    Chronic Pain. (STOP Act - Not applicable). Fill one day early if closed on  scheduled refill date. Max 75/month.  . Oxycodone HCl 10 MG TABS    Sig: Take 1 tablet (10 mg total) by mouth every 8 (eight) hours as needed. Must last 30 days.    Dispense:  75 tablet    Refill:  0    Chronic Pain. (STOP Act - Not applicable). Fill one day early if closed on scheduled refill date. Max 75/month.  . Oxycodone HCl 10 MG TABS    Sig: Take 1 tablet (10 mg total) by mouth every  8 (eight) hours as needed. Must last 30 days.    Dispense:  75 tablet    Refill:  0    Chronic Pain. (STOP Act - Not applicable). Fill one day early if closed on scheduled refill date. Max 75/month.   Orders:  Orders Placed This Encounter  Procedures  . Lumbar Epidural Injection    Standing Status:   Future    Standing Expiration Date:   12/26/2019    Scheduling Instructions:     Procedure: Interlaminar Lumbar Epidural Steroid injection (LESI)            Laterality: Midline     Sedation: without     Timeframe: ASAA    Order Specific Question:   Where will this procedure be performed?    Answer:   [facility]  . ToxASSURE Select 13 (MW), Urine    Volume: 30 ml(s). Minimum 3 ml of urine is needed. Document temperature of fresh sample. Indications: Long term (current) use of opiate analgesic (V50.016)   Follow-up plan:   Return in about 2 weeks (around 12/12/2019) for L-ESI (get UDS same day), without sedation.    Recent Visits No visits were found meeting these conditions.  Showing recent visits within past 90 days and meeting all other requirements   Today's Visits Date Type Provider Dept  11/28/19 Office Visit [name], MD [facility]  Showing today's visits and meeting all other requirements   Future Appointments No visits were found meeting these conditions.  Showing future appointments within next 90 days and meeting all other requirements   I discussed the assessment and treatment plan with the patient. The patient was provided an opportunity to ask questions  and all were answered. The patient agreed with the plan and demonstrated an understanding of the instructions.  Patient advised to call back or seek an in-person evaluation if the symptoms or condition worsens.  Duration of encounter: 25 minutes.  Note by: [name], MD Date: 11/28/2019; Time: 10:57 AM

## 2019-12-04 LAB — HM COLONOSCOPY

## 2019-12-13 ENCOUNTER — Other Ambulatory Visit: Payer: Self-pay | Admitting: Nurse Practitioner

## 2019-12-13 DIAGNOSIS — G5631 Lesion of radial nerve, right upper limb: Secondary | ICD-10-CM

## 2019-12-15 ENCOUNTER — Encounter: Payer: Self-pay | Admitting: Family Medicine

## 2020-01-01 IMAGING — US ULTRASOUND ABDOMEN LIMITED
1 series · 14 of 25 positions shown · non-contrast
Comparison: None.

CLINICAL DATA: 62-year-old male with a history of right upper
quadrant pain

EXAM:
ULTRASOUND ABDOMEN LIMITED RIGHT UPPER QUADRANT

[Series 1: ultrasound abdomen limited · 0.25mm/px · 14 of 63 slices shown]
[im 1/63]
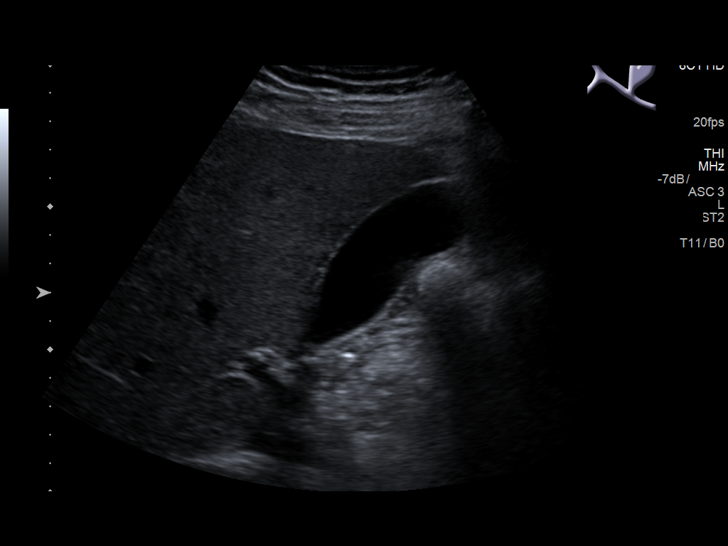
[im 6/63]
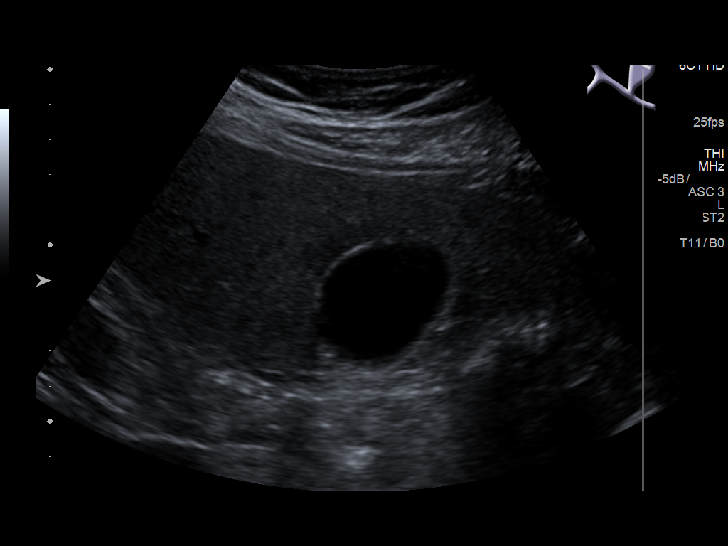
[im 11/63]
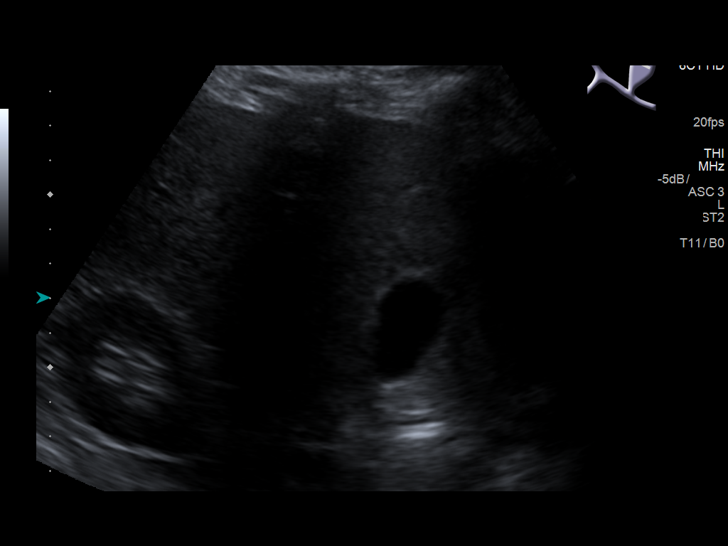
[im 16/63]
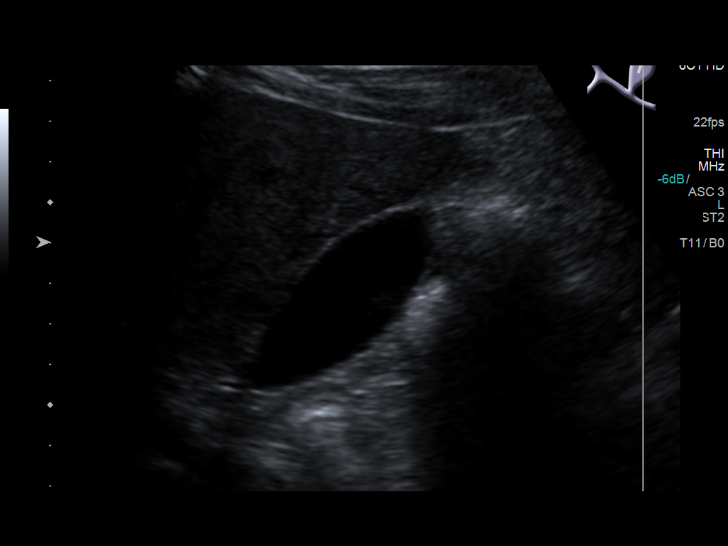
[im 21/63]
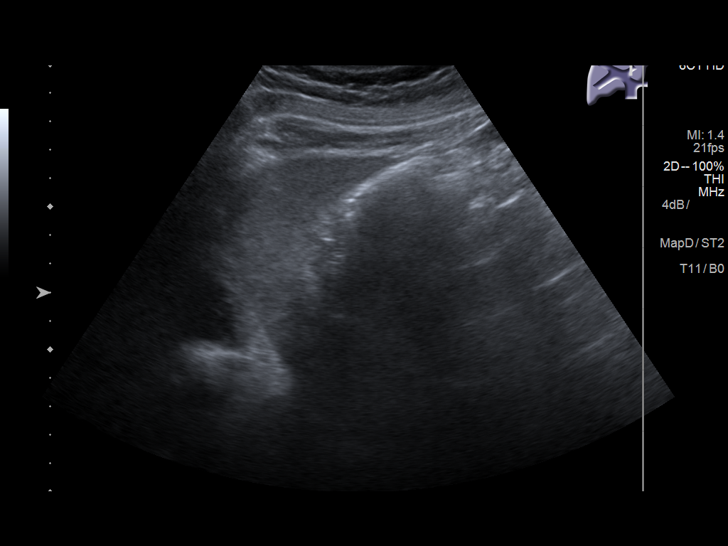
[im 24/63]
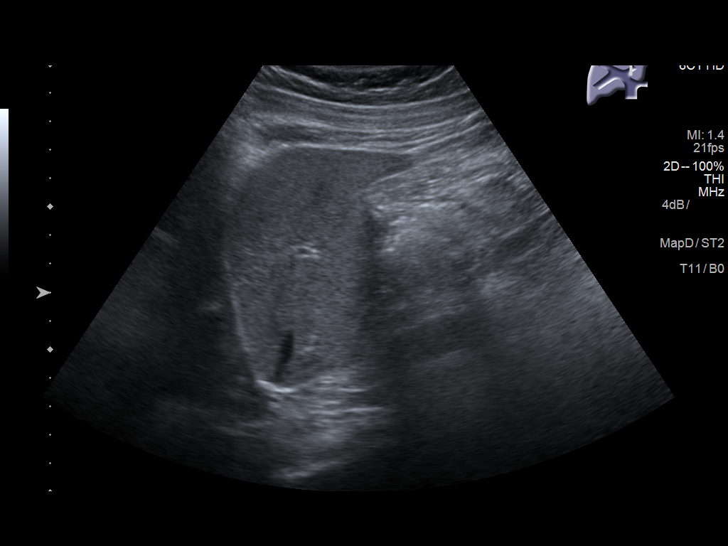
[im 29/63]
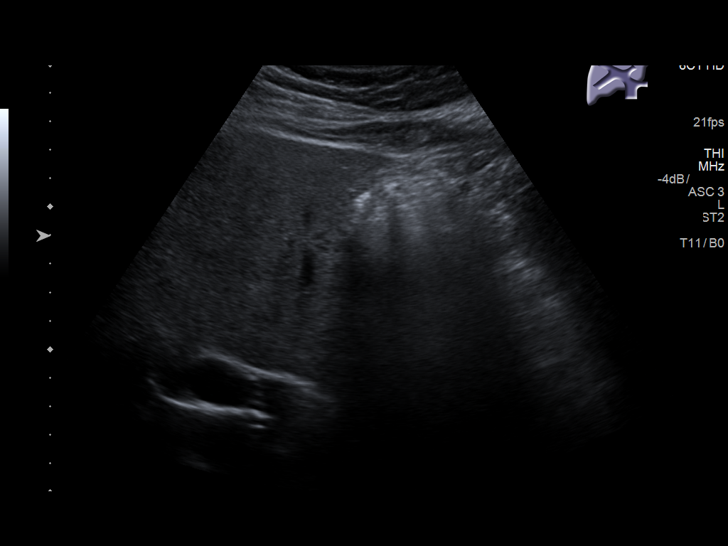
[im 34/63]
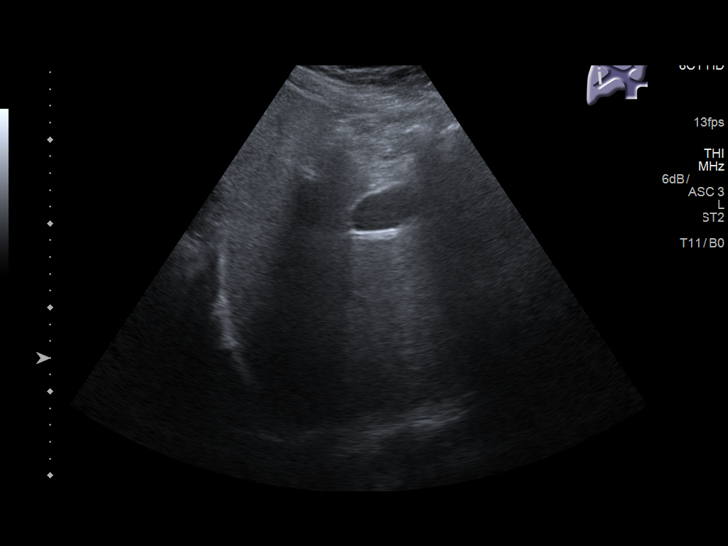
[im 39/63]
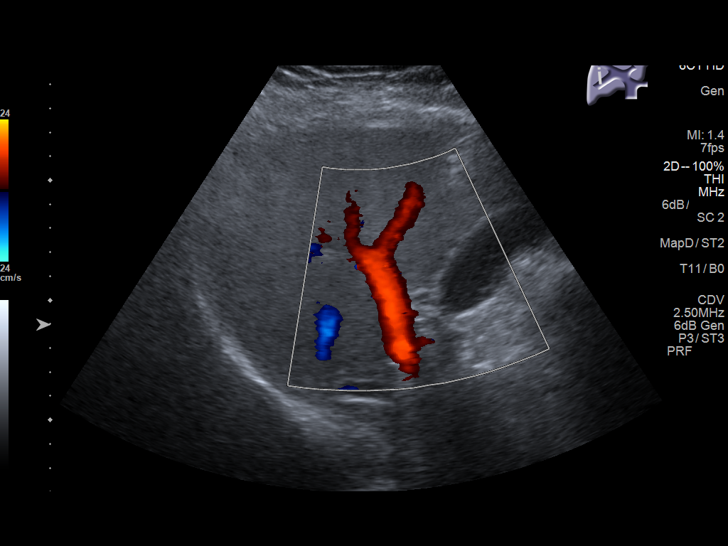
[im 42/63]
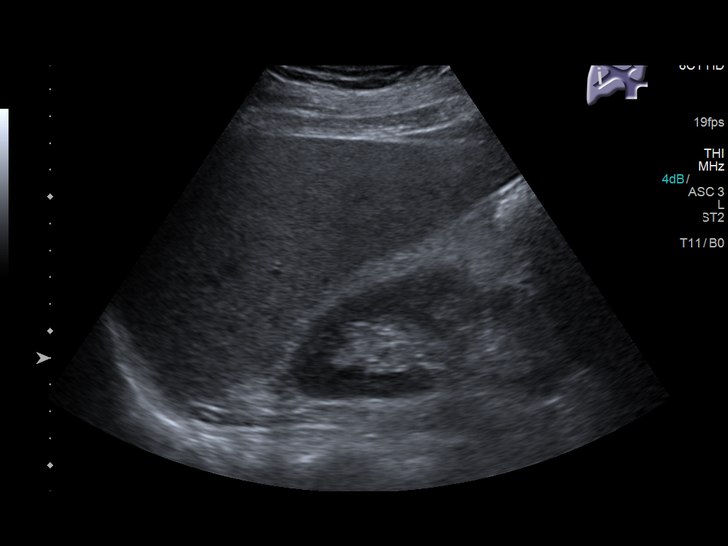
[im 47/63]
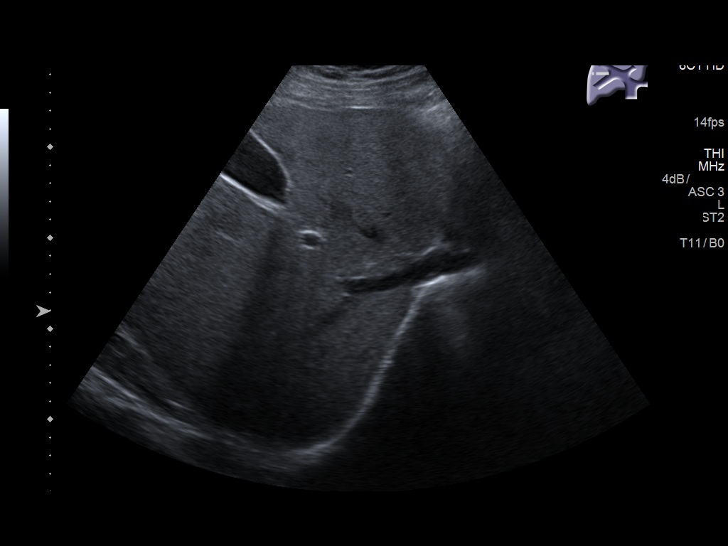
[im 52/63]
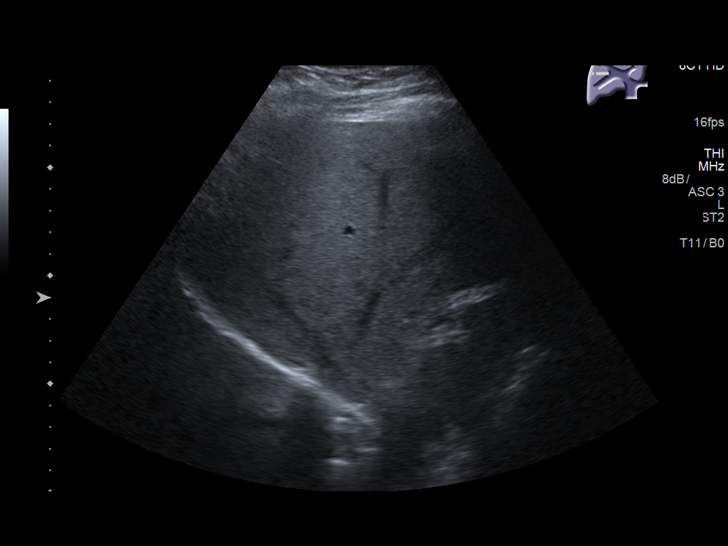
[im 57/63]
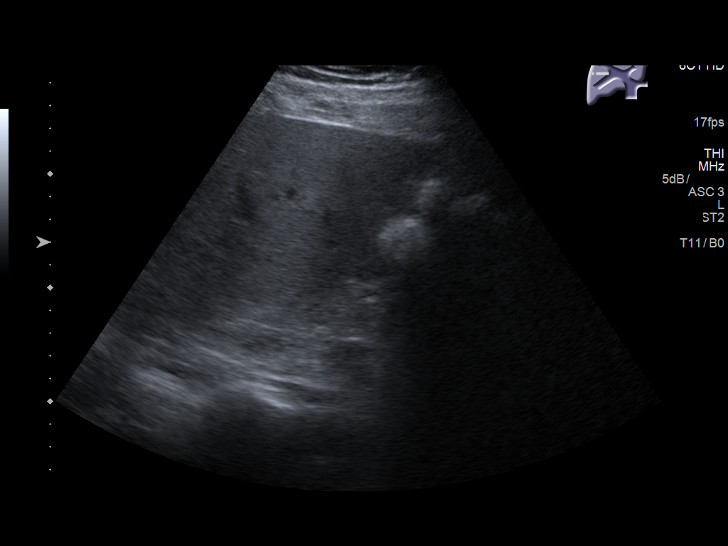
[im 63/63]
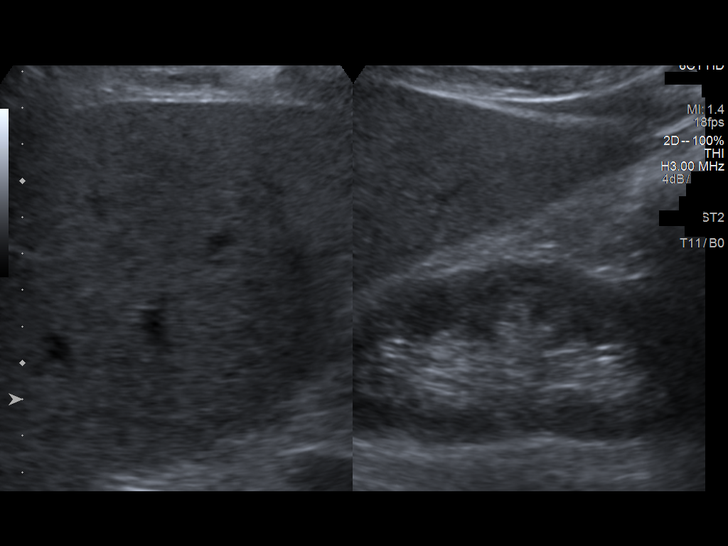

[14 of 25 positions shown; findings below may reference images not displayed]

FINDINGS: Gallbladder:

No gallstones or wall thickening visualized. No sonographic Murphy
sign noted by sonographer.

Common bile duct:

Diameter: 3 mm

Liver:

Increased echogenicity of liver parenchyma with no nodular contour
focal finding. Portal vein is patent on color Doppler imaging with
normal direction of blood flow towards the liver.
IMPRESSION: Unremarkable sonographic survey of the gallbladder.

Increased echogenicity of liver parenchyma potentially representing
steatosis.

## 2020-01-31 NOTE — Telephone Encounter (Signed)
Close encounter 

## 2020-02-29 ENCOUNTER — Telehealth: Payer: Self-pay | Admitting: Student in an Organized Health Care Education/Training Program

## 2020-02-29 NOTE — Telephone Encounter (Signed)
Patient called stating he is out of medication 02-28-20. His med mgmt appt is for 03-05-20. Is there anything that can be done so he can get a script today.

## 2020-03-04 ENCOUNTER — Other Ambulatory Visit: Payer: Self-pay

## 2020-03-04 ENCOUNTER — Encounter: Payer: Self-pay | Admitting: Student in an Organized Health Care Education/Training Program

## 2020-03-04 ENCOUNTER — Ambulatory Visit
Payer: Self-pay | Attending: Student in an Organized Health Care Education/Training Program | Admitting: Student in an Organized Health Care Education/Training Program

## 2020-03-04 DIAGNOSIS — M170 Bilateral primary osteoarthritis of knee: Secondary | ICD-10-CM

## 2020-03-04 DIAGNOSIS — M48061 Spinal stenosis, lumbar region without neurogenic claudication: Secondary | ICD-10-CM

## 2020-03-04 DIAGNOSIS — M5416 Radiculopathy, lumbar region: Secondary | ICD-10-CM

## 2020-03-04 DIAGNOSIS — G894 Chronic pain syndrome: Secondary | ICD-10-CM

## 2020-03-04 MED ORDER — OXYCODONE HCL 10 MG PO TABS: 10.0000 mg | ORAL_TABLET | Freq: Three times a day (TID) | ORAL | 0 refills | Status: AC | PRN

## 2020-03-04 MED ORDER — OXYCODONE HCL 10 MG PO TABS: 10.0000 mg | ORAL_TABLET | Freq: Three times a day (TID) | ORAL | 0 refills | Status: DC | PRN

## 2020-03-04 NOTE — Progress Notes (Signed)
Patient: Anthony Nguyen  Service Category: E/M  Provider: [name], MD  DOB: 07/05/1956  DOS: 03/04/2020  Location: Office  MRN: [id number]  Setting: Ambulatory outpatient  Referring Provider: No ref. provider found  Type: Established Patient  Specialty: Interventional Pain Management  PCP: Mikey College, NP (Inactive)  Location: Home  Delivery: TeleHealth     Virtual Encounter - Pain Management PROVIDER NOTE: Information contained herein reflects review and annotations entered in association with encounter. Interpretation of such information and data should be left to medically-trained personnel. Information provided to patient can be located elsewhere in the medical record under "Patient Instructions". Document created using STT-dictation technology, any transcriptional errors that may result from process are unintentional.    Contact & Pharmacy Preferred: [phone] Home: [phone] (home) Mobile: [phone] (mobile) E-mail: [email or web address]  [facility] - [place], [place] [address] Phone: [phone] Fax: [place], [place] [address] [place] [address] Phone: [phone] Fax: [place] [address]), [place])  [address] Phone: [phone] Fax: [phone]   Pre-screening  Anthony Nguyen offered "in-person" vs "virtual" encounter. He indicated preferring virtual for this encounter.   Reason COVID-19*  Social distancing based on CDC and AMA recommendations.   I contacted Anthony Nguyen on 03/04/2020 via video conference.      I clearly identified myself as [name], MD. I verified that I was speaking with the correct person using two identifiers (Name: Anthony Nguyen, and date of birth: 1955-12-26).  Consent I sought verbal advanced  consent from [place] for virtual visit interactions. I informed Anthony Nguyen of possible security and privacy concerns, risks, and limitations associated with providing "not-in-person" medical evaluation and management services. I also informed Anthony Nguyen of the availability of "in-person" appointments. Finally, I informed him that there would be a charge for the virtual visit and that he could be  personally, fully or partially, financially responsible for it. Anthony Nguyen expressed understanding and agreed to proceed.   Historic Elements   Anthony Nguyen is a 64 y.o. year old, male patient evaluated today after his last contact with our practice on 02/29/2020. Anthony Nguyen  has a past medical history of Allergy, Arthritis, Cancer ([place]), Chronic back pain, Current every day smoker, Diverticulosis, GERD (gastroesophageal reflux disease), H/O diverticulitis of colon, Restless leg syndrome, and Sciatic pain. 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 Anthony Nguyen has a current medication list which includes the following prescription(s): albuterol, vitamin c, vitamin d3, diclofenac sodium, uribel, midodrine, omega-3, oxycodone hcl, oxycodone hcl, [START ON 04/03/2020] oxycodone hcl, tizanidine, chantix starting month pak, vitamin b-12, zinc, and pantoprazole. He  reports that he has been smoking cigarettes. He has a 60.00 pack-year smoking history. He uses smokeless tobacco. He reports that he does not drink alcohol or use drugs. Anthony Nguyen has No Known Allergies.   HPI  Today, he is being contacted for medication management.   No change in medical history since last visit.  Patient's pain is at baseline.  Patient continues multimodal pain regimen as prescribed.  States that it provides pain relief and improvement in functional  status.  Pharmacotherapy Assessment  Analgesic: 01/29/2020  1   11/28/2019  Oxycodone Hcl 10 MG Tablet  75.00  30 Bi Lat   [id number]   Haw (1669)   0  37.50 MME  Comm Ins   [place]     Monitoring: [place] PMP: PDMP reviewed during this encounter.       Pharmacotherapy: No side-effects or adverse reactions reported. Compliance: No problems identified. Effectiveness: Clinically acceptable. Plan: Refer to "POC".  UDS:  Summary  Date Value Ref Range Status  12/28/2018 FINAL  Final    Comment:    ==================================================================== TOXASSURE SELECT 13 (MW) ==================================================================== Test                             Result       Flag       Units Drug Present and Declared for Prescription Verification   Oxycodone                      314          EXPECTED   ng/mg creat   Oxymorphone                    331          EXPECTED   ng/mg creat   Noroxycodone                   1838         EXPECTED   ng/mg creat   Noroxymorphone                 130          EXPECTED   ng/mg creat    Sources of oxycodone are scheduled prescription medications.    Oxymorphone, noroxycodone, and noroxymorphone are expected    metabolites of oxycodone. Oxymorphone is also available as a    scheduled prescription medication. ==================================================================== Test                      Result    Flag   Units      Ref Range   Creatinine              102              mg/dL      >=20 ==================================================================== Declared Medications:  The flagging and interpretation on this report are based on the  following declared medications.  Unexpected results may arise from  inaccuracies in the declared medications.  **Note: The testing scope of this panel includes these medications:  Oxycodone  **Note: The testing scope of this panel does not include following  reported medications:   Diclofenac  Lisinopril  Melatonin ==================================================================== For clinical consultation, please call [phone]. ====================================================================    Laboratory Chemistry Profile   Renal Lab Results  Component Value Date   BUN 13 04/18/2019   CREATININE 0.86 04/18/2019   GFR 68.66 04/02/2014   GFRAA >60 04/18/2019   GFRNONAA >60 04/18/2019     Hepatic Lab Results  Component Value Date   AST 13 (L) 04/18/2019   ALT 15 04/18/2019   ALBUMIN 4.6 04/18/2019   ALKPHOS 66 04/18/2019   LIPASE 44 03/22/2019     Electrolytes Lab Results  Component Value Date   NA 143 04/18/2019   K 3.7 04/18/2019   CL 108 04/18/2019   CALCIUM 9.6 04/18/2019     Bone No results found for: [place], [id number], [id number], [id number], [place], 25OHVITD2,  25OHVITD3, TESTOFREE, TESTOSTERONE   Inflammation (CRP: Acute Phase) (ESR: Chronic Phase) Lab Results  Component Value Date   ESRSEDRATE 30 (H) 09/29/2017       Note: Above Lab results reviewed.  Imaging  DG Chest 2 View CLINICAL DATA:  Productive cough x2 months as well as diaphoresis with activity. Patient states that he feels like he has phlegm in his throat and chest that he cannot cough up. Per patient request patient had 3 negative covid tests. Current smoker.  EXAM: CHEST - 2 VIEW  COMPARISON:  04/18/2019 chest x-ray and 11/07/2014 chest CT  FINDINGS: Cardiomediastinal silhouette is normal. The lungs are free of focal consolidations and pleural effusions. There is mild prominence of interstitial markings slightly increased compared to prior study. No evidence for pulmonary edema. Mild degenerative changes in the thoracic spine.  IMPRESSION: Mildly prominent interstitial markings, consistent with viral infection or inflammatory process. No consolidations.  Electronically Signed   By: Nolon Nations M.D.   On: 05/12/2019  15:16  Assessment  The primary encounter diagnosis was Chronic pain syndrome. Diagnoses of Lumbar radiculopathy, Lumbar foraminal stenosis, and Primary osteoarthritis of both knees were also pertinent to this visit.  Plan of Care   Mr. DAMONTAY ALRED has a current medication list which includes the following long-term medication(s): pantoprazole.  Pharmacotherapy (Medications Ordered): Meds ordered this encounter  Medications  . Oxycodone HCl 10 MG TABS    Sig: Take 1 tablet (10 mg total) by mouth 3 (three) times daily as needed.    Dispense:  75 tablet    Refill:  0    For chronic pain syndrome. Not to exceed 75 tablets/month  . Oxycodone HCl 10 MG TABS    Sig: Take 1 tablet (10 mg total) by mouth 3 (three) times daily as needed.    Dispense:  75 tablet    Refill:  0    For chronic pain syndrome. Not to exceed 75 tablets/month   Follow-up plan:   Return in about 8 weeks (around 04/29/2020) for Medication Management, in person, (UDS).   Recent Visits No visits were found meeting these conditions.  Showing recent visits within past 90 days and meeting all other requirements   Today's Visits Date Type Provider Dept  03/04/20 Telemedicine [name], MD [facility]  Showing today's visits and meeting all other requirements   Future Appointments No visits were found meeting these conditions.  Showing future appointments within next 90 days and meeting all other requirements   I discussed the assessment and treatment plan with the patient. The patient was provided an opportunity to ask questions and all were answered. The patient agreed with the plan and demonstrated an understanding of the instructions.  Patient advised to call back or seek an in-person evaluation if the symptoms or condition worsens.  Duration of encounter: 25 minutes.  Note by: [name], MD Date: 03/04/2020; Time: 12:13 PM

## 2020-03-05 ENCOUNTER — Telehealth: Payer: Self-pay | Admitting: Student in an Organized Health Care Education/Training Program

## 2020-03-13 ENCOUNTER — Encounter: Payer: Self-pay | Admitting: Family Medicine

## 2020-03-13 ENCOUNTER — Telehealth (INDEPENDENT_AMBULATORY_CARE_PROVIDER_SITE_OTHER): Payer: Self-pay | Admitting: Family Medicine

## 2020-03-13 ENCOUNTER — Other Ambulatory Visit: Payer: Self-pay

## 2020-03-13 DIAGNOSIS — Z5321 Procedure and treatment not carried out due to patient leaving prior to being seen by health care provider: Secondary | ICD-10-CM

## 2020-03-13 NOTE — Progress Notes (Signed)
Patient was scheduled for Virtual Visit today for acute URI sick symptoms with cough congestion. This was a same day appointment scheduled today. Previously followed by Cassell Smiles, AGPCNP-BC here. He has not met new provider Cyndia Skeeters, FNP. She is out of office today.  We have attempted to call patient twice after his initiate intake by CMA was completed. Provider has been unable to reach patient. Phone is going straight to voicemail both numbers provided.  He may be seen at an Urgent Care or other location for care as needed if his symptoms are not improving.  No medical diagnosis or treatment completed today.  Additionally - chart review shows he has established with new PCP at [facility] on 01/31/20.  Nobie Putnam, [facility] 03/13/2020, 4:42 PM

## 2020-03-14 ENCOUNTER — Telehealth: Payer: Self-pay

## 2020-03-14 NOTE — Telephone Encounter (Signed)
Copied from [place] [id number]. Topic: General - Other >> Mar 13, 2020  5:17 PM Yvette Rack wrote: Reason for CRM: Pt friend called stating they have not heard from Dr. Parks Ranger regarding the virtual appt. Pt friend stated the nurse called and told them that the doctor would be calling but they have yet to get a call and pt virtual appt was scheduled for 4 pm.

## 2020-03-14 NOTE — Telephone Encounter (Signed)
Try calling the patient back unable to reach for both provided number, left message to [facility] team to transfer his returned phone call to [facility] today on 03/14/2020, explained patient's friend Kieth Brightly that we had tried calling patient back yesterday four time twice by [facility] CMA and twice by Dr.K, his number was going straight to his voice mail and have not heard any reply back from the patient till 5. We had no apt spot left today for the patient to offer by the time we had received her phone call and advised her that patient can go to urgent care for acute sinus problem today if he is getting worst or can be seen by Dr.K tomorrow as virtual visit or can establish with Elmyra Ricks next week if he prefers but she refused all the offers. Also he was established at [facility] family pract[facility] and inform Kieth Brightly that he can't have two provider but the reply was he was seen for acute problem and states,"Don't need to worry about seeing him will find another provider."

## 2020-03-18 ENCOUNTER — Ambulatory Visit (INDEPENDENT_AMBULATORY_CARE_PROVIDER_SITE_OTHER)
Admission: RE | Admit: 2020-03-18 | Discharge: 2020-03-18 | Disposition: A | Discharge status: Home / Self Care | Payer: Self-pay | Source: Ambulatory Visit

## 2020-03-18 DIAGNOSIS — R059 Cough, unspecified: Secondary | ICD-10-CM

## 2020-03-18 DIAGNOSIS — R05 Cough: Secondary | ICD-10-CM

## 2020-03-18 NOTE — ED Provider Notes (Signed)
Virtual Visit via Video Note:  Anthony Nguyen  initiated request for Telemedicine visit with [facility] team. I connected with Anthony Nguyen  on 03/18/2020 at 2:40 PM  for a synchronized telemedicine visit using a video enabled HIPPA compliant telemedicine application. I verified that I am speaking with Anthony Nguyen  using two identifiers. Sharion Balloon, NP  was physically located in a [facility] Urgent care site and BABE KOLENOVIC was located at a different location.   The limitations of evaluation and management by telemedicine as well as the availability of in-person appointments were discussed. Patient was informed that he  may incur a bill ( including co-pay) for this virtual visit encounter. Anthony Nguyen  expressed understanding and gave verbal consent to proceed with virtual visit.     History of Present Illness:Anthony Nguyen  is a 64 y.o. male presents for evaluation of hoarse voice, "gurgling" in his lungs, cough productive yellow phlegm, fatigue, chills, fever, decreased appetite x 1.5 weeks.  He doesn't have a thermometer to take his temperature.   He had 1st COVID vaccine on 02/20/2020.  He smokes 0.5 packs per day x 30 years.  He has an appointment with his PCP tomorrow.    No Known Allergies   Past Medical History:  Diagnosis Date  . Allergy   . Arthritis    osteoarthritis  . Cancer ([place])    melanoma skin cancer face  . Chronic back pain    goes to Dr. Holley Raring at the pain clinic  . Current every day smoker   . Diverticulosis   . GERD (gastroesophageal reflux disease)   . H/O diverticulitis of colon   . Restless leg syndrome   . Sciatic pain      Social History   Tobacco Use  . Smoking status: Current Every Day Smoker    Packs/day: 2.00    Years: 30.00    Pack years: 60.00    Types: Cigarettes  . Smokeless tobacco: Current User  Substance Use Topics  . Alcohol use: No  . Drug use: No    ROS: as stated in HPI.  All other  systems reviewed and negative.      Observations/Objective: Physical Exam  VITALS: Patient denies fever. GENERAL: Alert, appears well and in no acute distress. HEENT: Atraumatic.  Voice hoarse. NECK: Normal movements of the head and neck. CARDIOPULMONARY: No increased WOB. Speaking in clear sentences. I:E ratio WNL.  MS: Moves all visible extremities without noticeable abnormality. PSYCH: Pleasant and cooperative, well-groomed. Speech normal rate and rhythm. Affect is appropriate. Insight and judgement are appropriate. Attention is focused, linear, and appropriate.  NEURO: CN grossly intact. Oriented as arrived to appointment on time with no prompting. Moves both UE equally.  SKIN: No obvious lesions, wounds, erythema, or cyanosis noted on face or hands.   Assessment and Plan:    ICD-10-CM   1. Cough  R05        Follow Up Instructions: Discussed with patient that he should come to the urgent care for in person evaluation of his cough productive of yellow phlegm and subjective fever. Patient readily agrees to this plan of care.      I discussed the assessment and treatment plan with the patient. The patient was provided an opportunity to ask questions and all were answered. The patient agreed with the plan and demonstrated an understanding of the instructions.   The patient was advised to call back or seek  an in-person evaluation if the symptoms worsen or if the condition fails to improve as anticipated.      Sharion Balloon, NP  03/18/2020 2:40 PM         Sharion Balloon, NP 03/18/20 1440

## 2020-03-18 NOTE — Discharge Instructions (Addendum)
Come to the Urgent Care to be seen in person.  °

## 2020-03-19 ENCOUNTER — Telehealth: Payer: Self-pay | Admitting: Student in an Organized Health Care Education/Training Program

## 2020-03-19 DIAGNOSIS — M48061 Spinal stenosis, lumbar region without neurogenic claudication: Secondary | ICD-10-CM

## 2020-03-19 DIAGNOSIS — M5416 Radiculopathy, lumbar region: Secondary | ICD-10-CM

## 2020-03-19 NOTE — Telephone Encounter (Signed)
Patient is having a lot of pain and wants to come in for appt., Dr. Holley Raring asks that a nurse call patient and triage him before we set up appt.

## 2020-03-19 NOTE — Progress Notes (Signed)
Patient is established with [facility], Dr. Azalia Bilis.

## 2020-03-19 NOTE — Telephone Encounter (Signed)
Could probably benefit from an epidural, previous one 12/28/18. Order placed, please call pt to schedule.  Orders Placed This Encounter  Procedures  . Lumbar Epidural Injection    Standing Status:   Future    Standing Expiration Date:   04/18/2020    Scheduling Instructions:     Procedure: Interlaminar Lumbar Epidural Steroid injection (LESI)            Laterality: Midline     Sedation: without     Timeframe: ASAA    Order Specific Question:   Where will this procedure be performed?    Answer:   [facility]

## 2020-03-19 NOTE — Telephone Encounter (Signed)
Patient called. He states that his pain is in the low back and in his anterior legs to the knee. States some numbness in legs (upper) and has a buzzing sensation like a TENS unit is on him. Denies trauma. Please advise.

## 2020-03-20 NOTE — Telephone Encounter (Signed)
Patient called and informed that Dr. Holley Raring has ordered an epidural. Told him I would have admin check his insurance and schedule him ASAP. He also states he has run out of his medication and requesting more. Informed him that could not be done at this time and that he would need to discuss this with physician at time of visit.   Admin: Please let me know when this has been scheduled. Thanks-Erek Kowal.

## 2020-03-27 ENCOUNTER — Other Ambulatory Visit: Payer: Self-pay

## 2020-03-27 ENCOUNTER — Telehealth: Payer: Self-pay | Admitting: *Deleted

## 2020-03-27 ENCOUNTER — Ambulatory Visit
Admission: RE | Admit: 2020-03-27 | Discharge: 2020-03-27 | Disposition: A | Discharge status: Home / Self Care | Payer: Self-pay | Source: Ambulatory Visit | Attending: Student in an Organized Health Care Education/Training Program | Admitting: Student in an Organized Health Care Education/Training Program

## 2020-03-27 ENCOUNTER — Ambulatory Visit (HOSPITAL_BASED_OUTPATIENT_CLINIC_OR_DEPARTMENT_OTHER): Payer: Self-pay | Admitting: Student in an Organized Health Care Education/Training Program

## 2020-03-27 ENCOUNTER — Encounter: Payer: Self-pay | Admitting: Student in an Organized Health Care Education/Training Program

## 2020-03-27 VITALS — BP 162/98 | HR 74 | Temp 98.4°F | Resp 16 | Ht 73.0 in | Wt 222.0 lb

## 2020-03-27 DIAGNOSIS — M48061 Spinal stenosis, lumbar region without neurogenic claudication: Secondary | ICD-10-CM

## 2020-03-27 DIAGNOSIS — M5416 Radiculopathy, lumbar region: Secondary | ICD-10-CM | POA: Insufficient documentation

## 2020-03-27 DIAGNOSIS — G894 Chronic pain syndrome: Secondary | ICD-10-CM | POA: Insufficient documentation

## 2020-03-27 MED ORDER — DEXAMETHASONE SODIUM PHOSPHATE 10 MG/ML IJ SOLN
10.0000 mg | Freq: Once | INTRAMUSCULAR | Status: AC
  Administered 2020-03-27: 10 mg

## 2020-03-27 MED ORDER — LIDOCAINE HCL 2 % IJ SOLN
INTRAMUSCULAR | Status: AC
  Filled 2020-03-27: qty 20

## 2020-03-27 MED ORDER — SODIUM CHLORIDE 0.9% FLUSH
2.0000 mL | Freq: Once | INTRAVENOUS | Status: AC
  Administered 2020-03-27: 2 mL

## 2020-03-27 MED ORDER — DEXAMETHASONE SODIUM PHOSPHATE 10 MG/ML IJ SOLN
INTRAMUSCULAR | Status: AC
  Filled 2020-03-27: qty 1

## 2020-03-27 MED ORDER — ROPIVACAINE HCL 2 MG/ML IJ SOLN
2.0000 mL | Freq: Once | INTRAMUSCULAR | Status: AC
  Administered 2020-03-27: 2 mL via EPIDURAL

## 2020-03-27 MED ORDER — LIDOCAINE HCL 2 % IJ SOLN
20.0000 mL | Freq: Once | INTRAMUSCULAR | Status: AC
  Administered 2020-03-27: 400 mg

## 2020-03-27 MED ORDER — SODIUM CHLORIDE (PF) 0.9 % IJ SOLN
INTRAMUSCULAR | Status: AC
  Filled 2020-03-27: qty 10

## 2020-03-27 MED ORDER — ROPIVACAINE HCL 2 MG/ML IJ SOLN
INTRAMUSCULAR | Status: AC
  Filled 2020-03-27: qty 10

## 2020-03-27 MED ORDER — IOHEXOL 180 MG/ML  SOLN
10.0000 mL | Freq: Once | INTRAMUSCULAR | Status: AC
  Administered 2020-03-27: 10 mL via EPIDURAL

## 2020-03-27 NOTE — Progress Notes (Signed)
Patient's Name: Anthony Nguyen  MRN: [id number]  Referring Provider: [name], MD  DOB: 03-30-1956  PCP: Nelle Don, MD  DOS: 03/27/2020  Note by: [name], MD  Service setting: Ambulatory outpatient  Specialty: Interventional Pain Management  Patient type: Established  Location: [facility] (AMB) [facility] Facility  Visit type: Interventional Procedure   Primary Reason for Visit: Interventional Pain Management Treatment. CC: Back Pain  Procedure:       Anesthesia, Analgesia, Anxiolysis:  Type: Therapeutic Inter-Laminar Epidural Steroid Injection #1 (in 2021, previously done March 2020)  Region: Lumbar Level: L4-5 Level. Laterality: Right-Sided         Type: Local Anesthesia Indication(s): Analgesia and Anxiety Route: Infiltration (Oracle/IM) IV Access: Declined Sedation: Declined  Local Anesthetic: Lidocaine 1%   Indications: 1. Chronic pain syndrome   2. Lumbar radiculopathy   3. Lumbar foraminal stenosis    Pain Score: Pre-procedure: 10-Worst pain ever/10 Post-procedure: 1 /10  Pre-op Assessment:  Anthony Nguyen is a 64 y.o. (year old), male patient, seen today for interventional treatment. 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 [name] has a current medication list which includes the following prescription(s): albuterol, vitamin c, vitamin d3, uribel, midodrine, omega-3, oxycodone hcl, oxycodone hcl, [START ON 04/03/2020] oxycodone hcl, probiotic product, tizanidine, vitamin b-12, zinc, diclofenac sodium, and pantoprazole. His primarily concern today is the Back Pain  Initial Vital Signs:  Pulse Rate: 85 Temp: 98.4 F (36.9 C) Resp: 16 BP: (!) 151/92 SpO2: 98 %  BMI: Estimated body mass index is 29.29 kg/m as calculated from the following:   Height as of this encounter: 6\' 1"  (1.854 m).   Weight as of this  encounter: 222 lb (100.7 kg).  Risk Assessment: Allergies: Reviewed. He has No Known Allergies.  Allergy Precautions: None required Coagulopathies: Reviewed. None identified.  Blood-thinner therapy: None at this time Active Infection(s): Reviewed. None identified. Anthony Nguyen is afebrile  Site Confirmation: Anthony Nguyen was asked to confirm the procedure and laterality before marking the site Procedure checklist: Completed Consent: Before the procedure and under the influence of no sedative(s), amnesic(s), or anxiolytics, the patient was informed of the treatment options, risks and possible complications. To fulfill our ethical and legal obligations, as recommended by the American Medical Association's Code of Ethics, I have informed the patient of my clinical impression; the nature and purpose of the treatment or procedure; the risks, benefits, and possible complications of the intervention; the alternatives, including doing nothing; the risk(s) and benefit(s) of the alternative treatment(s) or procedure(s); and the risk(s) and benefit(s) of doing nothing. The patient was provided information about the general risks and possible complications associated with the procedure. These may include, but are not limited to: failure to achieve desired goals, infection, bleeding, organ or nerve damage, allergic reactions, paralysis, and death. In addition, the patient was informed of those risks and complications associated to Spine-related procedures, such as failure to decrease pain; infection (i.e.: Meningitis, epidural or intraspinal abscess); bleeding (i.e.: epidural hematoma, subarachnoid hemorrhage, or any other type of intraspinal or peri-dural bleeding); organ or nerve damage (i.e.: Any type of peripheral nerve, nerve root, or spinal cord injury) with subsequent damage to sensory, motor, and/or autonomic systems, resulting in permanent pain, numbness, and/or weakness of one or several areas of the  body; allergic reactions; (i.e.: anaphylactic reaction); and/or death. Furthermore, the patient was informed of those risks and complications  associated with the medications. These include, but are not limited to: allergic reactions (i.e.: anaphylactic or anaphylactoid reaction(s)); adrenal axis suppression; blood sugar elevation that in diabetics may result in ketoacidosis or comma; water retention that in patients with history of congestive heart failure may result in shortness of breath, pulmonary edema, and decompensation with resultant heart failure; weight gain; swelling or edema; medication-induced neural toxicity; particulate matter embolism and blood vessel occlusion with resultant organ, and/or nervous system infarction; and/or aseptic necrosis of one or more joints. Finally, the patient was informed that Medicine is not an exact science; therefore, there is also the possibility of unforeseen or unpredictable risks and/or possible complications that may result in a catastrophic outcome. The patient indicated having understood very clearly. We have given the patient no guarantees and we have made no promises. Enough time was given to the patient to ask questions, all of which were answered to the patient's satisfaction. Anthony Nguyen has indicated that he wanted to continue with the procedure. Attestation: I, the ordering provider, attest that I have discussed with the patient the benefits, risks, side-effects, alternatives, likelihood of achieving goals, and potential problems during recovery for the procedure that I have provided informed consent. Date  Time:   Pre-Procedure Preparation:  Monitoring: As per clinic protocol. Respiration, ETCO2, SpO2, BP, heart rate and rhythm monitor placed and checked for adequate function Safety Precautions: Patient was assessed for positional comfort and pressure points before starting the procedure. Time-out: I initiated and conducted the "Time-out" before  starting the procedure, as per protocol. The patient was asked to participate by confirming the accuracy of the "Time Out" information. Verification of the correct person, site, and procedure were performed and confirmed by me, the nursing staff, and the patient. "Time-out" conducted as per Joint Commission's Universal Protocol (UP.01.01.01). Time: 0911  Description of Procedure:       Position: Prone with head of the table was raised to facilitate breathing. Target Area: The interlaminar space, initially targeting the lower laminar border of the superior vertebral body. Approach: Paramedial approach. Area Prepped: Entire Posterior Lumbar Region Prepping solution: ChloraPrep (2% chlorhexidine gluconate and 70% isopropyl alcohol) Safety Precautions: Aspiration looking for blood return was conducted prior to all injections. At no point did we inject any substances, as a needle was being advanced. No attempts were made at seeking any paresthesias. Safe injection practices and needle disposal techniques used. Medications properly checked for expiration dates. SDV (single dose vial) medications used. Description of the Procedure: Protocol guidelines were followed. The procedure needle was introduced through the skin, ipsilateral to the reported pain, and advanced to the target area. Bone was contacted and the needle walked caudad, until the lamina was cleared. The epidural space was identified using "loss-of-resistance technique" with 2-3 ml of PF-NaCl (0.9% NSS), in a 5cc LOR glass syringe. Vitals:   03/27/20 0839 03/27/20 0900 03/27/20 0910 03/27/20 0920  BP: (!) 151/92 (!) 154/100 (!) 157/99 (!) 162/98  Pulse: 85 87 76 74  Resp:  16 18 16   Temp: 98.4 F (36.9 C)     SpO2: 98% 98% 98% 99%  Weight: 222 lb (100.7 kg)     Height: 6\' 1"  (1.854 m)       Start Time: 0911 hrs. End Time:   hrs. Materials:  Needle(s) Type: Epidural needle Gauge: 25G Length: 3.5-in Medication(s): Please see orders for  medications and dosing details. Stuart solution made of 4 cc of preservative-free saline, 2 cc of 0.2% ropivacaine, 1 cc of  Decadron 10 mg/cc Imaging Guidance (Spinal):  Type of Imaging Technique: Fluoroscopy Guidance (Spinal) Indication(s): Assistance in needle guidance and placement for procedures requiring needle placement in or near specific anatomical locations not easily accessible without such assistance. Exposure Time: Please see nurses notes. Contrast: Before injecting any contrast, we confirmed that the patient did not have an allergy to iodine, shellfish, or radiological contrast. Once satisfactory needle placement was completed at the desired level, radiological contrast was injected. Contrast injected under live fluoroscopy. No contrast complications. See chart for type and volume of contrast used. Fluoroscopic Guidance: I was personally present during the use of fluoroscopy. "Tunnel Vision Technique" used to obtain the best possible view of the target area. Parallax error corrected before commencing the procedure. "Direction-depth-direction" technique used to introduce the needle under continuous pulsed fluoroscopy. Once target was reached, antero-posterior, oblique, and lateral fluoroscopic projection used confirm needle placement in all planes. Images permanently stored in EMR. Interpretation: I personally interpreted the imaging intraoperatively. Adequate needle placement confirmed in multiple planes. Appropriate spread of contrast into desired area was observed. No evidence of afferent or efferent intravascular uptake. No intrathecal or subarachnoid spread observed. Permanent images saved into the patient's record.  Antibiotic Prophylaxis:   Anti-infectives (From admission, onward)   None     Indication(s): None identified  Post-operative Assessment:  Post-procedure Vital Signs:  Pulse Rate: 74 Temp: 98.4 F (36.9 C) Resp: 16 BP: (!) 162/98 SpO2: 99 %  EBL:  None  Complications: No immediate post-treatment complications observed by team, or reported by patient.  Note: The patient tolerated the entire procedure well. A repeat set of vitals were taken after the procedure and the patient was kept under observation following institutional policy, for this type of procedure. Post-procedural neurological assessment was performed, showing return to baseline, prior to discharge. The patient was provided with post-procedure discharge instructions, including a section on how to identify potential problems. Should any problems arise concerning this procedure, the patient was given instructions to immediately contact us, at any time, without hesitation. In any case, we plan to contact the patient by telephone for a follow-up status report regarding this interventional procedure.  Comments:  No additional relevant information. 5 out of 5 strength bilateral lower extremity: Plantar flexion, dorsiflexion, knee flexion, knee extension.  Plan of Care    Imaging Orders     DG PAIN CLINIC C-ARM 1-60 MIN NO REPORT Procedure Orders    No procedure(s) ordered today    Medications ordered for procedure: Meds ordered this encounter  Medications  . iohexol (OMNIPAQUE) 180 MG/ML injection 10 mL    Must be Myelogram-compatible. If not available, you may substitute with a water-soluble, non-ionic, hypoallergenic, myelogram-compatible radiological contrast medium.  Marland Kitchen lidocaine (XYLOCAINE) 2 % (with pres) injection 400 mg  . ropivacaine (PF) 2 mg/mL (0.2%) (NAROPIN) injection 2 mL  . sodium chloride flush (NS) 0.9 % injection 2 mL  . dexamethasone (DECADRON) injection 10 mg   Orders Placed This Encounter  Procedures  . DG PAIN CLINIC C-ARM 1-60 MIN NO REPORT    Intraoperative interpretation by procedural physician at [place].    Standing Status:   Standing    Number of Occurrences:   1    Order Specific Question:   Reason for exam:    Answer:    Assistance in needle guidance and placement for procedures requiring needle placement in or near specific anatomical locations not easily accessible without such assistance.  Raelene Bott Select 13 (MW), Urine  Volume: 30 ml(s). Minimum 3 ml of urine is needed. Document temperature of fresh sample. Indications: Long term (current) use of opiate analgesic (B63.893)     Medications administered: We administered iohexol, lidocaine, ropivacaine (PF) 2 mg/mL (0.2%), sodium chloride flush, and dexamethasone.  See the medical record for exact dosing, route, and time of administration.  New Prescriptions   No medications on file   Disposition: Discharge home  Discharge Date & Time: 03/27/2020;   hrs.   Physician-requested Follow-up: Return for Keep sch. appt.  Future Appointments  Date Time Provider [place]  04/23/2020  2:00 PM [name], MD [facility] None   Primary Care Physician: Nelle Don, MD Location: [facility] Note by: [name], MD Date: 03/27/2020; Time: 9:39 AM  Disclaimer:  Medicine is not an exact science. The only guarantee in medicine is that nothing is guaranteed. It is important to note that the decision to proceed with this intervention was based on the information collected from the patient. The Data and conclusions were drawn from the patient's questionnaire, the interview, and the physical examination. Because the information was provided in large part by the patient, it cannot be guaranteed that it has not been purposely or unconsciously manipulated. Every effort has been made to obtain as much relevant data as possible for this evaluation. It is important to note that the conclusions that lead to this procedure are derived in large part from the available data. Always take into account that the treatment will also be dependent on availability of resources and existing treatment guidelines, considered by other Pain Management  Practitioners as being common knowledge and practice, at the time of the intervention. For Medico-Legal purposes, it is also important to point out that variation in procedural techniques and pharmacological choices are the acceptable norm. The indications, contraindications, technique, and results of the above procedure should only be interpreted and judged by a Board-Certified Interventional Pain Specialist with extensive familiarity and expertise in the same exact procedure and technique.

## 2020-03-27 NOTE — Progress Notes (Signed)
Safety precautions to be maintained throughout the outpatient stay will include: orient to surroundings, keep bed in low position, maintain call bell within reach at all times, provide assistance with transfer out of bed and ambulation.  

## 2020-03-27 NOTE — Patient Instructions (Signed)
____________________________________________________________________________________________  Post-Procedure Discharge Instructions  Instructions:  Apply ice:   Purpose: This will minimize any swelling and discomfort after procedure.   When: Day of procedure, as soon as you get home.  How: Fill a plastic sandwich bag with crushed ice. Cover it with a small towel and apply to injection site.  How long: (15 min on, 15 min off) Apply for 15 minutes then remove x 15 minutes.  Repeat sequence on day of procedure, until you go to bed.  Apply heat:   Purpose: To treat any soreness and discomfort from the procedure.  When: Starting the next day after the procedure.  How: Apply heat to procedure site starting the day following the procedure.  How long: May continue to repeat daily, until discomfort goes away.  Food intake: Start with clear liquids (like water) and advance to regular food, as tolerated.   Physical activities: Keep activities to a minimum for the first 8 hours after the procedure. After that, then as tolerated.  Driving: If you have received any sedation, be responsible and do not drive. You are not allowed to drive for 24 hours after having sedation.  Blood thinner: (Applies only to those taking blood thinners) You may restart your blood thinner 6 hours after your procedure.  Insulin: (Applies only to Diabetic patients taking insulin) As soon as you can eat, you may resume your normal dosing schedule.  Infection prevention: Keep procedure site clean and dry. Shower daily and clean area with soap and water.  Post-procedure Pain Diary: Extremely important that this be done correctly and accurately. Recorded information will be used to determine the next step in treatment. For the purpose of accuracy, follow these rules:  Evaluate only the area treated. Do not report or include pain from an untreated area. For the purpose of this evaluation, ignore all other areas of pain,  except for the treated area.  After your procedure, avoid taking a long nap and attempting to complete the pain diary after you wake up. Instead, set your alarm clock to go off every hour, on the hour, for the initial 8 hours after the procedure. Document the duration of the numbing medicine, and the relief you are getting from it.  Do not go to sleep and attempt to complete it later. It will not be accurate. If you received sedation, it is likely that you were given a medication that may cause amnesia. Because of this, completing the diary at a later time may cause the information to be inaccurate. This information is needed to plan your care.  Follow-up appointment: Keep your post-procedure follow-up evaluation appointment after the procedure (usually 2 weeks for most procedures, 6 weeks for radiofrequencies). DO NOT FORGET to bring you pain diary with you.   Expect: (What should I expect to see with my procedure?)  From numbing medicine (AKA: Local Anesthetics): Numbness or decrease in pain. You may also experience some weakness, which if present, could last for the duration of the local anesthetic.  Onset: Full effect within 15 minutes of injected.  Duration: It will depend on the type of local anesthetic used. On the average, 1 to 8 hours.   From steroids (Applies only if steroids were used): Decrease in swelling or inflammation. Once inflammation is improved, relief of the pain will follow.  Onset of benefits: Depends on the amount of swelling present. The more swelling, the longer it will take for the benefits to be seen. In some cases, up to 10 days.    Duration: Steroids will stay in the system x 2 weeks. Duration of benefits will depend on multiple posibilities including persistent irritating factors.  Side-effects: If present, they may typically last 2 weeks (the duration of the steroids).  Frequent: Cramps (if they occur, drink Gatorade and take over-the-counter Magnesium 450-500 mg  once to twice a day); water retention with temporary weight gain; increases in blood sugar; decreased immune system response; increased appetite.  Occasional: Facial flushing (red, warm cheeks); mood swings; menstrual changes.  Uncommon: Long-term decrease or suppression of natural hormones; bone thinning. (These are more common with higher doses or more frequent use. This is why we prefer that our patients avoid having any injection therapies in other practices.)   Very Rare: Severe mood changes; psychosis; aseptic necrosis.  From procedure: Some discomfort is to be expected once the numbing medicine wears off. This should be minimal if ice and heat are applied as instructed.  Call if: (When should I call?)  You experience numbness and weakness that gets worse with time, as opposed to wearing off.  New onset bowel or bladder incontinence. (Applies only to procedures done in the spine)  Emergency Numbers:  Durning business hours (Monday - Thursday, 8:00 AM - 4:00 PM) (Friday, 9:00 AM - 12:00 Noon): [phone]  After hours: [phone]  NOTE: If you are having a problem and are unable connect with, or to talk to a provider, then go to your nearest urgent care or emergency department. If the problem is serious and urgent, please call 911. ____________________________________________________________________________________________   Epidural Steroid Injection  An epidural steroid injection is a shot of steroid medicine and numbing medicine that is given into the space between the spinal cord and the bones of the back (epidural space). The shot helps relieve pain caused by an irritated or swollen nerve root. The amount of pain relief you get from the injection depends on what is causing the nerve to be swollen and irritated, and how long your pain lasts. You are more likely to benefit from this injection if your pain is strong and comes on suddenly rather than if you have had  long-term (chronic) pain. Tell a health care provider about:  Any allergies you have.  All medicines you are taking, including vitamins, herbs, eye drops, creams, and over-the-counter medicines.  Any problems you or family members have had with anesthetic medicines.  Any blood disorders you have.  Any surgeries you have had.  Any medical conditions you have.  Whether you are pregnant or may be pregnant. What are the risks? Generally, this is a safe procedure. However, problems may occur, including:  Headache.  Bleeding.  Infection.  Allergic reaction to medicines.  Nerve damage. What happens before the procedure? Staying hydrated Follow instructions from your health care provider about hydration, which may include:  Up to 2 hours before the procedure - you may continue to drink clear liquids, such as water, clear fruit juice, black coffee, and plain tea. Eating and drinking restrictions Follow instructions from your health care provider about eating and drinking, which may include:  8 hours before the procedure - stop eating heavy meals or foods, such as meat, fried foods, or fatty foods.  6 hours before the procedure - stop eating light meals or foods, such as toast or cereal.  6 hours before the procedure - stop drinking milk or drinks that contain milk.  2 hours before the procedure - stop drinking clear liquids. Medicines  You may be given   medicines to lower anxiety.  Ask your health care provider about: ? Changing or stopping your regular medicines. This is especially important if you are taking diabetes medicines or blood thinners. ? Taking medicines such as aspirin and ibuprofen. These medicines can thin your blood. Do not take these medicines unless your health care provider tells you to take them. ? Taking over-the-counter medicines, vitamins, herbs, and supplements.  Ask your health care provider what steps will be taken to prevent infection. General  instructions  Plan to have someone take you home from the hospital or clinic.  If you will be going home right after the procedure, plan to have someone with you for 24 hours. What happens during the procedure?  An IV will be inserted into one of your veins.  You will be given one or more of the following: ? A medicine to help you relax (sedative). ? A medicine to numb the area (local anesthetic).  You will be asked to lie on your abdomen or sit.  The injection site will be cleaned.  A needle will be inserted through your skin into the epidural space. This may cause you some discomfort. An X-ray machine will be used to guide the needle as close as possible to the affected nerve.  A steroid medicine and a local anesthetic will be injected into the epidural space.  The needle and IV will be removed.  A bandage (dressing) will be put over the injection site. The procedure may vary among health care providers and hospitals. What can I expect after the procedure? Follow these instructions at home: Injection site care  You may remove the bandage (dressing) after 24 hours.  Check your injection site every day for signs of infection. Check for: ? Redness, swelling, or pain. ? Fluid or blood. ? Warmth. ? Pus or a bad smell. Managing pain, stiffness, and swelling  For 24 hours after the procedure: ? Avoid using heat on the injection site. ? Do not take baths, swim, or use a hot tub until your health care provider approves. Ask your health care provider if you may take a shower. You may only be allowed to take sponge baths.  If directed, put ice on the injection site. To do this: ? Put ice in a plastic bag. ? Place a towel between your skin and the bag. ? Leave the ice on for 20 minutes, 2-3 times a day.  Activity  Do not drive for 24 hours if you were given a sedative during your procedure.  Return to your normal activities as told by your health care provider. Ask your  health care provider what activities are safe for you. General instructions  Your blood pressure, heart rate, breathing rate, and blood oxygen level will be monitored until you leave the hospital or clinic.  Your arm or leg may feel weak or numb for a few hours.  The injection site may feel sore.  Take over-the-counter and prescription medicines only as told by your health care provider.  Drink enough fluid to keep your urine pale yellow.  Keep all follow-up visits as told by your health care provider. This is important. Contact a health care provider if:  You have any of these signs of infection: ? Redness, swelling, or pain around your injection site. ? Fluid or blood coming from your injection site. ? Warmth coming from your injection site. ? Pus or a bad smell coming from your injection site. ? A fever.  You continue to   have pain and soreness around the injection site, even after taking over-the-counter pain medicine.  You have severe, sudden, or lasting nausea or vomiting. Get help right away if:  You have severe pain at the injection site that is not relieved by medicines.  You develop a severe headache or a stiff neck.  You become sensitive to light.  You have any new numbness or weakness in your legs or arms.  You lose control of your bladder or bowel movements.  You have trouble breathing. Summary  An epidural steroid injection is a shot of steroid medicine and numbing medicine that is given into the epidural space.  The shot helps relieve pain caused by an irritated or swollen nerve root.  You are more likely to benefit from this injection if your pain is strong and comes on suddenly rather than if you have had chronic pain. This information is not intended to replace advice given to you by your health care provider. Make sure you discuss any questions you have with your health care provider. Document Revised: 04/17/2019 Document Reviewed: 04/17/2019 Elsevier  Patient Education  2020 [employer]  

## 2020-03-28 ENCOUNTER — Telehealth: Payer: Self-pay | Admitting: *Deleted

## 2020-03-28 MED ORDER — OXYCODONE HCL 10 MG PO TABS: 10.0000 mg | ORAL_TABLET | Freq: Two times a day (BID) | ORAL | 0 refills | Status: AC | PRN

## 2020-03-28 NOTE — Telephone Encounter (Signed)
Requested Prescriptions   Signed Prescriptions Disp Refills  . Oxycodone HCl 10 MG TABS 10 tablet 0    Sig: Take 1 tablet (10 mg total) by mouth 2 (two) times daily as needed for up to 5 days.    Authorizing Provider: [name]

## 2020-03-28 NOTE — Telephone Encounter (Signed)
I called the pharmacy, he last filled Oxycodone on 03-04-20.  I called Mr. Finchum, he has been taking 3/day, and has run out early. He said you were aware of this. He is asking for 10 additional tabs, that will last until his next fill date of 04-03-20.

## 2020-03-28 NOTE — Telephone Encounter (Signed)
Spoke with pharmacist Joseph Art, informed her ok to fill script for Oxycodone written today, #10 tabs.

## 2020-03-28 NOTE — Telephone Encounter (Signed)
Attempted to call for post procedure follow-up. Message left. 

## 2020-03-28 NOTE — Telephone Encounter (Signed)
Pt.notified

## 2020-04-02 LAB — TOXASSURE SELECT 13 (MW), URINE

## 2020-04-03 ENCOUNTER — Other Ambulatory Visit: Payer: Self-pay | Admitting: Family Medicine

## 2020-04-03 DIAGNOSIS — G5631 Lesion of radial nerve, right upper limb: Secondary | ICD-10-CM

## 2020-04-03 NOTE — Telephone Encounter (Signed)
Requested medication (s) are due for refill today: yes  Requested medication (s) are on the active medication list: yes  Last refill: 12/14/2019  #45   0 refills  Future visit scheduled no  Notes to clinic Not delegated  Requested Prescriptions  Pending Prescriptions Disp Refills   tiZANidine (ZANAFLEX) 4 MG tablet [Pharmacy Med Name: tiZANidine HCl 4 MG Oral Tablet] 270 tablet 0    Sig: Take 1 tablet by mouth three times daily as needed for muscle spasm      Not Delegated - Cardiovascular:  Alpha-2 Agonists - tizanidine Failed - 04/03/2020  6:24 PM      Failed - This refill cannot be delegated      Passed - Valid encounter within last 6 months    Recent Outpatient Visits           3 weeks ago Patient left without being seen   [place], DO   8 months ago Postural dizziness   [facility] Merrilyn Puma, Jerrel Ivory, NP   10 months ago Cough   [facility] Mikey College, NP   11 months ago Acute bronchitis with COPD [facility])   [place], DO   1 year ago Dyspnea on exertion   [place], Devonne Doughty, DO

## 2020-04-04 NOTE — Telephone Encounter (Signed)
No longer our patient. He has self discharged and re-established with [place] [facility] Emelia Salisbury  Nobie Putnam, [facility] 04/04/2020, 9:14 AM

## 2020-04-18 ENCOUNTER — Ambulatory Visit
Payer: Self-pay | Attending: Student in an Organized Health Care Education/Training Program | Admitting: Student in an Organized Health Care Education/Training Program

## 2020-04-18 ENCOUNTER — Encounter: Payer: Self-pay | Admitting: Student in an Organized Health Care Education/Training Program

## 2020-04-18 ENCOUNTER — Other Ambulatory Visit: Payer: Self-pay

## 2020-04-18 VITALS — BP 152/91 | HR 96 | Temp 99.0°F | Resp 16 | Ht 73.0 in | Wt 214.0 lb

## 2020-04-18 DIAGNOSIS — G894 Chronic pain syndrome: Secondary | ICD-10-CM | POA: Insufficient documentation

## 2020-04-18 DIAGNOSIS — M5416 Radiculopathy, lumbar region: Secondary | ICD-10-CM | POA: Insufficient documentation

## 2020-04-18 DIAGNOSIS — M48061 Spinal stenosis, lumbar region without neurogenic claudication: Secondary | ICD-10-CM | POA: Insufficient documentation

## 2020-04-18 MED ORDER — OXYCODONE HCL 10 MG PO TABS: 10.0000 mg | ORAL_TABLET | Freq: Three times a day (TID) | ORAL | 0 refills | Status: AC | PRN

## 2020-04-18 MED ORDER — OXYCODONE HCL 10 MG PO TABS: 10.0000 mg | ORAL_TABLET | Freq: Three times a day (TID) | ORAL | 0 refills | Status: DC | PRN

## 2020-04-18 NOTE — Progress Notes (Signed)
PROVIDER NOTE: Information contained herein reflects review and annotations entered in association with encounter. Interpretation of such information and data should be left to medically-trained personnel. Information provided to patient can be located elsewhere in the medical record under "Patient Instructions". Document created using STT-dictation technology, any transcriptional errors that may result from process are unintentional.    Patient: Anthony Nguyen  Service Category: E/M  Provider: [name], MD  DOB: 11/14/1955  DOS: 04/18/2020  Specialty: Interventional Pain Management  MRN: [id number]  Setting: Ambulatory outpatient  PCP: Nelle Don, MD  Type: Established Patient    Referring Provider: Nelle Don, MD  Location: Office  Delivery: Face-to-face     HPI  Reason for encounter: [name], a 64 y.o. year old male, is here today for evaluation and management of his Lumbar radiculopathy [M54.16]. [name]'s primary complain today is Back Pain Last encounter: Practice (03/28/2020). My last encounter with him was on 03/27/2020. Pertinent problems: Anthony Nguyen has Primary osteoarthritis of left knee; Lumbar radiculopathy; Lumbar degenerative disc disease; Lumbar foraminal stenosis; History of left knee replacement; Primary osteoarthritis of both knees; and Chronic pain syndrome on their pertinent problem list. Pain Assessment: Severity of Chronic pain is reported as a 8 /10. Location: Back Lower/groin, both legs with the right getting worse.. Onset: More than a month ago. Quality: Contraction, Pressure. Timing: Constant. Modifying factor(s): His procedure helped. Vitals:  height is _0  (1.854 m) and weight is 214 lb (97.1 kg). His temporal temperature is 99 F (37.2 C). His blood pressure is 152/91 (abnormal) and his pulse is 96. His respiration is 16 and oxygen saturation is 99%.    Post-Procedure Evaluation  Procedure:  Type: Therapeutic Inter-Laminar  Epidural Steroid Injection #1 (in 2021, previously done March 2020)  Region: Lumbar Level: L4-5 Level. Laterality: Right-Sided         Sedation: Please see nurses note.  Effectiveness during initial hour after procedure(Ultra-Short Term Relief): 0 %   Local anesthetic used: Long-acting (4-6 hours) Effectiveness: Defined as any analgesic benefit obtained secondary to the administration of local anesthetics. This carries significant diagnostic value as to the etiological location, or anatomical origin, of the pain. Duration of benefit is expected to coincide with the duration of the local anesthetic used.  Effectiveness during initial 4-6 hours after procedure(Short-Term Relief): 0 %   Long-term benefit: Defined as any relief past the pharmacologic duration of the local anesthetics.  Effectiveness past the initial 6 hours after procedure(Long-Term Relief): 100 %   Current benefits: Defined as benefit that persist at this time.   Analgesia:  <50% better Function: Back to baseline ROM: Back to baseline       Pharmacotherapy Assessment   04/03/2020  1   03/04/2020  Oxycodone Hcl 10 MG Tablet  75.00  25 Bi Lat   [id number]   [place] (1669)   0  45.00 MME  Comm Ins   [place]      Monitoring: [place] PMP: PDMP reviewed during this encounter.       Pharmacotherapy: No side-effects or adverse reactions reported. Compliance: No problems identified. Effectiveness: Clinically acceptable.  UDS:  Summary  Date Value Ref Range Status  03/27/2020 Note  Final    Comment:    ==================================================================== [employer] Select 13 (MW) ==================================================================== Test                             Result  Flag       Units  Drug Present and Declared for Prescription Verification   Oxycodone                      1653         EXPECTED   ng/mg creat   Oxymorphone                    512          EXPECTED   ng/mg creat   Noroxycodone                    >3876        EXPECTED   ng/mg creat   Noroxymorphone                 387          EXPECTED   ng/mg creat    Sources of oxycodone are scheduled prescription medications.    Oxymorphone, noroxycodone, and noroxymorphone are expected    metabolites of oxycodone. Oxymorphone is also available as a    scheduled prescription medication.  ==================================================================== Test                      Result    Flag   Units      Ref Range   Creatinine              258              mg/dL      >=20 ==================================================================== Declared Medications:  The flagging and interpretation on this report are based on the  following declared medications.  Unexpected results may arise from  inaccuracies in the declared medications.   **Note: The testing scope of this panel includes these medications:   Oxycodone   **Note: The testing scope of this panel does not include the  following reported medications:   Albuterol (Ventolin HFA)  Cyanocobalamin  Hyoscyamine (Uribel)  Methenamine (Uribel)  Methylene Blue (Uribel)  Midodrine (Proamatine)  Omega-3 Fatty Acids  Phenyl salicylate (Uribel)  Probiotic  Sodium phosphate, monobasic (Uribel)  Tizanidine (Zanaflex)  Topical Diclofenac  Vitamin C  Vitamin D3  Zinc ==================================================================== For clinical consultation, please call [phone]. ====================================================================       ROS  Constitutional: Denies any fever or chills Gastrointestinal: No reported hemesis, hematochezia, vomiting, or acute GI distress Musculoskeletal: Denies any acute onset joint swelling, redness, loss of ROM, or weakness Neurological: No reported episodes of acute onset apraxia, aphasia, dysarthria, agnosia, amnesia, paralysis, loss of coordination, or loss of consciousness  Medication Review   DULoxetine, Diclofenac Sodium, Omega-3, Oxycodone HCl, Probiotic Product, Uribel, Vitamin D3, Zinc, albuterol, midodrine, nicotine, pantoprazole, silodosin, tiZANidine, vitamin B-12, and vitamin C  History Review  Allergy: Anthony Nguyen has No Known Allergies. Drug: Anthony Nguyen  reports no history of drug use. Alcohol:  reports no history of alcohol use. Tobacco:  reports that he has been smoking cigarettes. He has a 60.00 pack-year smoking history. He uses smokeless tobacco. Social: Anthony Nguyen  reports that he has been smoking cigarettes. He has a 60.00 pack-year smoking history. He uses smokeless tobacco. He reports that he does not drink alcohol and does not use drugs. Medical:  has a past medical history of Allergy, Arthritis, Cancer ([place]), Chronic back pain, Current every day smoker, Diverticulosis, GERD (gastroesophageal reflux disease), H/O diverticulitis of colon, Restless leg syndrome, and Sciatic pain. Surgical: Anthony Nguyen  has a past surgical history that includes Tonsillectomy and adenoidectomy; Total knee arthroplasty (Left, 10/14/2017); skin cancer removal; and Joint replacement (Left, 10/14/2018). Family: family history includes Alzheimer's disease in his father; Cancer (age of onset: 43) in his sister; Diabetes in his sister; Healthy in his brother, son, son, son, and son; Heart disease in his brother; Hypertension in his mother; Prostate cancer (age of onset: 4) in his father.  Laboratory Chemistry Profile   Renal Lab Results  Component Value Date   BUN 13 04/18/2019   CREATININE 0.86 04/18/2019   GFR 68.66 04/02/2014   GFRAA >60 04/18/2019   GFRNONAA >60 04/18/2019     Hepatic Lab Results  Component Value Date   AST 13 (L) 04/18/2019   ALT 15 04/18/2019   ALBUMIN 4.6 04/18/2019   ALKPHOS 66 04/18/2019   LIPASE 44 03/22/2019     Electrolytes Lab Results  Component Value Date   NA 143 04/18/2019   K 3.7 04/18/2019   CL 108 04/18/2019   CALCIUM 9.6  04/18/2019     Bone No results found for: VD25OH, VD125OH2TOT, JT7017BL3, JQ3009QZ3, 25OHVITD1, 25OHVITD2, 25OHVITD3, TESTOFREE, TESTOSTERONE   Inflammation (CRP: Acute Phase) (ESR: Chronic Phase) Lab Results  Component Value Date   ESRSEDRATE 30 (H) 09/29/2017       Note: Above Lab results reviewed.  Recent Imaging Review  DG PAIN CLINIC C-ARM 1-60 MIN NO REPORT Fluoro was used, but no Radiologist interpretation will be provided.  Please refer to "NOTES" tab for provider progress note. Note: Reviewed        Physical Exam  General appearance: Well nourished, well developed, and well hydrated. In no apparent acute distress Mental status: Alert, oriented x 3 (person, place, & time)       Respiratory: No evidence of acute respiratory distress Eyes: PERLA Vitals: BP (!) 152/91 (BP Location: Right Arm, Patient Position: Sitting, Cuff Size: Normal)   Pulse 96   Temp 99 F (37.2 C) (Temporal)   Resp 16   Ht _0  (1.854 m)   Wt 214 lb (97.1 kg)   SpO2 99%   BMI 28.23 kg/m  BMI: Estimated body mass index is 28.23 kg/m as calculated from the following:   Height as of this encounter: _1  (1.854 m).   Weight as of this encounter: 214 lb (97.1 kg). Ideal: Ideal body weight: 79.9 kg (176 lb 2.4 oz) Adjusted ideal body weight: 86.8 kg (191 lb 4.6 oz)  Lumbar Spine Area Exam  Skin & Axial Inspection: No masses, redness, or swelling Alignment: Symmetrical Functional ROM: Unrestricted ROM       Stability: No instability detected Muscle Tone/Strength: Functionally intact. No obvious neuro-muscular anomalies detected. Sensory (Neurological): Dermatomal pain pattern RIGHT L4/5 Palpation: No palpable anomalies       Provocative Tests: Hyperextension/rotation test: deferred today       Lumbar quadrant test (Kemp's test): (+) on the right for foraminal stenosis Lateral bending test: deferred today       Patrick's Maneuver: deferred today                   FABER* test: deferred today                    S-I anterior distraction/compression test: deferred today         S-I lateral compression test: deferred today         S-I Thigh-thrust test: deferred today         S-I  Gaenslen's test: deferred today         *(Flexion, ABduction and External Rotation) Gait & Posture Assessment  Ambulation: Unassisted Gait: Relatively normal for age and body habitus Posture: WNL  Lower Extremity Exam    Side: Right lower extremity  Side: Left lower extremity  Stability: No instability observed          Stability: No instability observed          Skin & Extremity Inspection: Skin color, temperature, and hair growth are WNL. No peripheral edema or cyanosis. No masses, redness, swelling, asymmetry, or associated skin lesions. No contractures.  Skin & Extremity Inspection: Skin color, temperature, and hair growth are WNL. No peripheral edema or cyanosis. No masses, redness, swelling, asymmetry, or associated skin lesions. No contractures.  Functional ROM: Unrestricted ROM                  Functional ROM: Unrestricted ROM                  Muscle Tone/Strength: Functionally intact. No obvious neuro-muscular anomalies detected.  Muscle Tone/Strength: Functionally intact. No obvious neuro-muscular anomalies detected.  Sensory (Neurological): Dermatomal pain pattern        Sensory (Neurological): Unimpaired        DTR: Patellar: deferred today Achilles: deferred today Plantar: deferred today  DTR: Patellar: deferred today Achilles: deferred today Plantar: deferred today  Palpation: No palpable anomalies  Palpation: No palpable anomalies     Assessment   Status Diagnosis  Responding Responding Controlled 1. Lumbar radiculopathy   2. Lumbar foraminal stenosis   3. Chronic pain syndrome      Updated Problems: Problem  Lumbar Radiculopathy  Lumbar Degenerative Disc Disease  Lumbar Foraminal Stenosis  History of Left Knee Replacement  Primary Osteoarthritis of Both Knees  Chronic  Pain Syndrome  Primary Osteoarthritis of Left Knee    Plan of Care  Anthony Nguyen has a current medication list which includes the following long-term medication(s): duloxetine and pantoprazole.  Pharmacotherapy (Medications Ordered): Meds ordered this encounter  Medications  . Oxycodone HCl 10 MG TABS    Sig: Take 1 tablet (10 mg total) by mouth 3 (three) times daily as needed.    Dispense:  75 tablet    Refill:  0    For chronic pain syndrome. Not to exceed 75 tablets/month  . Oxycodone HCl 10 MG TABS    Sig: Take 1 tablet (10 mg total) by mouth 3 (three) times daily as needed.    Dispense:  75 tablet    Refill:  0    For chronic pain syndrome. Not to exceed 75 tablets/month  . Oxycodone HCl 10 MG TABS    Sig: Take 1 tablet (10 mg total) by mouth 3 (three) times daily as needed.    Dispense:  75 tablet    Refill:  0    For chronic pain syndrome. Not to exceed 75 tablets/month   Orders:  Orders Placed This Encounter  Procedures  . Lumbar Epidural Injection    Standing Status:   Future    Standing Expiration Date:   05/19/2020    Scheduling Instructions:     Procedure: Interlaminar Lumbar Epidural Steroid injection (LESI)       RIGHT L4/5 ESI #2     Laterality: Midline     Sedation: without     Timeframe: ASAA    Order Specific Question:   Where will this procedure be performed?    Answer:  [facility]   Follow-up plan:   Return in about 3 weeks (around 05/09/2020) for L4/5 ESI #2, without sedation.   Recent Visits Date Type Provider Dept  03/27/20 Procedure visit [name], MD [facility]  03/04/20 Telemedicine [name], MD [facility]  Showing recent visits within past 90 days and meeting all other requirements Today's Visits Date Type Provider Dept  04/18/20 Office Visit [name], MD [facility]  Showing today's visits and meeting all other requirements Future Appointments Date Type Provider Dept   05/06/20 Appointment [name], MD [facility]  Showing future appointments within next 90 days and meeting all other requirements  I discussed the assessment and treatment plan with the patient. The patient was provided an opportunity to ask questions and all were answered. The patient agreed with the plan and demonstrated an understanding of the instructions.  Patient advised to call back or seek an in-person evaluation if the symptoms or condition worsens.  Duration of encounter: 30 minutes.  Note by: [name], MD Date: 04/18/2020; Time: 3:18 PM

## 2020-04-18 NOTE — Progress Notes (Signed)
Safety precautions to be maintained throughout the outpatient stay will include: orient to surroundings, keep bed in low position, maintain call bell within reach at all times, provide assistance with transfer out of bed and ambulation.  

## 2020-04-18 NOTE — Patient Instructions (Signed)
____________________________________________________________________________________________  Preparing for your procedure (without sedation)  Procedure appointments are limited to planned procedures: . No Prescription Refills. . No disability issues will be discussed. . No medication changes will be discussed.  Instructions: . Oral Intake: Do not eat or drink anything for at least 6 hours prior to your procedure. (Exception: Blood Pressure Medication. See below.) . Transportation: Unless otherwise stated by your physician, you may drive yourself after the procedure. . Blood Pressure Medicine: Do not forget to take your blood pressure medicine with a sip of water the morning of the procedure. If your Diastolic (lower reading)is above 100 mmHg, elective cases will be cancelled/rescheduled. . Blood thinners: These will need to be stopped for procedures. Notify our staff if you are taking any blood thinners. Depending on which one you take, there will be specific instructions on how and when to stop it. . Diabetics on insulin: Notify the staff so that you can be scheduled 1st case in the morning. If your diabetes requires high dose insulin, take only  of your normal insulin dose the morning of the procedure and notify the staff that you have done so. . Preventing infections: Shower with an antibacterial soap the morning of your procedure.  . Build-up your immune system: Take 1000 mg of Vitamin C with every meal (3 times a day) the day prior to your procedure. . Antibiotics: Inform the staff if you have a condition or reason that requires you to take antibiotics before dental procedures. . Pregnancy: If you are pregnant, call and cancel the procedure. . Sickness: If you have a cold, fever, or any active infections, call and cancel the procedure. . Arrival: You must be in the facility at least 30 minutes prior to your scheduled procedure. . Children: Do not bring any children with you. . Dress  appropriately: Bring dark clothing that you would not mind if they get stained. . Valuables: Do not bring any jewelry or valuables.  Reasons to call and reschedule or cancel your procedure: (Following these recommendations will minimize the risk of a serious complication.) . Surgeries: Avoid having procedures within 2 weeks of any surgery. (Avoid for 2 weeks before or after any surgery). . Flu Shots: Avoid having procedures within 2 weeks of a flu shots or . (Avoid for 2 weeks before or after immunizations). . Barium: Avoid having a procedure within 7-10 days after having had a radiological study involving the use of radiological contrast. (Myelograms, Barium swallow or enema study). . Heart attacks: Avoid any elective procedures or surgeries for the initial 6 months after a "Myocardial Infarction" (Heart Attack). . Blood thinners: It is imperative that you stop these medications before procedures. Let us know if you if you take any blood thinner.  . Infection: Avoid procedures during or within two weeks of an infection (including chest colds or gastrointestinal problems). Symptoms associated with infections include: Localized redness, fever, chills, night sweats or profuse sweating, burning sensation when voiding, cough, congestion, stuffiness, runny nose, sore throat, diarrhea, nausea, vomiting, cold or Flu symptoms, recent or current infections. It is specially important if the infection is over the area that we intend to treat. . Heart and lung problems: Symptoms that may suggest an active cardiopulmonary problem include: cough, chest pain, breathing difficulties or shortness of breath, dizziness, ankle swelling, uncontrolled high or unusually low blood pressure, and/or palpitations. If you are experiencing any of these symptoms, cancel your procedure and contact your primary care physician for an evaluation.  Remember:  Regular   Business hours are:  Monday to Thursday 8:00 AM to 4:00  PM  Provider's Schedule: Milinda Pointer, MD:  Procedure days: Tuesday and Thursday 7:30 AM to 4:00 PM  [name], MD:  Procedure days: Monday and Wednesday 7:30 AM to 4:00 PM ____________________________________________________________________________________________ Three prescriptions for Oxycodone have been sent to your pharmacy.

## 2020-04-23 ENCOUNTER — Telehealth: Payer: Self-pay | Admitting: Student in an Organized Health Care Education/Training Program

## 2020-04-23 ENCOUNTER — Encounter: Payer: Self-pay | Admitting: Student in an Organized Health Care Education/Training Program

## 2020-04-23 NOTE — Telephone Encounter (Signed)
Pt called and states that he is out of pain meds and is requesting Dr Holley Raring send more in to get him to his next refill. He states he is taking 3 a day instead of 2 a day as prescribed.

## 2020-04-23 NOTE — Telephone Encounter (Signed)
Instructed patient that the prescriptions were to last 30 days and Dr Holley Raring was out of town at this time and he could fill the next script on 05-02-2020.  Patient states understanding.

## 2020-04-24 ENCOUNTER — Telehealth: Payer: Self-pay | Admitting: Student in an Organized Health Care Education/Training Program

## 2020-04-24 NOTE — Telephone Encounter (Signed)
I do not see that the dose of the Oxycodone has changed. Do you have any input?

## 2020-04-24 NOTE — Telephone Encounter (Signed)
No he is scheduled on 05-06-20 for a procedure

## 2020-04-24 NOTE — Telephone Encounter (Signed)
Patient called stating Dr. Holley Raring told him to take meds 3x day but did not change his script. He knows Dr. Holley Raring is out until next week. Would like this message to be passed on to Dr. Holley Raring so he can make sure patient doesn't run out again. Patient is having a lot of pain and wants to move procedure up if at all possible.

## 2020-04-24 NOTE — Telephone Encounter (Signed)
Did you move the appt up to 05-09-20?

## 2020-04-29 ENCOUNTER — Telehealth: Payer: Self-pay | Admitting: *Deleted

## 2020-04-29 NOTE — Telephone Encounter (Signed)
I explained that Oxycodone in instructed to be taken as needed, not around the clock every 8 hours. Patient asked if there are any sooner appointments than the scheduled appt on 7-19. Secretary informed med that there are no available appts.

## 2020-04-29 NOTE — Telephone Encounter (Signed)
Called patient regarding is phone call last week stating he is out of Oxycodone. As Dr. Holley Raring requested, I informed patient that meds must last 30 days, there will be no early refills.

## 2020-04-30 ENCOUNTER — Encounter: Payer: Self-pay | Admitting: Student in an Organized Health Care Education/Training Program

## 2020-05-06 ENCOUNTER — Other Ambulatory Visit: Payer: Self-pay

## 2020-05-06 ENCOUNTER — Encounter: Payer: Self-pay | Admitting: Student in an Organized Health Care Education/Training Program

## 2020-05-06 ENCOUNTER — Ambulatory Visit (HOSPITAL_BASED_OUTPATIENT_CLINIC_OR_DEPARTMENT_OTHER): Payer: Self-pay | Admitting: Student in an Organized Health Care Education/Training Program

## 2020-05-06 ENCOUNTER — Ambulatory Visit
Admission: RE | Admit: 2020-05-06 | Discharge: 2020-05-06 | Disposition: A | Discharge status: Home / Self Care | Payer: Self-pay | Source: Ambulatory Visit | Attending: Student in an Organized Health Care Education/Training Program | Admitting: Student in an Organized Health Care Education/Training Program

## 2020-05-06 VITALS — BP 149/82 | HR 97 | Temp 97.3°F | Resp 16 | Ht 73.0 in | Wt 210.0 lb

## 2020-05-06 DIAGNOSIS — M48061 Spinal stenosis, lumbar region without neurogenic claudication: Secondary | ICD-10-CM | POA: Insufficient documentation

## 2020-05-06 DIAGNOSIS — G894 Chronic pain syndrome: Secondary | ICD-10-CM

## 2020-05-06 DIAGNOSIS — M5416 Radiculopathy, lumbar region: Secondary | ICD-10-CM | POA: Insufficient documentation

## 2020-05-06 MED ORDER — IOHEXOL 180 MG/ML  SOLN
10.0000 mL | Freq: Once | INTRAMUSCULAR | Status: DC
  Filled 2020-05-06: qty 20

## 2020-05-06 MED ORDER — SODIUM CHLORIDE 0.9% FLUSH: 2.0000 mL | Freq: Once | INTRAVENOUS | Status: DC

## 2020-05-06 MED ORDER — DEXAMETHASONE SODIUM PHOSPHATE 10 MG/ML IJ SOLN
10.0000 mg | Freq: Once | INTRAMUSCULAR | Status: DC
  Filled 2020-05-06: qty 1

## 2020-05-06 MED ORDER — ROPIVACAINE HCL 2 MG/ML IJ SOLN
2.0000 mL | Freq: Once | INTRAMUSCULAR | Status: DC
  Filled 2020-05-06: qty 10

## 2020-05-06 MED ORDER — LIDOCAINE HCL 2 % IJ SOLN
20.0000 mL | Freq: Once | INTRAMUSCULAR | Status: DC
  Filled 2020-05-06: qty 10

## 2020-05-06 NOTE — Patient Instructions (Signed)

## 2020-05-06 NOTE — Progress Notes (Signed)
Patient's Name: Anthony Nguyen  MRN: [id number]  Referring Provider: Nelle Don, MD  DOB: Feb 27, 1956  PCP: Nelle Don, MD  DOS: 05/06/2020  Note by: [name], MD  Service setting: Ambulatory outpatient  Specialty: Interventional Pain Management  Patient type: Established  Location: [facility] (AMB) Pain Management Facility  Visit type: Interventional Procedure   Primary Reason for Visit: Interventional Pain Management Treatment. CC: Back Pain (right)  Procedure:       Anesthesia, Analgesia, Anxiolysis:  Type: Therapeutic Inter-Laminar Epidural Steroid Injection #2 (in 2021) Region: Lumbar Level: L4-5 Level. Laterality: Right-Sided         Type: Local Anesthesia Indication(s): Analgesia and Anxiety Route: Infiltration ([place]/IM) IV Access: Declined Sedation: Declined  Local Anesthetic: Lidocaine 1%   Indications: 1. Lumbar radiculopathy   2. Lumbar foraminal stenosis   3. Chronic pain syndrome    Pain Score: Pre-procedure: 10-Worst pain ever/10 Post-procedure: 0-No pain/10  Pre-op Assessment:  Anthony Nguyen is a 64 y.o. (year old), male patient, seen today for interventional treatment. 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 Anthony Nguyen has a current medication list which includes the following prescription(s): albuterol, vitamin c, vitamin d3, diclofenac sodium, duloxetine, uribel, midodrine, nicotine, omega-3, oxycodone hcl, [START ON 06/01/2020] oxycodone hcl, [START ON 07/01/2020] oxycodone hcl, probiotic product, silodosin, tizanidine, vitamin b-12, zinc, and pantoprazole, and the following Facility-Administered Medications: dexamethasone, iohexol, lidocaine, ropivacaine (pf) 2 mg/ml (0.2%), and sodium chloride flush. His primarily concern today is the Back Pain (right)  Initial Vital Signs:  Pulse Rate: 94 Temp: (!) 97.3 F  (36.3 C) Resp: 18 BP: (!) 146/94 SpO2: 98 %  BMI: Estimated body mass index is 27.71 kg/m as calculated from the following:   Height as of this encounter: 6\' 1"  (1.854 m).   Weight as of this encounter: 210 lb (95.3 kg).  Risk Assessment: Allergies: Reviewed. He has No Known Allergies.  Allergy Precautions: None required Coagulopathies: Reviewed. None identified.  Blood-thinner therapy: None at this time Active Infection(s): Reviewed. None identified. Anthony Nguyen is afebrile  Site Confirmation: Anthony Nguyen was asked to confirm the procedure and laterality before marking the site Procedure checklist: Completed Consent: Before the procedure and under the influence of no sedative(s), amnesic(s), or anxiolytics, the patient was informed of the treatment options, risks and possible complications. To fulfill our ethical and legal obligations, as recommended by the American Medical Association's Code of Ethics, I have informed the patient of my clinical impression; the nature and purpose of the treatment or procedure; the risks, benefits, and possible complications of the intervention; the alternatives, including doing nothing; the risk(s) and benefit(s) of the alternative treatment(s) or procedure(s); and the risk(s) and benefit(s) of doing nothing. The patient was provided information about the general risks and possible complications associated with the procedure. These may include, but are not limited to: failure to achieve desired goals, infection, bleeding, organ or nerve damage, allergic reactions, paralysis, and death. In addition, the patient was informed of those risks and complications associated to Spine-related procedures, such as failure to decrease pain; infection (i.e.: Meningitis, epidural or intraspinal abscess); bleeding (i.e.: epidural hematoma, subarachnoid hemorrhage, or any other type of intraspinal or peri-dural bleeding); organ or nerve damage (i.e.: Any type of  peripheral nerve, nerve root, or spinal cord injury) with subsequent damage to sensory, motor, and/or autonomic systems, resulting in permanent pain, numbness, and/or weakness of one or  several areas of the body; allergic reactions; (i.e.: anaphylactic reaction); and/or death. Furthermore, the patient was informed of those risks and complications associated with the medications. These include, but are not limited to: allergic reactions (i.e.: anaphylactic or anaphylactoid reaction(s)); adrenal axis suppression; blood sugar elevation that in diabetics may result in ketoacidosis or comma; water retention that in patients with history of congestive heart failure may result in shortness of breath, pulmonary edema, and decompensation with resultant heart failure; weight gain; swelling or edema; medication-induced neural toxicity; particulate matter embolism and blood vessel occlusion with resultant organ, and/or nervous system infarction; and/or aseptic necrosis of one or more joints. Finally, the patient was informed that Medicine is not an exact science; therefore, there is also the possibility of unforeseen or unpredictable risks and/or possible complications that may result in a catastrophic outcome. The patient indicated having understood very clearly. We have given the patient no guarantees and we have made no promises. Enough time was given to the patient to ask questions, all of which were answered to the patient's satisfaction. Anthony Nguyen has indicated that he wanted to continue with the procedure. Attestation: I, the ordering provider, attest that I have discussed with the patient the benefits, risks, side-effects, alternatives, likelihood of achieving goals, and potential problems during recovery for the procedure that I have provided informed consent. Date  Time:   Pre-Procedure Preparation:  Monitoring: As per clinic protocol. Respiration, ETCO2, SpO2, BP, heart rate and rhythm monitor placed and  checked for adequate function Safety Precautions: Patient was assessed for positional comfort and pressure points before starting the procedure. Time-out: I initiated and conducted the "Time-out" before starting the procedure, as per protocol. The patient was asked to participate by confirming the accuracy of the "Time Out" information. Verification of the correct person, site, and procedure were performed and confirmed by me, the nursing staff, and the patient. "Time-out" conducted as per Joint Commission's Universal Protocol (UP.01.01.01). Time: 1002  Description of Procedure:       Position: Prone with head of the table was raised to facilitate breathing. Target Area: The interlaminar space, initially targeting the lower laminar border of the superior vertebral body. Approach: Paramedial approach. Area Prepped: Entire Posterior Lumbar Region Prepping solution: ChloraPrep (2% chlorhexidine gluconate and 70% isopropyl alcohol) Safety Precautions: Aspiration looking for blood return was conducted prior to all injections. At no point did we inject any substances, as a needle was being advanced. No attempts were made at seeking any paresthesias. Safe injection practices and needle disposal techniques used. Medications properly checked for expiration dates. SDV (single dose vial) medications used. Description of the Procedure: Protocol guidelines were followed. The procedure needle was introduced through the skin, ipsilateral to the reported pain, and advanced to the target area. Bone was contacted and the needle walked caudad, until the lamina was cleared. The epidural space was identified using "loss-of-resistance technique" with 2-3 ml of PF-NaCl (0.9% NSS), in a 5cc LOR glass syringe. Vitals:   05/06/20 0915 05/06/20 0916 05/06/20 1000 05/06/20 1010  BP:  (!) 146/94 (!) 151/83 (!) 149/82  Pulse:  94 96 97  Resp:  18 20 16   Temp: (!) 97.3 F (36.3 C)     SpO2:  98% 99% 99%  Weight: 210 lb (95.3  kg)     Height: 6\' 1"  (1.854 m)       Start Time: 1002 hrs. End Time: 1010 hrs. Materials:  Needle(s) Type: Epidural needle Gauge: 25G Length: 3.5-in Medication(s): Please see orders for  medications and dosing details. 8CC solution made of 4 cc of preservative-free saline, 3 cc of 0.2% ropivacaine, 1 cc of Decadron 10 mg/cc Imaging Guidance (Spinal):  Type of Imaging Technique: Fluoroscopy Guidance (Spinal) Indication(s): Assistance in needle guidance and placement for procedures requiring needle placement in or near specific anatomical locations not easily accessible without such assistance. Exposure Time: Please see nurses notes. Contrast: Before injecting any contrast, we confirmed that the patient did not have an allergy to iodine, shellfish, or radiological contrast. Once satisfactory needle placement was completed at the desired level, radiological contrast was injected. Contrast injected under live fluoroscopy. No contrast complications. See chart for type and volume of contrast used. Fluoroscopic Guidance: I was personally present during the use of fluoroscopy. "Tunnel Vision Technique" used to obtain the best possible view of the target area. Parallax error corrected before commencing the procedure. "Direction-depth-direction" technique used to introduce the needle under continuous pulsed fluoroscopy. Once target was reached, antero-posterior, oblique, and lateral fluoroscopic projection used confirm needle placement in all planes. Images permanently stored in EMR. Interpretation: I personally interpreted the imaging intraoperatively. Adequate needle placement confirmed in multiple planes. Appropriate spread of contrast into desired area was observed. No evidence of afferent or efferent intravascular uptake. No intrathecal or subarachnoid spread observed. Permanent images saved into the patient's record.  Antibiotic Prophylaxis:   Anti-infectives (From admission, onward)   None      Indication(s): None identified  Post-operative Assessment:  Post-procedure Vital Signs:  Pulse Rate: 97 Temp: (!) 97.3 F (36.3 C) Resp: 16 BP: (!) 149/82 SpO2: 99 %  EBL: None  Complications: No immediate post-treatment complications observed by team, or reported by patient.  Note: The patient tolerated the entire procedure well. A repeat set of vitals were taken after the procedure and the patient was kept under observation following institutional policy, for this type of procedure. Post-procedural neurological assessment was performed, showing return to baseline, prior to discharge. The patient was provided with post-procedure discharge instructions, including a section on how to identify potential problems. Should any problems arise concerning this procedure, the patient was given instructions to immediately contact us, at any time, without hesitation. In any case, we plan to contact the patient by telephone for a follow-up status report regarding this interventional procedure.  Comments:  No additional relevant information. 5 out of 5 strength bilateral lower extremity: Plantar flexion, dorsiflexion, knee flexion, knee extension.  Plan of Care    Imaging Orders     DG PAIN CLINIC C-ARM 1-60 MIN NO REPORT  Medications ordered for procedure: Meds ordered this encounter  Medications  . iohexol (OMNIPAQUE) 180 MG/ML injection 10 mL    Must be Myelogram-compatible. If not available, you may substitute with a water-soluble, non-ionic, hypoallergenic, myelogram-compatible radiological contrast medium.  Marland Kitchen lidocaine (XYLOCAINE) 2 % (with pres) injection 400 mg  . ropivacaine (PF) 2 mg/mL (0.2%) (NAROPIN) injection 2 mL  . sodium chloride flush (NS) 0.9 % injection 2 mL  . dexamethasone (DECADRON) injection 10 mg   Orders Placed This Encounter  Procedures  . DG PAIN CLINIC C-ARM 1-60 MIN NO REPORT    Intraoperative interpretation by procedural physician at [place].    Standing Status:   Standing    Number of Occurrences:   1    Order Specific Question:   Reason for exam:    Answer:   Assistance in needle guidance and placement for procedures requiring needle placement in or near specific anatomical locations not easily accessible  without such assistance.     Medications administered: Shanon Brow L. Estrin had no medications administered during this visit.  See the medical record for exact dosing, route, and time of administration.  Disposition: Discharge home  Discharge Date & Time: 05/06/2020; 1016 hrs.   Physician-requested Follow-up: Return for Keep sch. appt.  Future Appointments  Date Time Provider [place]  07/18/2020 11:00 AM [name], MD [facility] None   Primary Care Physician: Nelle Don, MD Location: [facility] Note by: [name], MD Date: 05/06/2020; Time: 10:23 AM  Disclaimer:  Medicine is not an exact science. The only guarantee in medicine is that nothing is guaranteed. It is important to note that the decision to proceed with this intervention was based on the information collected from the patient. The Data and conclusions were drawn from the patient's questionnaire, the interview, and the physical examination. Because the information was provided in large part by the patient, it cannot be guaranteed that it has not been purposely or unconsciously manipulated. Every effort has been made to obtain as much relevant data as possible for this evaluation. It is important to note that the conclusions that lead to this procedure are derived in large part from the available data. Always take into account that the treatment will also be dependent on availability of resources and existing treatment guidelines, considered by other Pain Management Practitioners as being common knowledge and practice, at the time of the intervention. For Medico-Legal purposes, it is also important to point  out that variation in procedural techniques and pharmacological choices are the acceptable norm. The indications, contraindications, technique, and results of the above procedure should only be interpreted and judged by a Board-Certified Interventional Pain Specialist with extensive familiarity and expertise in the same exact procedure and technique.

## 2020-05-06 NOTE — Progress Notes (Signed)
Safety precautions to be maintained throughout the outpatient stay will include: orient to surroundings, keep bed in low position, maintain call bell within reach at all times, provide assistance with transfer out of bed and ambulation.  

## 2020-05-07 ENCOUNTER — Telehealth: Payer: Self-pay

## 2020-05-07 NOTE — Telephone Encounter (Signed)
Post procedure phone call. Patient states he is doing well.  

## 2020-06-20 ENCOUNTER — Telehealth: Payer: Self-pay

## 2020-07-18 ENCOUNTER — Encounter: Payer: Self-pay | Admitting: Student in an Organized Health Care Education/Training Program

## 2020-07-18 ENCOUNTER — Other Ambulatory Visit: Payer: Self-pay

## 2020-07-18 ENCOUNTER — Ambulatory Visit
Payer: Self-pay | Attending: Student in an Organized Health Care Education/Training Program | Admitting: Student in an Organized Health Care Education/Training Program

## 2020-07-18 VITALS — BP 155/88 | HR 102 | Resp 18 | Ht 73.0 in | Wt 215.0 lb

## 2020-07-18 DIAGNOSIS — M5416 Radiculopathy, lumbar region: Secondary | ICD-10-CM

## 2020-07-18 DIAGNOSIS — M170 Bilateral primary osteoarthritis of knee: Secondary | ICD-10-CM

## 2020-07-18 DIAGNOSIS — M48061 Spinal stenosis, lumbar region without neurogenic claudication: Secondary | ICD-10-CM

## 2020-07-18 DIAGNOSIS — G894 Chronic pain syndrome: Secondary | ICD-10-CM

## 2020-07-18 DIAGNOSIS — Z96652 Presence of left artificial knee joint: Secondary | ICD-10-CM

## 2020-07-18 DIAGNOSIS — M25562 Pain in left knee: Secondary | ICD-10-CM

## 2020-07-18 DIAGNOSIS — G8929 Other chronic pain: Secondary | ICD-10-CM

## 2020-07-18 MED ORDER — OXYCODONE HCL 10 MG PO TABS: 10.0000 mg | ORAL_TABLET | Freq: Three times a day (TID) | ORAL | 0 refills | Status: AC | PRN

## 2020-07-18 MED ORDER — GABAPENTIN 300 MG PO CAPS: 300.0000 mg | ORAL_CAPSULE | Freq: Two times a day (BID) | ORAL | 2 refills | Status: DC

## 2020-07-18 MED ORDER — OXYCODONE HCL 10 MG PO TABS: 10.0000 mg | ORAL_TABLET | Freq: Three times a day (TID) | ORAL | 0 refills | Status: DC | PRN

## 2020-07-18 NOTE — Progress Notes (Signed)
PROVIDER NOTE: Information contained herein reflects review and annotations entered in association with encounter. Interpretation of such information and data should be left to medically-trained personnel. Information provided to patient can be located elsewhere in the medical record under "Patient Instructions". Document created using STT-dictation technology, any transcriptional errors that may result from process are unintentional.    Patient: Anthony Nguyen  Service Category: E/M  Provider: [name], MD  DOB: March 19, 1956  DOS: 07/18/2020  Specialty: Interventional Pain Management  MRN: [id number]  Setting: Ambulatory outpatient  PCP: Nelle Don, MD  Type: Established Patient    Referring Provider: Nelle Don, MD  Location: Office  Delivery: Face-to-face     HPI  Reason for encounter: Mr. DYON ROTERT, a 64 y.o. year old male, is here today for evaluation and management of his Lumbar radiculopathy [M54.16]. Mr. Rayos's primary complain today is Back Pain (low) Last encounter: Practice (06/20/2020). My last encounter with him was on 05/06/2020. Pertinent problems: Mr. Goren has Primary osteoarthritis of left knee; Lumbar radiculopathy; Lumbar degenerative disc disease; Lumbar foraminal stenosis; History of left knee replacement; Primary osteoarthritis of both knees; and Chronic pain syndrome on their pertinent problem list. Pain Assessment: Severity of Chronic pain is reported as a 5 /10. Location: Back Lower, Right/radiates down right leg to foot. Onset: More than a month ago. Quality: Spasm, Other (Comment), Tingling. Timing: Constant. Modifying factor(s): injections. Vitals:  height is '6\' 1"'  (1.854 m) and weight is 215 lb (97.5 kg). His blood pressure is 155/88 (abnormal) and his pulse is 102 (abnormal). His respiration is 18 and oxygen saturation is 97%.   Patient presents today for medication management.  Of note he presented to the emergency department on 06/20/2020  for increased low back pain and radiation into bilateral lower extremity, right greater than left.  Patient saw Dr. Dalbert Batman with physical medicine and rehab at [facility] and had a L4-L5 interlaminar steroid injection performed.  Patient states that the injection was helpful for his pain symptoms but now he is endorsing return of lower extremity pain that seems to be dermatomal in nature.  Prior epidural steroid injections that I have done on the patient have been at L4-L5 as well.  They have been directed more towards the right given at that time he was having more right lower extremity pain.  Patient states that the injection has allowed him to utilize less oxycodone, usually 2 tablets on most days.  He did have a cervical, thoracic, lumbar MRI performed, results of which are below.  He has an upcoming appointment with neurosurgery next month.  I have encouraged him to discuss their thoughts on a spinal cord stimulator trial with the patient.  I am happy to offer this to the patient if he would like to proceed with it.  Otherwise can repeat lumbar epidural steroid injection as below. Pharmacotherapy Assessment   07/02/2020  1   04/18/2020  Oxycodone Hcl 10 MG Tablet  75.00  25 Bi Lat   [id number]   [place] (1669)   0/0  45.00 MME  Comm Ins   [place]      Analgesic: Oxycodone 10 mg 3 times daily as needed, maximum 75/month MME equals 45.   Monitoring: [place] PMP: PDMP reviewed during this encounter.       Pharmacotherapy: No side-effects or adverse reactions reported. Compliance: No problems identified. Effectiveness: Clinically acceptable.  Dewayne Shorter, RN  07/18/2020 11:13 AM  Signed Nursing Pain Medication Assessment:  Safety precautions to  be maintained throughout the outpatient stay will include: orient to surroundings, keep bed in low position, maintain call bell within reach at all times, provide assistance with transfer out of bed and ambulation.  Medication Inspection Compliance: Mr. Kurek did not comply  with our request to bring his pills to be counted. He was reminded that bringing the medication bottles, even when empty, is a requirement.  Medication: None brought in. Pill/Patch Count: None available to be counted. Bottle Appearance: No container available. Did not bring bottle(s) to appointment. Filled Date: N/A Last Medication intake:  Today    UDS:  Summary  Date Value Ref Range Status  03/27/2020 Note  Final    Comment:    ==================================================================== [employer] Select 13 (MW) ==================================================================== Test                             Result       Flag       Units  Drug Present and Declared for Prescription Verification   Oxycodone                      1653         EXPECTED   ng/mg creat   Oxymorphone                    512          EXPECTED   ng/mg creat   Noroxycodone                   >3876        EXPECTED   ng/mg creat   Noroxymorphone                 387          EXPECTED   ng/mg creat    Sources of oxycodone are scheduled prescription medications.    Oxymorphone, noroxycodone, and noroxymorphone are expected    metabolites of oxycodone. Oxymorphone is also available as a    scheduled prescription medication.  ==================================================================== Test                      Result    Flag   Units      Ref Range   Creatinine              258              mg/dL      >=20 ==================================================================== Declared Medications:  The flagging and interpretation on this report are based on the  following declared medications.  Unexpected results may arise from  inaccuracies in the declared medications.   **Note: The testing scope of this panel includes these medications:   Oxycodone   **Note: The testing scope of this panel does not include the  following reported medications:   Albuterol (Ventolin HFA)  Cyanocobalamin   Hyoscyamine (Uribel)  Methenamine (Uribel)  Methylene Blue (Uribel)  Midodrine (Proamatine)  Omega-3 Fatty Acids  Phenyl salicylate (Uribel)  Probiotic  Sodium phosphate, monobasic (Uribel)  Tizanidine (Zanaflex)  Topical Diclofenac  Vitamin C  Vitamin D3  Zinc ==================================================================== For clinical consultation, please call [phone]. ====================================================================      ROS  Constitutional: Denies any fever or chills Gastrointestinal: No reported hemesis, hematochezia, vomiting, or acute GI distress Musculoskeletal: Low back, bilateral leg pain Neurological: No reported episodes of acute onset apraxia, aphasia, dysarthria, agnosia, amnesia, paralysis,  loss of coordination, or loss of consciousness  Medication Review  DULoxetine, Diclofenac Sodium, Oxycodone HCl, Zinc, albuterol, gabapentin, lisinopril, nicotine, silodosin, tiZANidine, and vitamin B-12  History Review  Allergy: Mr. Bones has No Known Allergies. Drug: Mr. Rivkin  reports no history of drug use. Alcohol:  reports no history of alcohol use. Tobacco:  reports that he has been smoking cigarettes. He has a 60.00 pack-year smoking history. He uses smokeless tobacco. Social: Mr. Hanser  reports that he has been smoking cigarettes. He has a 60.00 pack-year smoking history. He uses smokeless tobacco. He reports that he does not drink alcohol and does not use drugs. Medical:  has a past medical history of Allergy, Arthritis, Cancer ([place]), Chronic back pain, Current every day smoker, Diverticulosis, GERD (gastroesophageal reflux disease), H/O diverticulitis of colon, Restless leg syndrome, and Sciatic pain. Surgical: Mr. Jurney  has a past surgical history that includes Tonsillectomy and adenoidectomy; Total knee arthroplasty (Left, 10/14/2017); skin cancer removal; and Joint replacement (Left, 10/14/2018). Family: family  history includes Alzheimer's disease in his father; Cancer (age of onset: 26) in his sister; Diabetes in his sister; Healthy in his brother, son, son, son, and son; Heart disease in his brother; Hypertension in his mother; Prostate cancer (age of onset: 34) in his father.  Laboratory Chemistry Profile   Renal Lab Results  Component Value Date   BUN 13 04/18/2019   CREATININE 0.86 04/18/2019   GFR 68.66 04/02/2014   GFRAA >60 04/18/2019   GFRNONAA >60 04/18/2019     Hepatic Lab Results  Component Value Date   AST 13 (L) 04/18/2019   ALT 15 04/18/2019   ALBUMIN 4.6 04/18/2019   ALKPHOS 66 04/18/2019   LIPASE 44 03/22/2019     Electrolytes Lab Results  Component Value Date   NA 143 04/18/2019   K 3.7 04/18/2019   CL 108 04/18/2019   CALCIUM 9.6 04/18/2019     Bone No results found for: VD25OH, VD125OH2TOT, HU3149FW2, OV7858IF0, 25OHVITD1, 25OHVITD2, 25OHVITD3, TESTOFREE, TESTOSTERONE   Inflammation (CRP: Acute Phase) (ESR: Chronic Phase) Lab Results  Component Value Date   ESRSEDRATE 30 (H) 09/29/2017       Note: Above Lab results reviewed.   Physical Exam  General appearance: Well nourished, well developed, and well hydrated. In no apparent acute distress Mental status: Alert, oriented x 3 (person, place, & time)       Respiratory: No evidence of acute respiratory distress Eyes: PERLA Vitals: BP (!) 155/88   Pulse (!) 102   Resp 18   Ht '6\' 1"'  (1.854 m)   Wt 215 lb (97.5 kg)   SpO2 97%   BMI 28.37 kg/m  BMI: Estimated body mass index is 28.37 kg/m as calculated from the following:   Height as of this encounter: '6\' 1"'  (1.854 m).   Weight as of this encounter: 215 lb (97.5 kg). Ideal: Ideal body weight: 79.9 kg (176 lb 2.4 oz) Adjusted ideal body weight: 86.9 kg (191 lb 11 oz)  Lumbar Spine Area Exam  Skin & Axial Inspection: No masses, redness, or swelling Alignment: Symmetrical Functional ROM: Unrestricted ROM       Stability: No instability  detected Muscle Tone/Strength: Functionally intact. No obvious neuro-muscular anomalies detected. Sensory (Neurological): Dermatomal pain pattern RIGHT L4/5 Palpation: No palpable anomalies       Provocative Tests: Hyperextension/rotation test: deferred today       Lumbar quadrant test (Kemp's test): (+) on the right for foraminal stenosis Lateral bending test: deferred today  Patrick's Maneuver: deferred today                   FABER* test: deferred today                   S-I anterior distraction/compression test: deferred today         S-I lateral compression test: deferred today         S-I Thigh-thrust test: deferred today         S-I Gaenslen's test: deferred today         *(Flexion, ABduction and External Rotation) Gait & Posture Assessment  Ambulation: Unassisted Gait: Relatively normal for age and body habitus Posture: WNL  Lower Extremity Exam    Side: Right lower extremity  Side: Left lower extremity  Stability: No instability observed          Stability: No instability observed          Skin & Extremity Inspection: Skin color, temperature, and hair growth are WNL. No peripheral edema or cyanosis. No masses, redness, swelling, asymmetry, or associated skin lesions. No contractures.  Skin & Extremity Inspection: Skin color, temperature, and hair growth are WNL. No peripheral edema or cyanosis. No masses, redness, swelling, asymmetry, or associated skin lesions. No contractures.  Functional ROM: Unrestricted ROM                  Functional ROM: Unrestricted ROM                  Muscle Tone/Strength: Functionally intact. No obvious neuro-muscular anomalies detected.  Muscle Tone/Strength: Functionally intact. No obvious neuro-muscular anomalies detected.  Sensory (Neurological): Dermatomal pain pattern        Sensory (Neurological): Unimpaired        DTR: Patellar: deferred today Achilles: deferred today Plantar: deferred today  DTR: Patellar: deferred  today Achilles: deferred today Plantar: deferred today  Palpation: No palpable anomalies  Palpation: No palpable anomalies   Assessment   Status Diagnosis  Persistent Persistent Persistent 1. Lumbar radiculopathy   2. Lumbar foraminal stenosis   3. Primary osteoarthritis of both knees   4. History of left knee replacement   5. Chronic pain of left knee   6. Chronic pain syndrome       Plan of Care   Mr. QUENTYN KOLBECK has a current medication list which includes the following long-term medication(s): duloxetine and gabapentin.  1.  Refill oxycodone as below.  No change in dose. 2.  Start gabapentin as below 3.  Follow-up with neurosurgery.  Consideration of spinal cord stimulator trial for persistent lumbar radicular pain. 4.  Repeat lumbar epidural steroid injection as needed   Pharmacotherapy (Medications Ordered): Meds ordered this encounter  Medications  . Oxycodone HCl 10 MG TABS    Sig: Take 1 tablet (10 mg total) by mouth 3 (three) times daily as needed.    Dispense:  75 tablet    Refill:  0    For chronic pain syndrome. Not to exceed 75 tablets/month  . Oxycodone HCl 10 MG TABS    Sig: Take 1 tablet (10 mg total) by mouth 3 (three) times daily as needed.    Dispense:  75 tablet    Refill:  0    For chronic pain syndrome. Not to exceed 75 tablets/month  . Oxycodone HCl 10 MG TABS    Sig: Take 1 tablet (10 mg total) by mouth 3 (three) times daily as needed.    Dispense:  75 tablet    Refill:  0    For chronic pain syndrome. Not to exceed 75 tablets/month  . gabapentin (NEURONTIN) 300 MG capsule    Sig: Take 1 capsule (300 mg total) by mouth 2 (two) times daily.    Dispense:  60 capsule    Refill:  2   Orders:  Orders Placed This Encounter  Procedures  . Lumbar Epidural Injection    Standing Status:   Standing    Number of Occurrences:   9    Standing Expiration Date:   07/18/2021    Scheduling Instructions:     Purpose: Palliative     Indication:  Lower extremity pain/Sciatica unspecified side (M54.30).     Side: Midline     Level: TBD     Sedation: Patient's choice.     TIMEFRAME: PRN procedure. (Mr. Moga will call when needed.)    Order Specific Question:   Where will this procedure be performed?    Answer:   [facility]   Follow-up plan:   Return in about 3 months (around 10/17/2020) for Medication Management, in person.   Recent Visits Date Type Provider Dept  05/06/20 Procedure visit [name], MD [facility]  Showing recent visits within past 90 days and meeting all other requirements Today's Visits Date Type Provider Dept  07/18/20 Office Visit [name], MD [facility]  Showing today's visits and meeting all other requirements Future Appointments Date Type Provider Dept  10/10/20 Appointment [name], MD [facility]  Showing future appointments within next 90 days and meeting all other requirements  I discussed the assessment and treatment plan with the patient. The patient was provided an opportunity to ask questions and all were answered. The patient agreed with the plan and demonstrated an understanding of the instructions.  Patient advised to call back or seek an in-person evaluation if the symptoms or condition worsens.  Duration of encounter: 30 minutes.  Note by: [name], MD Date: 07/18/2020; Time: 12:56 PM

## 2020-07-18 NOTE — Patient Instructions (Signed)
____________________________________________________________________________________________  Preparing for your procedure (without sedation)  Procedure appointments are limited to planned procedures: . No Prescription Refills. . No disability issues will be discussed. . No medication changes will be discussed.  Instructions: . Oral Intake: Do not eat or drink anything for at least 6 hours prior to your procedure. (Exception: Blood Pressure Medication. See below.) . Transportation: Unless otherwise stated by your physician, you may drive yourself after the procedure. . Blood Pressure Medicine: Do not forget to take your blood pressure medicine with a sip of water the morning of the procedure. If your Diastolic (lower reading)is above 100 mmHg, elective cases will be cancelled/rescheduled. . Blood thinners: These will need to be stopped for procedures. Notify our staff if you are taking any blood thinners. Depending on which one you take, there will be specific instructions on how and when to stop it. . Diabetics on insulin: Notify the staff so that you can be scheduled 1st case in the morning. If your diabetes requires high dose insulin, take only  of your normal insulin dose the morning of the procedure and notify the staff that you have done so. . Preventing infections: Shower with an antibacterial soap the morning of your procedure.  . Build-up your immune system: Take 1000 mg of Vitamin C with every meal (3 times a day) the day prior to your procedure. . Antibiotics: Inform the staff if you have a condition or reason that requires you to take antibiotics before dental procedures. . Pregnancy: If you are pregnant, call and cancel the procedure. . Sickness: If you have a cold, fever, or any active infections, call and cancel the procedure. . Arrival: You must be in the facility at least 30 minutes prior to your scheduled procedure. . Children: Do not bring any children with you. . Dress  appropriately: Bring dark clothing that you would not mind if they get stained. . Valuables: Do not bring any jewelry or valuables.  Reasons to call and reschedule or cancel your procedure: (Following these recommendations will minimize the risk of a serious complication.) . Surgeries: Avoid having procedures within 2 weeks of any surgery. (Avoid for 2 weeks before or after any surgery). . Flu Shots: Avoid having procedures within 2 weeks of a flu shots or . (Avoid for 2 weeks before or after immunizations). . Barium: Avoid having a procedure within 7-10 days after having had a radiological study involving the use of radiological contrast. (Myelograms, Barium swallow or enema study). . Heart attacks: Avoid any elective procedures or surgeries for the initial 6 months after a "Myocardial Infarction" (Heart Attack). . Blood thinners: It is imperative that you stop these medications before procedures. Let us know if you if you take any blood thinner.  . Infection: Avoid procedures during or within two weeks of an infection (including chest colds or gastrointestinal problems). Symptoms associated with infections include: Localized redness, fever, chills, night sweats or profuse sweating, burning sensation when voiding, cough, congestion, stuffiness, runny nose, sore throat, diarrhea, nausea, vomiting, cold or Flu symptoms, recent or current infections. It is specially important if the infection is over the area that we intend to treat. . Heart and lung problems: Symptoms that may suggest an active cardiopulmonary problem include: cough, chest pain, breathing difficulties or shortness of breath, dizziness, ankle swelling, uncontrolled high or unusually low blood pressure, and/or palpitations. If you are experiencing any of these symptoms, cancel your procedure and contact your primary care physician for an evaluation.  Remember:  Regular   Business hours are:  Monday to Thursday 8:00 AM to 4:00  PM  Provider's Schedule: Francisco Naveira, MD:  Procedure days: Tuesday and Thursday 7:30 AM to 4:00 PM  Bilal Lateef, MD:  Procedure days: Monday and Wednesday 7:30 AM to 4:00 PM ____________________________________________________________________________________________  Epidural Steroid Injection  An epidural steroid injection is a shot of steroid medicine and numbing medicine that is given into the space between the spinal cord and the bones of the back (epidural space). The shot helps relieve pain caused by an irritated or swollen nerve root. The amount of pain relief you get from the injection depends on what is causing the nerve to be swollen and irritated, and how long your pain lasts. You are more likely to benefit from this injection if your pain is strong and comes on suddenly rather than if you have had long-term (chronic) pain. Tell a health care provider about:  Any allergies you have.  All medicines you are taking, including vitamins, herbs, eye drops, creams, and over-the-counter medicines.  Any problems you or family members have had with anesthetic medicines.  Any blood disorders you have.  Any surgeries you have had.  Any medical conditions you have.  Whether you are pregnant or may be pregnant. What are the risks? Generally, this is a safe procedure. However, problems may occur, including:  Headache.  Bleeding.  Infection.  Allergic reaction to medicines.  Nerve damage. What happens before the procedure? Staying hydrated Follow instructions from your health care provider about hydration, which may include:  Up to 2 hours before the procedure - you may continue to drink clear liquids, such as water, clear fruit juice, black coffee, and plain tea. Eating and drinking restrictions Follow instructions from your health care provider about eating and drinking, which may include:  8 hours before the procedure - stop eating heavy meals or foods, such as  meat, fried foods, or fatty foods.  6 hours before the procedure - stop eating light meals or foods, such as toast or cereal.  6 hours before the procedure - stop drinking milk or drinks that contain milk.  2 hours before the procedure - stop drinking clear liquids. Medicines  You may be given medicines to lower anxiety.  Ask your health care provider about: ? Changing or stopping your regular medicines. This is especially important if you are taking diabetes medicines or blood thinners. ? Taking medicines such as aspirin and ibuprofen. These medicines can thin your blood. Do not take these medicines unless your health care provider tells you to take them. ? Taking over-the-counter medicines, vitamins, herbs, and supplements.  Ask your health care provider what steps will be taken to prevent infection. General instructions  Plan to have someone take you home from the hospital or clinic.  If you will be going home right after the procedure, plan to have someone with you for 24 hours. What happens during the procedure?  An IV will be inserted into one of your veins.  You will be given one or more of the following: ? A medicine to help you relax (sedative). ? A medicine to numb the area (local anesthetic).  You will be asked to lie on your abdomen or sit.  The injection site will be cleaned.  A needle will be inserted through your skin into the epidural space. This may cause you some discomfort. An X-ray machine will be used to guide the needle as close as possible to the affected nerve.  A steroid   medicine and a local anesthetic will be injected into the epidural space.  The needle and IV will be removed.  A bandage (dressing) will be put over the injection site. The procedure may vary among health care providers and hospitals. What can I expect after the procedure? Follow these instructions at home: Injection site care  You may remove the bandage (dressing) after 24  hours.  Check your injection site every day for signs of infection. Check for: ? Redness, swelling, or pain. ? Fluid or blood. ? Warmth. ? Pus or a bad smell. Managing pain, stiffness, and swelling  For 24 hours after the procedure: ? Avoid using heat on the injection site. ? Do not take baths, swim, or use a hot tub until your health care provider approves. Ask your health care provider if you may take a shower. You may only be allowed to take sponge baths.  If directed, put ice on the injection site. To do this: ? Put ice in a plastic bag. ? Place a towel between your skin and the bag. ? Leave the ice on for 20 minutes, 2-3 times a day.  Activity  Do not drive for 24 hours if you were given a sedative during your procedure.  Return to your normal activities as told by your health care provider. Ask your health care provider what activities are safe for you. General instructions  Your blood pressure, heart rate, breathing rate, and blood oxygen level will be monitored until you leave the hospital or clinic.  Your arm or leg may feel weak or numb for a few hours.  The injection site may feel sore.  Take over-the-counter and prescription medicines only as told by your health care provider.  Drink enough fluid to keep your urine pale yellow.  Keep all follow-up visits as told by your health care provider. This is important. Contact a health care provider if:  You have any of these signs of infection: ? Redness, swelling, or pain around your injection site. ? Fluid or blood coming from your injection site. ? Warmth coming from your injection site. ? Pus or a bad smell coming from your injection site. ? A fever.  You continue to have pain and soreness around the injection site, even after taking over-the-counter pain medicine.  You have severe, sudden, or lasting nausea or vomiting. Get help right away if:  You have severe pain at the injection site that is not relieved  by medicines.  You develop a severe headache or a stiff neck.  You become sensitive to light.  You have any new numbness or weakness in your legs or arms.  You lose control of your bladder or bowel movements.  You have trouble breathing. Summary  An epidural steroid injection is a shot of steroid medicine and numbing medicine that is given into the epidural space.  The shot helps relieve pain caused by an irritated or swollen nerve root.  You are more likely to benefit from this injection if your pain is strong and comes on suddenly rather than if you have had chronic pain. This information is not intended to replace advice given to you by your health care provider. Make sure you discuss any questions you have with your health care provider. Document Revised: 04/17/2019 Document Reviewed: 04/17/2019 [employer] Patient Education  2020 [employer]  

## 2020-07-18 NOTE — Progress Notes (Signed)
Nursing Pain Medication Assessment:  Safety precautions to be maintained throughout the outpatient stay will include: orient to surroundings, keep bed in low position, maintain call bell within reach at all times, provide assistance with transfer out of bed and ambulation.  Medication Inspection Compliance: Anthony Nguyen did not comply with our request to bring his pills to be counted. He was reminded that bringing the medication bottles, even when empty, is a requirement.  Medication: None brought in. Pill/Patch Count: None available to be counted. Bottle Appearance: No container available. Did not bring bottle(s) to appointment. Filled Date: N/A Last Medication intake:  Today

## 2020-10-10 ENCOUNTER — Encounter: Payer: Self-pay | Admitting: Student in an Organized Health Care Education/Training Program

## 2020-10-10 IMAGING — DX PORTABLE CHEST - 1 VIEW
1 series · 1 of 1 positions shown · non-contrast
Comparison: 12/16/2015

CLINICAL DATA: Cough, shortness of breath

EXAM:
PORTABLE CHEST 1 VIEW

[chest ap]
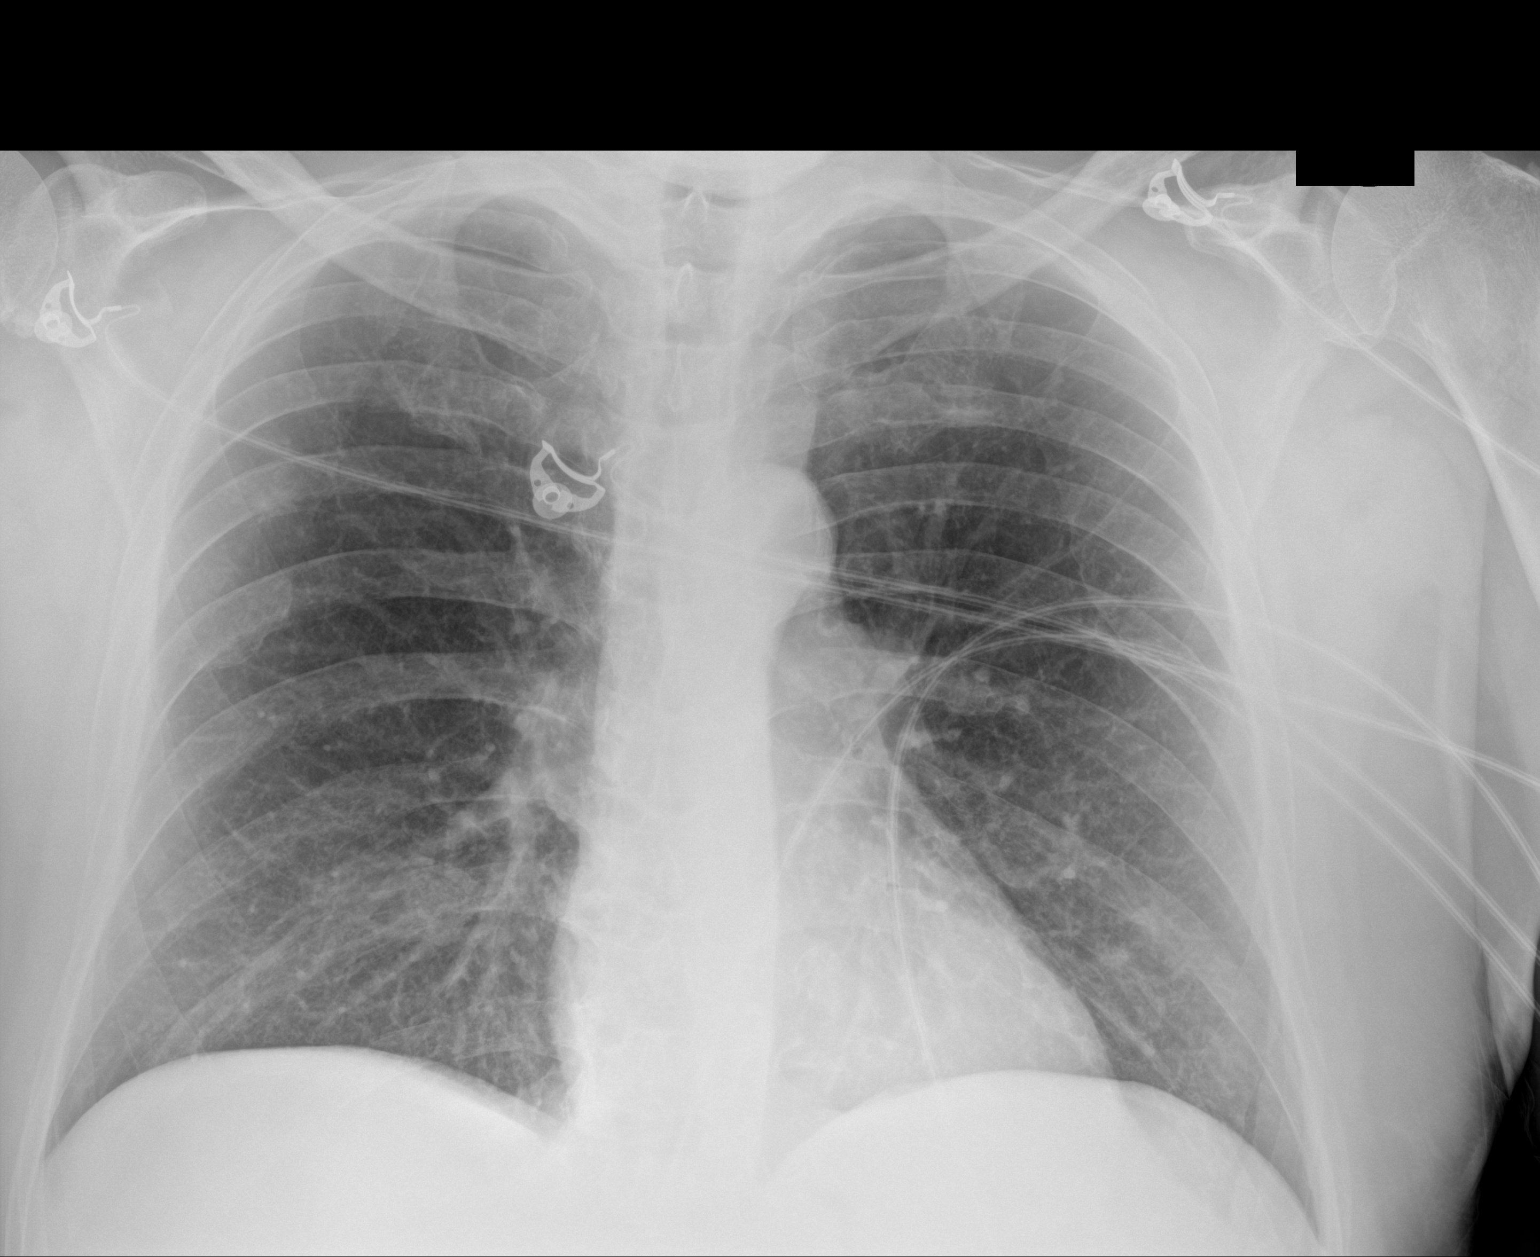

[1 of 1 positions shown; findings below may reference images not displayed]

FINDINGS: Heart and mediastinal contours are within normal limits. No focal
opacities or effusions. No acute bony abnormality.
IMPRESSION: No active cardiopulmonary disease.

## 2020-10-21 ENCOUNTER — Ambulatory Visit
Payer: Self-pay | Attending: Student in an Organized Health Care Education/Training Program | Admitting: Student in an Organized Health Care Education/Training Program

## 2020-10-21 ENCOUNTER — Other Ambulatory Visit: Payer: Self-pay

## 2020-10-21 ENCOUNTER — Encounter: Payer: Self-pay | Admitting: Student in an Organized Health Care Education/Training Program

## 2020-10-21 VITALS — BP 158/84 | HR 109 | Temp 97.9°F | Resp 18 | Ht 73.0 in | Wt 220.0 lb

## 2020-10-21 DIAGNOSIS — G894 Chronic pain syndrome: Secondary | ICD-10-CM | POA: Insufficient documentation

## 2020-10-21 DIAGNOSIS — M48061 Spinal stenosis, lumbar region without neurogenic claudication: Secondary | ICD-10-CM | POA: Insufficient documentation

## 2020-10-21 DIAGNOSIS — M5416 Radiculopathy, lumbar region: Secondary | ICD-10-CM | POA: Insufficient documentation

## 2020-10-21 MED ORDER — OXYCODONE HCL 10 MG PO TABS: 10.0000 mg | ORAL_TABLET | Freq: Three times a day (TID) | ORAL | 0 refills | Status: DC | PRN

## 2020-10-21 NOTE — Progress Notes (Signed)
PROVIDER NOTE: Information contained herein reflects review and annotations entered in association with encounter. Interpretation of such information and data should be left to medically-trained personnel. Information provided to patient can be located elsewhere in the medical record under "Patient Instructions". Document created using STT-dictation technology, any transcriptional errors that may result from process are unintentional.    Patient: Anthony Nguyen  Service Category: E/M  Provider: [name], MD  DOB: 07/23/1956  DOS: 10/21/2020  Specialty: Interventional Pain Management  MRN: [id number]  Setting: Ambulatory outpatient  PCP: Nelle Don, MD  Type: Established Patient    Referring Provider: Nelle Don, MD  Location: Office  Delivery: Face-to-face     HPI  [name], a 65 y.o. year old male, is here today because of his Lumbar radiculopathy [M54.16]. Mr. Rubey's primary complain today is Back Pain (low) Last encounter: My last encounter with him was on 10/10/2020. Pertinent problems: Mr. Ishmael has Primary osteoarthritis of left knee; Lumbar radiculopathy; Lumbar degenerative disc disease; Lumbar foraminal stenosis; History of left knee replacement; Primary osteoarthritis of both knees; and Chronic pain syndrome on their pertinent problem list. Pain Assessment: Severity of Chronic pain is reported as a 9 /10. Location: Back Lower/radiates down both legs. Onset: More than a month ago. Quality: Burning,Stabbing,Throbbing. Timing: Constant. Modifying factor(s): nothing. Vitals:  height is '6\' 1"'  (1.854 m) and weight is 220 lb (99.8 kg). His temperature is 97.9 F (36.6 C). His blood pressure is 158/84 (abnormal) and his pulse is 109 (abnormal). His respiration is 18 and oxygen saturation is 97%.   Reason for encounter: medication management.    Patient follows up today for medication management.  He is complaining of persistent and worsening low back pain  that radiates into bilateral lower extremities.  Since the patient's last visit with me, he has received 2 series of lumbar epidural steroid injections at [facility] with Dr. Dalbert Batman (bilateral L4-L5 transforaminal epidural steroid injection on 08/29/2020 and 10/17/2020) which unfortunately were not helpful for his low back versus radicular lower extremity pain.  He states that he had them done at [facility] as he is on charity care there and the injections only cost $10.  He is seeing neurosurgeon next month.  At this point, I have limited options for him from an interventional standpoint other than considering a spinal cord stimulator trial for persistent lumbar radicular pain and lower extremity neuropathic pain.  Patient states that he will discuss this with his neurosurgeon, Dr. Glade Nurse.  If surgery is not being considered,  I recommend that he consider spinal cord stimulator trial which I am happy to offer him.  He presents today to refill his oxycodone which he takes twice daily to 3 times daily as needed.  Pharmacotherapy Assessment   09/28/2020  07/18/2020   1  Oxycodone Hcl (Ir) 10 Mg Tab  75.00  25  Bi Lat  [id number]  Haw (1669)  0/0  45.00 MME  Comm Ins  [place]     Analgesic: Oxycodone 10 mg 3 times daily as needed, quantity 75/month; MME equals 45  Monitoring: [place] PMP: PDMP not reviewed this encounter.       Pharmacotherapy: No side-effects or adverse reactions reported. Compliance: No problems identified. Effectiveness: Clinically acceptable.  Dewayne Shorter, RN  10/21/2020 11:08 AM  Signed Nursing Pain Medication Assessment:  Safety precautions to be maintained throughout the outpatient stay will include: orient to surroundings, keep bed in low position, maintain call bell within reach at all  times, provide assistance with transfer out of bed and ambulation.  Medication Inspection Compliance: Mr. Eckrich did not comply with our request to bring his pills to be counted. He was reminded that bringing  the medication bottles, even when empty, is a requirement.  Medication: None brought in. Pill/Patch Count: None available to be counted. Bottle Appearance: No container available. Did not bring bottle(s) to appointment. Filled Date: N/A Last Medication intake:  Day before yesterday reminded to bring pills/bottles to appointments for count    UDS:  Summary  Date Value Ref Range Status  03/27/2020 Note  Final    Comment:    ==================================================================== [employer] Select 13 (MW) ==================================================================== Test                             Result       Flag       Units  Drug Present and Declared for Prescription Verification   Oxycodone                      1653         EXPECTED   ng/mg creat   Oxymorphone                    512          EXPECTED   ng/mg creat   Noroxycodone                   >3876        EXPECTED   ng/mg creat   Noroxymorphone                 387          EXPECTED   ng/mg creat    Sources of oxycodone are scheduled prescription medications.    Oxymorphone, noroxycodone, and noroxymorphone are expected    metabolites of oxycodone. Oxymorphone is also available as a    scheduled prescription medication.  ==================================================================== Test                      Result    Flag   Units      Ref Range   Creatinine              258              mg/dL      >=20 ==================================================================== Declared Medications:  The flagging and interpretation on this report are based on the  following declared medications.  Unexpected results may arise from  inaccuracies in the declared medications.   **Note: The testing scope of this panel includes these medications:   Oxycodone   **Note: The testing scope of this panel does not include the  following reported medications:   Albuterol (Ventolin HFA)  Cyanocobalamin   Hyoscyamine (Uribel)  Methenamine (Uribel)  Methylene Blue (Uribel)  Midodrine (Proamatine)  Omega-3 Fatty Acids  Phenyl salicylate (Uribel)  Probiotic  Sodium phosphate, monobasic (Uribel)  Tizanidine (Zanaflex)  Topical Diclofenac  Vitamin C  Vitamin D3  Zinc ==================================================================== For clinical consultation, please call [phone]. ====================================================================      ROS  Constitutional: Denies any fever or chills Gastrointestinal: No reported hemesis, hematochezia, vomiting, or acute GI distress Musculoskeletal: Low back, bilateral leg pain Neurological: No reported episodes of acute onset apraxia, aphasia, dysarthria, agnosia, amnesia, paralysis, loss of coordination, or loss of consciousness  Medication Review  DULoxetine, Diclofenac  Sodium, Oxycodone HCl, Zinc, albuterol, gabapentin, lisinopril, nicotine, silodosin, tiZANidine, and vitamin B-12  History Review  Allergy: Mr. Wilber has No Known Allergies. Drug: Mr. Grieshop  reports no history of drug use. Alcohol:  reports no history of alcohol use. Tobacco:  reports that he has been smoking cigarettes. He has a 60.00 pack-year smoking history. He uses smokeless tobacco. Social: Mr. Syler  reports that he has been smoking cigarettes. He has a 60.00 pack-year smoking history. He uses smokeless tobacco. He reports that he does not drink alcohol and does not use drugs. Medical:  has a past medical history of Allergy, Arthritis, Cancer ([place]), Chronic back pain, Current every day smoker, Diverticulosis, GERD (gastroesophageal reflux disease), H/O diverticulitis of colon, Restless leg syndrome, and Sciatic pain. Surgical: Mr. Adrian  has a past surgical history that includes Tonsillectomy and adenoidectomy; Total knee arthroplasty (Left, 10/14/2017); skin cancer removal; and Joint replacement (Left, 10/14/2018). Family: family  history includes Alzheimer's disease in his father; Cancer (age of onset: 69) in his sister; Diabetes in his sister; Healthy in his brother, son, son, son, and son; Heart disease in his brother; Hypertension in his mother; Prostate cancer (age of onset: 55) in his father.  Laboratory Chemistry Profile   Renal Lab Results  Component Value Date   BUN 13 04/18/2019   CREATININE 0.86 04/18/2019   GFR 68.66 04/02/2014   GFRAA >60 04/18/2019   GFRNONAA >60 04/18/2019     Hepatic Lab Results  Component Value Date   AST 13 (L) 04/18/2019   ALT 15 04/18/2019   ALBUMIN 4.6 04/18/2019   ALKPHOS 66 04/18/2019   LIPASE 44 03/22/2019     Electrolytes Lab Results  Component Value Date   NA 143 04/18/2019   K 3.7 04/18/2019   CL 108 04/18/2019   CALCIUM 9.6 04/18/2019     Bone No results found for: VD25OH, VD125OH2TOT, TF5732KG2, RK2706CB7, 25OHVITD1, 25OHVITD2, 25OHVITD3, TESTOFREE, TESTOSTERONE   Inflammation (CRP: Acute Phase) (ESR: Chronic Phase) Lab Results  Component Value Date   ESRSEDRATE 30 (H) 09/29/2017       Note: Above Lab results reviewed.  Physical Exam  General appearance: Well nourished, well developed, and well hydrated. In no apparent acute distress Mental status: Alert, oriented x 3 (person, place, & time)       Respiratory: No evidence of acute respiratory distress Eyes: PERLA Vitals: BP (!) 158/84   Pulse (!) 109   Temp 97.9 F (36.6 C)   Resp 18   Ht '6\' 1"'  (1.854 m)   Wt 220 lb (99.8 kg)   SpO2 97%   BMI 29.03 kg/m  BMI: Estimated body mass index is 29.03 kg/m as calculated from the following:   Height as of this encounter: '6\' 1"'  (1.854 m).   Weight as of this encounter: 220 lb (99.8 kg). Ideal: Ideal body weight: 79.9 kg (176 lb 2.4 oz) Adjusted ideal body weight: 87.9 kg (193 lb 11 oz)  Lumbar Spine Area Exam  Skin & Axial Inspection:No masses, redness, or swelling Alignment:Symmetrical Functional SEG:BTDV restricted lumbar  extension Stability:No instability detected Muscle Tone/Strength:Functionally intact. No obvious neuro-muscular anomalies detected. Sensory (Neurological):Dermatomal pain patternRIGHT L4/5 greater than left Palpation:No palpable anomalies Provocative Tests: Hyperextension/rotation test:deferred today Lumbar quadrant test (Kemp's test):(+)on the right for foraminal stenosis greater than left Lateral bending test:deferred today Patrick's Maneuver:deferred today FABER* test:deferred today S-I anterior distraction/compression test:deferred today S-I lateral compression test:deferred today S-I Thigh-thrust test:deferred today S-I Gaenslen's test:deferred today *(Flexion, ABduction and External Rotation) Gait &  Posture Assessment  Ambulation:Unassisted Gait:Relatively normal for age and body habitus Posture:WNL Lower Extremity Exam    Side:Right lower extremity  Side:Left lower extremity  Stability:No instability observed  Stability:No instability observed  Skin & Extremity Inspection:Skin color, temperature, and hair growth are WNL. No peripheral edema or cyanosis. No masses, redness, swelling, asymmetry, or associated skin lesions. No contractures.  Skin & Extremity Inspection:Skin color, temperature, and hair growth are WNL. No peripheral edema or cyanosis. No masses, redness, swelling, asymmetry, or associated skin lesions. No contractures.  Functional IRC:[id number] ROM   Functional CHE:NIDPOEUMPNTI ROM   Muscle Tone/Strength:Functionally intact. No obvious neuro-muscular anomalies detected.  Muscle Tone/Strength:Functionally intact. No obvious neuro-muscular anomalies detected.  Sensory (Neurological):Dermatomal pain pattern  Sensory (Neurological):Unimpaired  DTR: Patellar:deferred  today Achilles:deferred today Plantar:deferred today  DTR: Patellar:deferred today Achilles:deferred today Plantar:deferred today  Palpation:No palpable anomalies  Palpation:No palpable anomalies      Assessment   Status Diagnosis  Persistent Persistent Not responding 1. Lumbar radiculopathy   2. Lumbar foraminal stenosis   3. Chronic pain syndrome      Plan of Care  Mr. FINES KIMBERLIN has a current medication list which includes the following long-term medication(s): duloxetine and gabapentin.  1. Recommend acetaminophen 500 mg 4 times daily as needed 2.  Continue gabapentin 300 mg twice a day 3.  Continue Cymbalta 30 mg daily. 4.  Refill oxycodone as below.  No dose escalation beyond this.  UDS today for medication compliance monitoring. 5.  Consider spinal cord stimulator trial for persistent lower extremity neuropathic pain, chronic lumbar radicular pain refractory to physical therapy, medication management and epidural steroid injection. 6.  As needed lumbar epidural steroid injection at L4-L5  Pharmacotherapy (Medications Ordered): Meds ordered this encounter  Medications  . Oxycodone HCl 10 MG TABS    Sig: Take 1 tablet (10 mg total) by mouth 3 (three) times daily as needed.    Dispense:  75 tablet    Refill:  0    For chronic pain syndrome. Not to exceed 75 tablets/month  . Oxycodone HCl 10 MG TABS    Sig: Take 1 tablet (10 mg total) by mouth 3 (three) times daily as needed.    Dispense:  75 tablet    Refill:  0    For chronic pain syndrome. Not to exceed 75 tablets/month  . Oxycodone HCl 10 MG TABS    Sig: Take 1 tablet (10 mg total) by mouth 3 (three) times daily as needed.    Dispense:  75 tablet    Refill:  0    For chronic pain syndrome. Not to exceed 75 tablets/month   Orders:  Orders Placed This Encounter  Procedures  . Lumbar Epidural Injection    Standing Status:   Standing    Number of Occurrences:   9    Standing Expiration  Date:   10/21/2021    Scheduling Instructions:     Purpose: Palliative     Indication: Lower extremity pain/Sciatica unspecified side (M54.30).     Side: Midline     Level: TBD     Sedation: Patient's choice.     TIMEFRAME: PRN procedure. (Mr. Lovejoy will call when needed.)    Order Specific Question:   Where will this procedure be performed?    Answer:   [facility]  . [employer] Select 13 (MW), Urine    Volume: 30 ml(s). Minimum 3 ml of urine is needed. Document temperature of fresh sample. Indications: Long term (current) use of  opiate analgesic [phone])    Order Specific Question:   Release to patient    Answer:   Immediate   Follow-up plan:   Return in about 3 months (around 01/19/2021) for Medication Management, in person.    Recent Visits No visits were found meeting these conditions. Showing recent visits within past 90 days and meeting all other requirements Today's Visits Date Type Provider Dept  10/21/20 Office Visit [name], MD [facility]  Showing today's visits and meeting all other requirements Future Appointments Date Type Provider Dept  01/16/21 Appointment [name], MD [facility]  Showing future appointments within next 90 days and meeting all other requirements  I discussed the assessment and treatment plan with the patient. The patient was provided an opportunity to ask questions and all were answered. The patient agreed with the plan and demonstrated an understanding of the instructions.  Patient advised to call back or seek an in-person evaluation if the symptoms or condition worsens.  Duration of encounter: 30 minutes.  Note by: [name], MD Date: 10/21/2020; Time: 12:34 PM

## 2020-10-21 NOTE — Progress Notes (Signed)
Nursing Pain Medication Assessment:  Safety precautions to be maintained throughout the outpatient stay will include: orient to surroundings, keep bed in low position, maintain call bell within reach at all times, provide assistance with transfer out of bed and ambulation.  Medication Inspection Compliance: Anthony Nguyen did not comply with our request to bring his pills to be counted. He was reminded that bringing the medication bottles, even when empty, is a requirement.  Medication: None brought in. Pill/Patch Count: None available to be counted. Bottle Appearance: No container available. Did not bring bottle(s) to appointment. Filled Date: N/A Last Medication intake:  Day before yesterday reminded to bring pills/bottles to appointments for count

## 2020-10-28 ENCOUNTER — Encounter: Payer: Self-pay | Admitting: Student in an Organized Health Care Education/Training Program

## 2020-10-28 ENCOUNTER — Telehealth: Payer: Self-pay | Admitting: Student in an Organized Health Care Education/Training Program

## 2020-10-28 MED ORDER — OXYCODONE HCL 10 MG PO TABS: 10.0000 mg | ORAL_TABLET | Freq: Three times a day (TID) | ORAL | 0 refills | Status: AC | PRN

## 2020-10-28 MED ORDER — OXYCODONE HCL 10 MG PO TABS: 10.0000 mg | ORAL_TABLET | Freq: Three times a day (TID) | ORAL | 0 refills | Status: DC | PRN

## 2020-10-28 NOTE — Addendum Note (Signed)
Addended by: [name] on: 10/28/2020 10:07 AM   Modules accepted: Orders

## 2020-10-28 NOTE — Telephone Encounter (Signed)
Called pharmacy and they did not received the prescription for Oxycodone 10mg  TID. I looked in the chart and it states that transmission failed. Can you please resend his prescriptions? I called patient and informed him of what was happening.

## 2020-10-28 NOTE — Telephone Encounter (Signed)
Patient states he called [facility] to get meds filled and they say they do not have any scripts to fill. Please check with pharmacy and let patient know status.

## 2020-10-29 LAB — TOXASSURE SELECT 13 (MW), URINE

## 2020-12-04 DIAGNOSIS — J4520 Mild intermittent asthma, uncomplicated: Secondary | ICD-10-CM | POA: Diagnosis present

## 2020-12-17 IMAGING — CR LEFT TIBIA AND FIBULA - 2 VIEW
3 series · 3 of 3 positions shown · non-contrast
Comparison: CT 09/16/2017, radiograph 10/14/2017

CLINICAL DATA: Fell off ladder

EXAM:
LEFT TIBIA AND FIBULA - 2 VIEW

[tibia ap (1 of 2)]
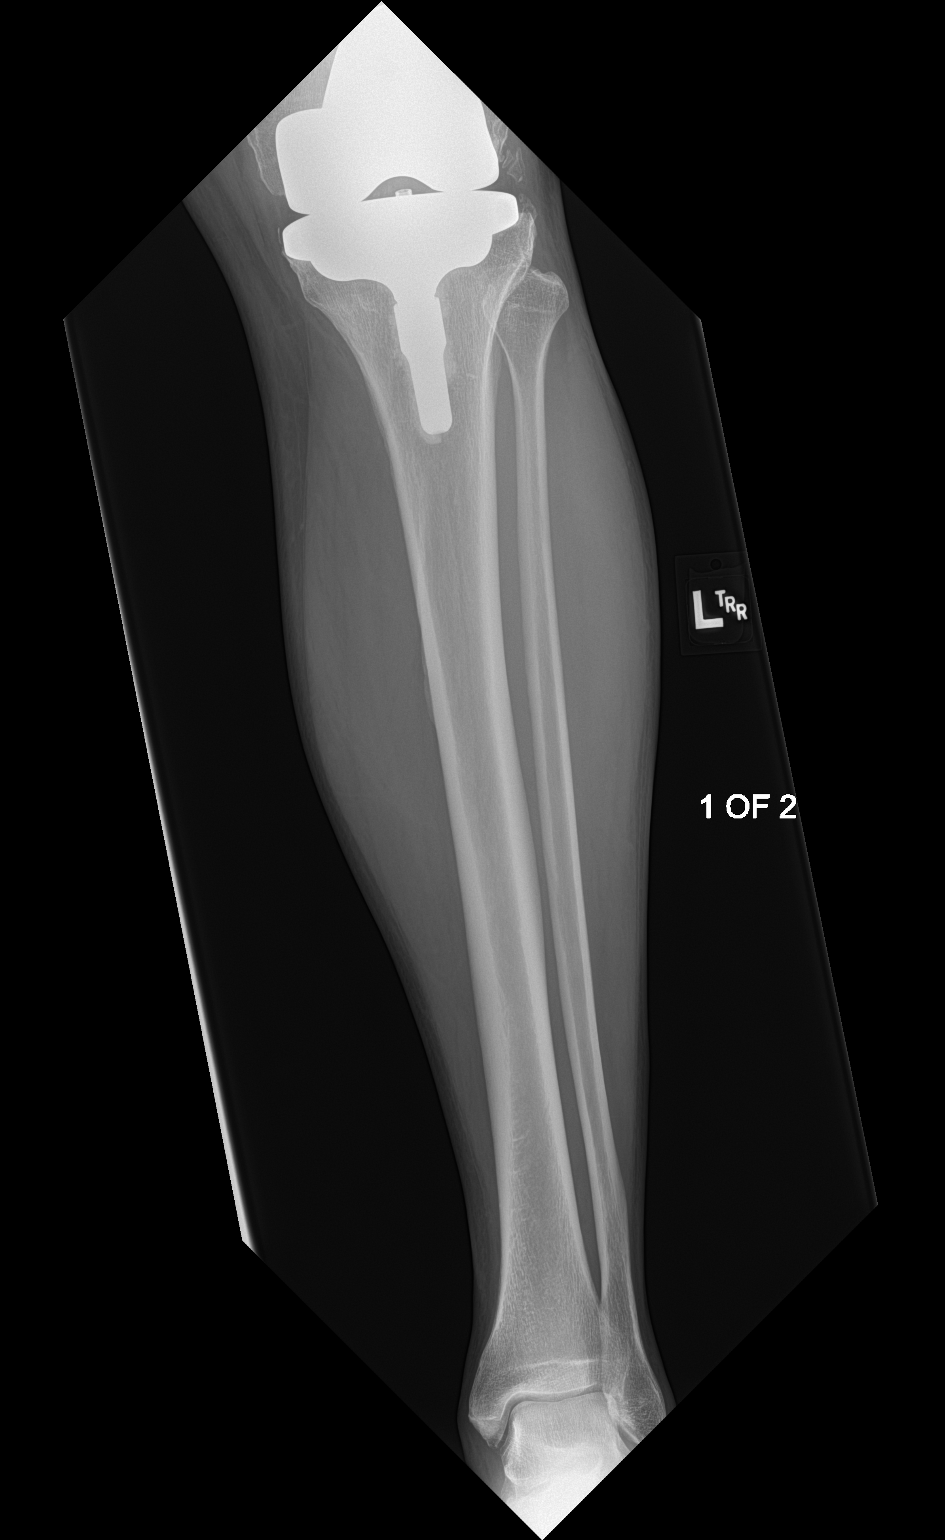

[tibia ap (2 of 2)]
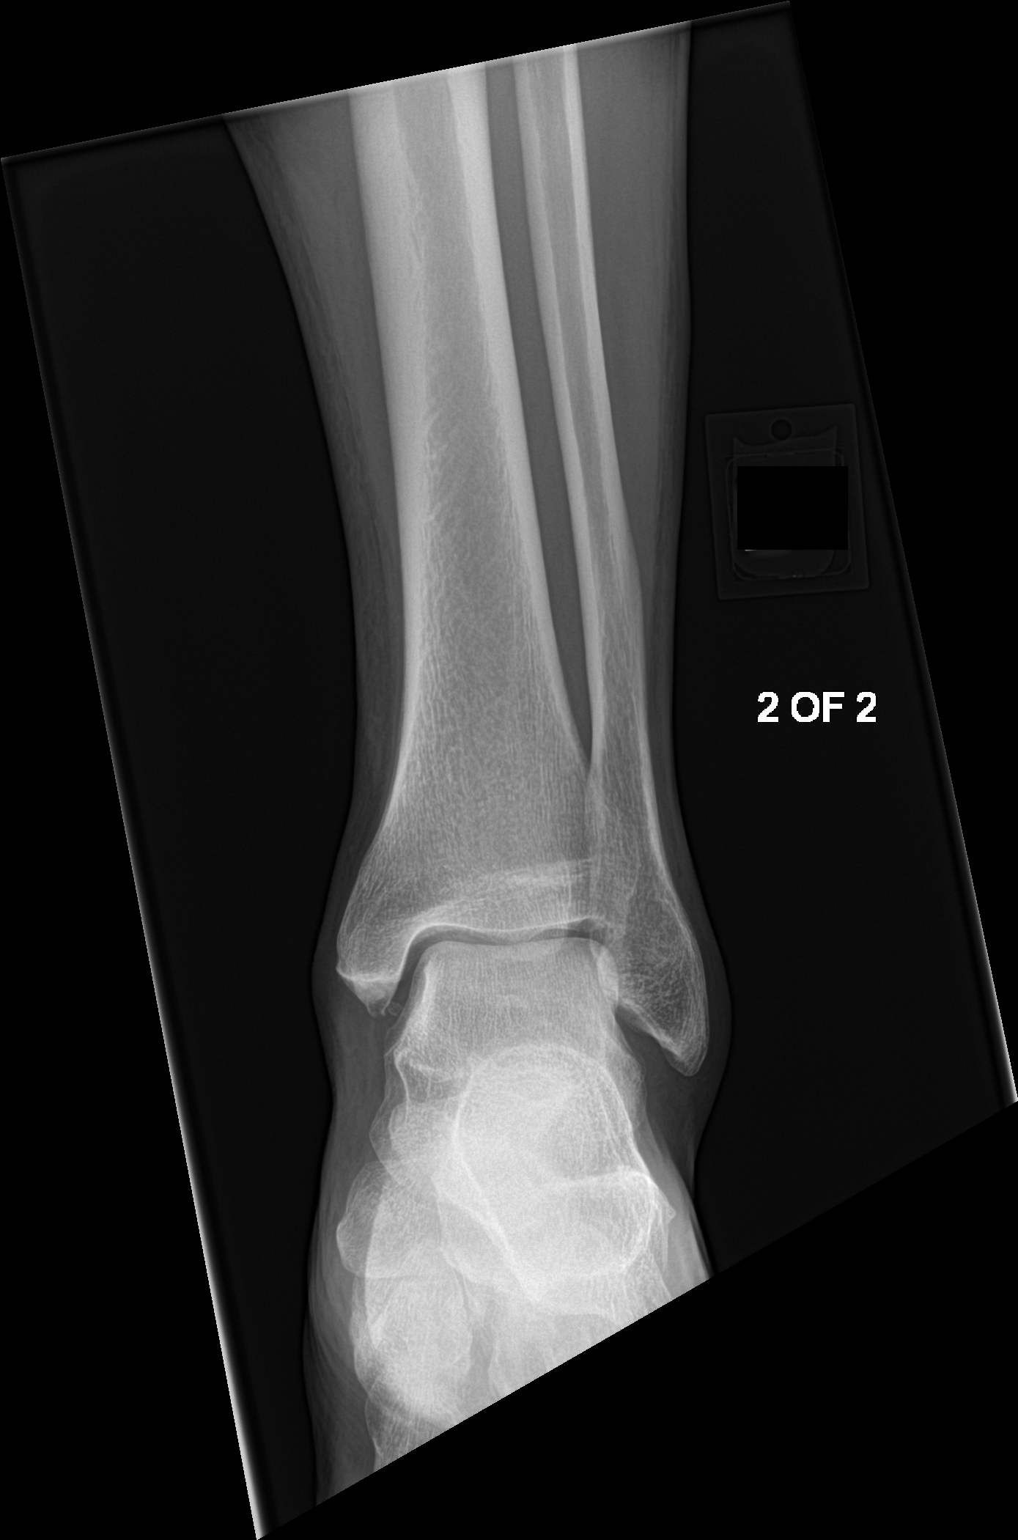

[tibia lat]
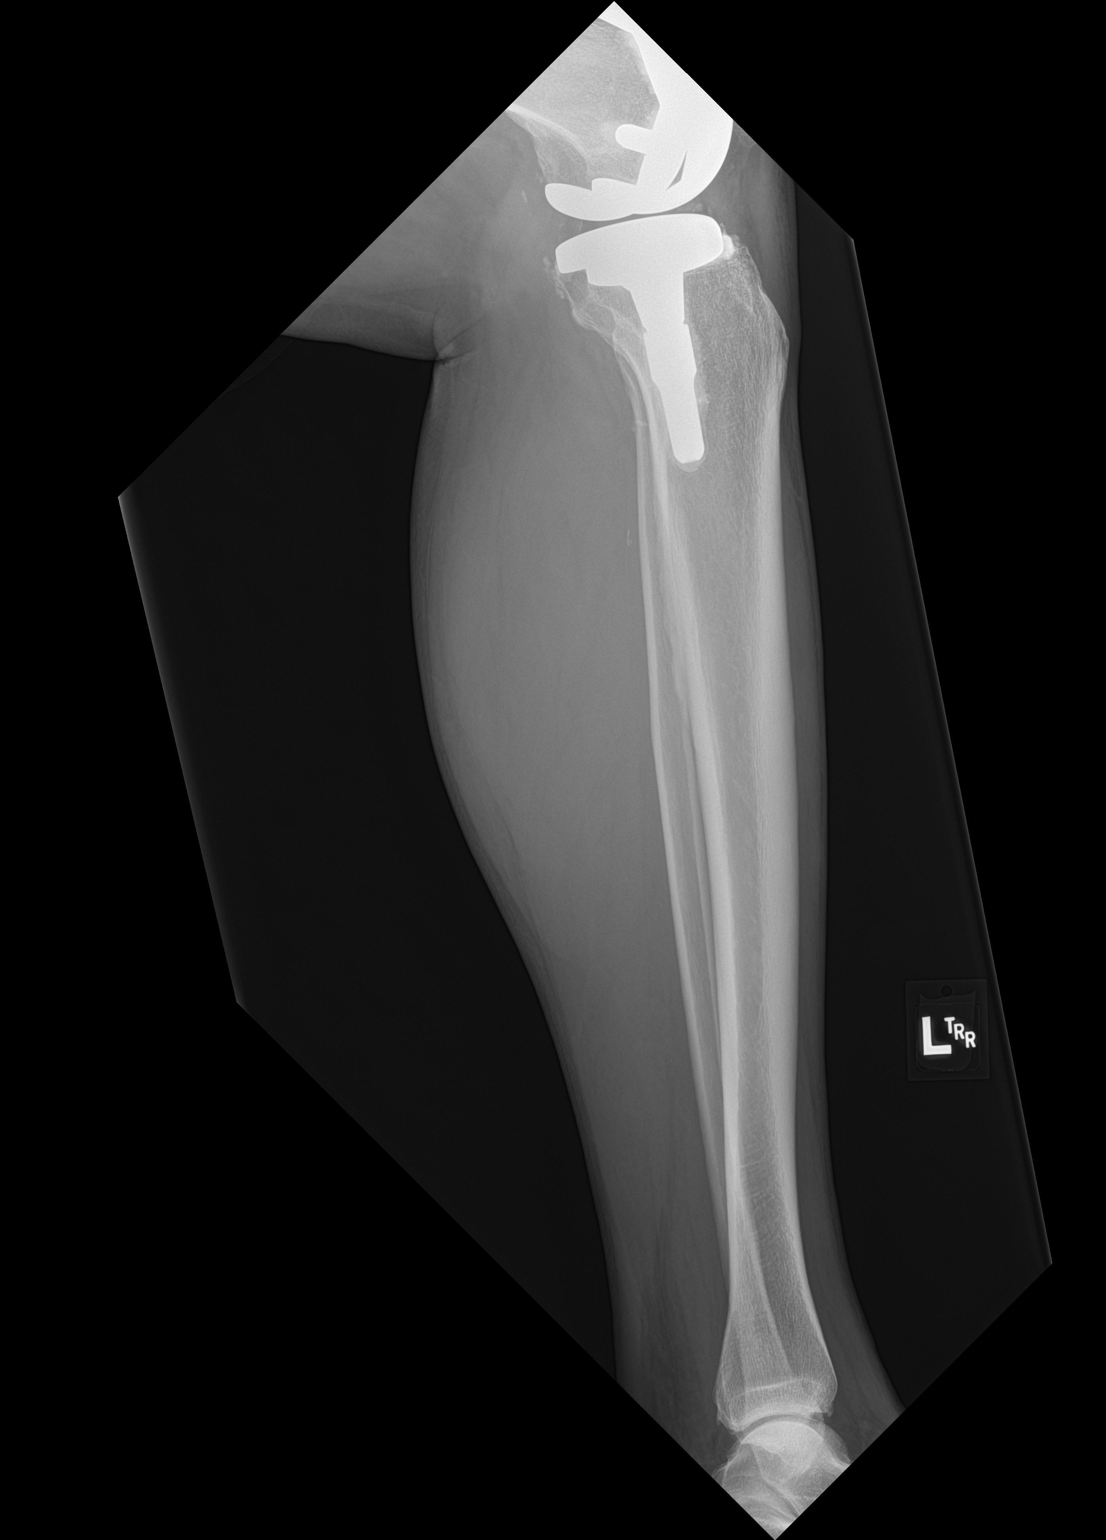

[3 of 3 positions shown; findings below may reference images not displayed]

FINDINGS: Prior left knee replacement with grossly intact hardware. No acute
displaced fracture or malalignment. [place] versus small loose body
at the medial ankle joint.
IMPRESSION: Prior knee replacement.  No acute osseous abnormality.

## 2020-12-30 ENCOUNTER — Ambulatory Visit
Admission: RE | Admit: 2020-12-30 | Discharge: 2020-12-30 | Disposition: A | Discharge status: Home / Self Care | Payer: Self-pay | Source: Ambulatory Visit | Attending: Student in an Organized Health Care Education/Training Program | Admitting: Student in an Organized Health Care Education/Training Program

## 2020-12-30 ENCOUNTER — Other Ambulatory Visit: Payer: Self-pay

## 2020-12-30 ENCOUNTER — Ambulatory Visit (HOSPITAL_BASED_OUTPATIENT_CLINIC_OR_DEPARTMENT_OTHER): Payer: Self-pay | Admitting: Student in an Organized Health Care Education/Training Program

## 2020-12-30 ENCOUNTER — Encounter: Payer: Self-pay | Admitting: Student in an Organized Health Care Education/Training Program

## 2020-12-30 VITALS — BP 136/90 | HR 75 | Temp 97.1°F | Resp 16 | Ht 73.0 in | Wt 226.0 lb

## 2020-12-30 DIAGNOSIS — M5416 Radiculopathy, lumbar region: Secondary | ICD-10-CM

## 2020-12-30 DIAGNOSIS — G894 Chronic pain syndrome: Secondary | ICD-10-CM

## 2020-12-30 DIAGNOSIS — M48061 Spinal stenosis, lumbar region without neurogenic claudication: Secondary | ICD-10-CM

## 2020-12-30 MED ORDER — SODIUM CHLORIDE (PF) 0.9 % IJ SOLN
INTRAMUSCULAR | Status: AC
  Filled 2020-12-30: qty 10

## 2020-12-30 MED ORDER — LIDOCAINE HCL 2 % IJ SOLN
INTRAMUSCULAR | Status: AC
  Filled 2020-12-30: qty 20

## 2020-12-30 MED ORDER — IOHEXOL 180 MG/ML  SOLN
10.0000 mL | Freq: Once | INTRAMUSCULAR | Status: AC
  Administered 2020-12-30: 10 mL via EPIDURAL

## 2020-12-30 MED ORDER — SODIUM CHLORIDE 0.9% FLUSH
2.0000 mL | Freq: Once | INTRAVENOUS | Status: AC
  Administered 2020-12-30: 2 mL

## 2020-12-30 MED ORDER — LIDOCAINE HCL 2 % IJ SOLN
20.0000 mL | Freq: Once | INTRAMUSCULAR | Status: AC
  Administered 2020-12-30: 400 mg

## 2020-12-30 MED ORDER — ROPIVACAINE HCL 2 MG/ML IJ SOLN
INTRAMUSCULAR | Status: AC
  Filled 2020-12-30: qty 10

## 2020-12-30 MED ORDER — DEXAMETHASONE SODIUM PHOSPHATE 10 MG/ML IJ SOLN
INTRAMUSCULAR | Status: AC
  Filled 2020-12-30: qty 1

## 2020-12-30 MED ORDER — DEXAMETHASONE SODIUM PHOSPHATE 10 MG/ML IJ SOLN
10.0000 mg | Freq: Once | INTRAMUSCULAR | Status: AC
  Administered 2020-12-30: 10 mg

## 2020-12-30 MED ORDER — ROPIVACAINE HCL 2 MG/ML IJ SOLN
2.0000 mL | Freq: Once | INTRAMUSCULAR | Status: AC
  Administered 2020-12-30: 2 mL via EPIDURAL

## 2020-12-30 NOTE — Progress Notes (Signed)
Patient's Name: Anthony Nguyen  MRN: [id number]  Referring Provider: Nelle Don, MD  DOB: 10-01-1956  PCP: Nelle Don, MD  DOS: 12/30/2020  Note by: [name], MD  Service setting: Ambulatory outpatient  Specialty: Interventional Pain Management  Patient type: Established  Location: [facility] (AMB) Pain Management Facility  Visit type: Interventional Procedure   Primary Reason for Visit: Interventional Pain Management Treatment. CC: Back Pain (Lumbar bilateral), Leg Pain (Left leg, s/p joint replacement ), and Foot Pain (Bilateral )  Procedure:       Anesthesia, Analgesia, Anxiolysis:  Type: Therapeutic Inter-Laminar Epidural Steroid Injection #1 (in 2022) Region: Lumbar Level: L4-5 Level. Laterality: Left-Sided         Type: Local Anesthesia Indication(s): Analgesia and Anxiety Route: Infiltration ([place]/IM) IV Access: Declined Sedation: Declined  Local Anesthetic: Lidocaine 1%   Indications: 1. Lumbar radiculopathy   2. Lumbar foraminal stenosis   3. Chronic pain syndrome    Pain Score: Pre-procedure: 10-Worst pain ever/10 Post-procedure: 0-No pain (standing)/10  Pre-op Assessment:  Anthony Nguyen is a 65 y.o. (year old), male patient, seen today for interventional treatment. 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 Anthony Nguyen has a current medication list which includes the following prescription(s): albuterol, atorvastatin, budesonide-formoterol, buspirone, diclofenac sodium, duloxetine, gabapentin, lisinopril, nicotine, oxycodone hcl, silodosin, gabapentin, tizanidine, vitamin b-12, and zinc. His primarily concern today is the Back Pain (Lumbar bilateral), Leg Pain (Left leg, s/p joint replacement ), and Foot Pain (Bilateral )  Initial Vital Signs:  Pulse Rate: 87 Temp: (!) 97.1 F (36.2 C) Resp: 16 BP: 132/79 SpO2: 96  %  BMI: Estimated body mass index is 29.82 kg/m as calculated from the following:   Height as of this encounter: 6\' 1"  (1.854 m).   Weight as of this encounter: 226 lb (102.5 kg).  Risk Assessment: Allergies: Reviewed. He is allergic to shellfish allergy.  Allergy Precautions: None required Coagulopathies: Reviewed. None identified.  Blood-thinner therapy: None at this time Active Infection(s): Reviewed. None identified. Anthony Nguyen is afebrile  Site Confirmation: Anthony Nguyen was asked to confirm the procedure and laterality before marking the site Procedure checklist: Completed Consent: Before the procedure and under the influence of no sedative(s), amnesic(s), or anxiolytics, the patient was informed of the treatment options, risks and possible complications. To fulfill our ethical and legal obligations, as recommended by the American Medical Association's Code of Ethics, I have informed the patient of my clinical impression; the nature and purpose of the treatment or procedure; the risks, benefits, and possible complications of the intervention; the alternatives, including doing nothing; the risk(s) and benefit(s) of the alternative treatment(s) or procedure(s); and the risk(s) and benefit(s) of doing nothing. The patient was provided information about the general risks and possible complications associated with the procedure. These may include, but are not limited to: failure to achieve desired goals, infection, bleeding, organ or nerve damage, allergic reactions, paralysis, and death. In addition, the patient was informed of those risks and complications associated to Spine-related procedures, such as failure to decrease pain; infection (i.e.: Meningitis, epidural or intraspinal abscess); bleeding (i.e.: epidural hematoma, subarachnoid hemorrhage, or any other type of intraspinal or peri-dural bleeding); organ or nerve damage (i.e.: Any type of peripheral nerve, nerve root, or spinal  cord injury) with subsequent damage to sensory, motor, and/or autonomic systems, resulting in permanent pain, numbness, and/or weakness of one or several areas  of the body; allergic reactions; (i.e.: anaphylactic reaction); and/or death. Furthermore, the patient was informed of those risks and complications associated with the medications. These include, but are not limited to: allergic reactions (i.e.: anaphylactic or anaphylactoid reaction(s)); adrenal axis suppression; blood sugar elevation that in diabetics may result in ketoacidosis or comma; water retention that in patients with history of congestive heart failure may result in shortness of breath, pulmonary edema, and decompensation with resultant heart failure; weight gain; swelling or edema; medication-induced neural toxicity; particulate matter embolism and blood vessel occlusion with resultant organ, and/or nervous system infarction; and/or aseptic necrosis of one or more joints. Finally, the patient was informed that Medicine is not an exact science; therefore, there is also the possibility of unforeseen or unpredictable risks and/or possible complications that may result in a catastrophic outcome. The patient indicated having understood very clearly. We have given the patient no guarantees and we have made no promises. Enough time was given to the patient to ask questions, all of which were answered to the patient's satisfaction. Anthony Nguyen has indicated that he wanted to continue with the procedure. Attestation: I, the ordering provider, attest that I have discussed with the patient the benefits, risks, side-effects, alternatives, likelihood of achieving goals, and potential problems during recovery for the procedure that I have provided informed consent. Date  Time:   Pre-Procedure Preparation:  Monitoring: As per clinic protocol. Respiration, ETCO2, SpO2, BP, heart rate and rhythm monitor placed and checked for adequate function Safety  Precautions: Patient was assessed for positional comfort and pressure points before starting the procedure. Time-out: I initiated and conducted the "Time-out" before starting the procedure, as per protocol. The patient was asked to participate by confirming the accuracy of the "Time Out" information. Verification of the correct person, site, and procedure were performed and confirmed by me, the nursing staff, and the patient. "Time-out" conducted as per Joint Commission's Universal Protocol (UP.01.01.01). Time: 1153  Description of Procedure:       Position: Prone with head of the table was raised to facilitate breathing. Target Area: The interlaminar space, initially targeting the lower laminar border of the superior vertebral body. Approach: Paramedial approach. Area Prepped: Entire Posterior Lumbar Region Prepping solution: ChloraPrep (2% chlorhexidine gluconate and 70% isopropyl alcohol) Safety Precautions: Aspiration looking for blood return was conducted prior to all injections. At no point did we inject any substances, as a needle was being advanced. No attempts were made at seeking any paresthesias. Safe injection practices and needle disposal techniques used. Medications properly checked for expiration dates. SDV (single dose vial) medications used. Description of the Procedure: Protocol guidelines were followed. The procedure needle was introduced through the skin, ipsilateral to the reported pain, and advanced to the target area. Bone was contacted and the needle walked caudad, until the lamina was cleared. The epidural space was identified using "loss-of-resistance technique" with 2-3 ml of PF-NaCl (0.9% NSS), in a 5cc LOR glass syringe. Vitals:   12/30/20 1102 12/30/20 1144 12/30/20 1154 12/30/20 1200  BP: 132/79 (!) 153/89 (!) 137/91 136/90  Pulse: 87 93 75 75  Resp: 16 16 16 16   Temp: (!) 97.1 F (36.2 C)     TempSrc: Temporal     SpO2: 96% 97% 96% 97%  Weight: 226 lb (102.5 kg)      Height: 6\' 1"  (1.854 m)       Start Time: 1153 hrs. End Time: 1157 hrs. Materials:  Needle(s) Type: Epidural needle Gauge: 22G Length: 3.5-in Medication(s): Please  see orders for medications and dosing details. [place] solution made of 3 cc of preservative-free saline, 3 cc of 0.2% ropivacaine, 1 cc of Decadron 10 mg/cc Imaging Guidance (Spinal):  Type of Imaging Technique: Fluoroscopy Guidance (Spinal) Indication(s): Assistance in needle guidance and placement for procedures requiring needle placement in or near specific anatomical locations not easily accessible without such assistance. Exposure Time: Please see nurses notes. Contrast: Before injecting any contrast, we confirmed that the patient did not have an allergy to iodine, shellfish, or radiological contrast. Once satisfactory needle placement was completed at the desired level, radiological contrast was injected. Contrast injected under live fluoroscopy. No contrast complications. See chart for type and volume of contrast used. Fluoroscopic Guidance: I was personally present during the use of fluoroscopy. "Tunnel Vision Technique" used to obtain the best possible view of the target area. Parallax error corrected before commencing the procedure. "Direction-depth-direction" technique used to introduce the needle under continuous pulsed fluoroscopy. Once target was reached, antero-posterior, oblique, and lateral fluoroscopic projection used confirm needle placement in all planes. Images permanently stored in EMR. Interpretation: I personally interpreted the imaging intraoperatively. Adequate needle placement confirmed in multiple planes. Appropriate spread of contrast into desired area was observed. No evidence of afferent or efferent intravascular uptake. No intrathecal or subarachnoid spread observed. Permanent images saved into the patient's record.   Post-operative Assessment:  Post-procedure Vital Signs:  Pulse Rate: 75 (nsr) Temp:  (!) 97.1 F (36.2 C) Resp: 16 BP: 136/90 SpO2: 97 %  EBL: None  Complications: No immediate post-treatment complications observed by team, or reported by patient.  Note: The patient tolerated the entire procedure well. A repeat set of vitals were taken after the procedure and the patient was kept under observation following institutional policy, for this type of procedure. Post-procedural neurological assessment was performed, showing return to baseline, prior to discharge. The patient was provided with post-procedure discharge instructions, including a section on how to identify potential problems. Should any problems arise concerning this procedure, the patient was given instructions to immediately contact us, at any time, without hesitation. In any case, we plan to contact the patient by telephone for a follow-up status report regarding this interventional procedure.  Comments:  No additional relevant information. 5 out of 5 strength bilateral lower extremity: Plantar flexion, dorsiflexion, knee flexion, knee extension.  Plan of Care    Imaging Orders     DG PAIN CLINIC C-ARM 1-60 MIN NO REPORT  Medications ordered for procedure: Meds ordered this encounter  Medications  . iohexol (OMNIPAQUE) 180 MG/ML injection 10 mL    Must be Myelogram-compatible. If not available, you may substitute with a water-soluble, non-ionic, hypoallergenic, myelogram-compatible radiological contrast medium.  [place] Kitchen lidocaine (XYLOCAINE) 2 % (with pres) injection 400 mg  . ropivacaine (PF) 2 mg/mL (0.2%) (NAROPIN) injection 2 mL  . sodium chloride flush (NS) 0.9 % injection 2 mL  . dexamethasone (DECADRON) injection 10 mg   Orders Placed This Encounter  Procedures  . DG PAIN CLINIC C-ARM 1-60 MIN NO REPORT    Intraoperative interpretation by procedural physician at [place].    Standing Status:   Standing    Number of Occurrences:   1    Order Specific Question:   Reason for exam:     Answer:   Assistance in needle guidance and placement for procedures requiring needle placement in or near specific anatomical locations not easily accessible without such assistance.     Medications administered: We administered iohexol, lidocaine, ropivacaine (PF)  2 mg/mL (0.2%), sodium chloride flush, and dexamethasone.  See the medical record for exact dosing, route, and time of administration.  Disposition: Discharge home  Discharge Date & Time: 12/30/2020; 1203 hrs.   Physician-requested Follow-up: Return for Keep sch. appt.  Future Appointments  Date Time Provider [place]  01/16/2021 11:20 AM [name], MD [facility] None   Primary Care Physician: Nelle Don, MD Location: [facility] Out[facility] Pain Management Facility Note by: [name], MD Date: 12/30/2020; Time: 1:15 PM  Disclaimer:  Medicine is not an exact science. The only guarantee in medicine is that nothing is guaranteed. It is important to note that the decision to proceed with this intervention was based on the information collected from the patient. The Data and conclusions were drawn from the patient's questionnaire, the interview, and the physical examination. Because the information was provided in large part by the patient, it cannot be guaranteed that it has not been purposely or unconsciously manipulated. Every effort has been made to obtain as much relevant data as possible for this evaluation. It is important to note that the conclusions that lead to this procedure are derived in large part from the available data. Always take into account that the treatment will also be dependent on availability of resources and existing treatment guidelines, considered by other Pain Management Practitioners as being common knowledge and practice, at the time of the intervention. For Medico-Legal purposes, it is also important to point out that variation in procedural techniques and pharmacological choices are the  acceptable norm. The indications, contraindications, technique, and results of the above procedure should only be interpreted and judged by a Board-Certified Interventional Pain Specialist with extensive familiarity and expertise in the same exact procedure and technique.

## 2020-12-30 NOTE — Progress Notes (Signed)
Safety precautions to be maintained throughout the outpatient stay will include: orient to surroundings, keep bed in low position, maintain call bell within reach at all times, provide assistance with transfer out of bed and ambulation.  

## 2020-12-31 ENCOUNTER — Telehealth: Payer: Self-pay

## 2020-12-31 NOTE — Telephone Encounter (Signed)
Post procedure phone call. Patient states he is doing well.  

## 2021-01-14 ENCOUNTER — Telehealth: Payer: Self-pay

## 2021-01-14 DIAGNOSIS — G5631 Lesion of radial nerve, right upper limb: Secondary | ICD-10-CM

## 2021-01-14 MED ORDER — TIZANIDINE HCL 4 MG PO TABS: 4.0000 mg | ORAL_TABLET | Freq: Three times a day (TID) | ORAL | 0 refills | Status: DC | PRN

## 2021-01-14 NOTE — Telephone Encounter (Signed)
This is concerning as he is overutilizing his medications by running out almost 12 days early.  This is happened in the past and I have reviewed our pain policy with the patient.  I will resend a new prescription for tizanidine that he can utilize for withdrawals.

## 2021-01-14 NOTE — Telephone Encounter (Signed)
The patient just called to say he has been out of medicine since Friday and his appt is Thursday. He states he is ( [place]) What do you want me to tell him?

## 2021-01-14 NOTE — Telephone Encounter (Signed)
Patient notified of message from Dr Holley Raring regarding Tizanidine for withdrawals.  Patient states understanding.

## 2021-01-14 NOTE — Telephone Encounter (Signed)
Dr Holley Raring,  I spoke with patient.  He ran out of his oxycodone on Friday.  The prescription should have lasted him until 01-26-2021.  He states he has been taking 3-4 per day and is now having "DT's" I instructed him that he is to have taken the medications as ordered but he states he was in so much pain.  States he is ready for the stimulator. He wanted to know if there was anything you could do for his withdrawals?

## 2021-01-16 ENCOUNTER — Other Ambulatory Visit: Payer: Self-pay

## 2021-01-16 ENCOUNTER — Ambulatory Visit
Payer: Self-pay | Attending: Student in an Organized Health Care Education/Training Program | Admitting: Student in an Organized Health Care Education/Training Program

## 2021-01-16 ENCOUNTER — Encounter: Payer: Self-pay | Admitting: Student in an Organized Health Care Education/Training Program

## 2021-01-16 ENCOUNTER — Telehealth: Payer: Self-pay

## 2021-01-16 VITALS — BP 155/85 | HR 115 | Temp 96.3°F | Resp 20 | Ht 73.0 in | Wt 223.0 lb

## 2021-01-16 DIAGNOSIS — G894 Chronic pain syndrome: Secondary | ICD-10-CM

## 2021-01-16 DIAGNOSIS — M48061 Spinal stenosis, lumbar region without neurogenic claudication: Secondary | ICD-10-CM

## 2021-01-16 DIAGNOSIS — M5416 Radiculopathy, lumbar region: Secondary | ICD-10-CM

## 2021-01-16 MED ORDER — OXYCODONE HCL 10 MG PO TABS: 10.0000 mg | ORAL_TABLET | Freq: Three times a day (TID) | ORAL | 0 refills | Status: AC | PRN

## 2021-01-16 NOTE — Progress Notes (Signed)
Nursing Pain Medication Assessment:  Safety precautions to be maintained throughout the outpatient stay will include: orient to surroundings, keep bed in low position, maintain call bell within reach at all times, provide assistance with transfer out of bed and ambulation.  Medication Inspection Compliance: Pill count conducted under aseptic conditions, in front of the patient. Neither the pills nor the bottle was removed from the patient's sight at any time. Once count was completed pills were immediately returned to the patient in their original bottle.  Medication: Oxycodone IR Pill/Patch Count: 0 of 75 pills remain Pill/Patch Appearance: Markings consistent with prescribed medication Bottle Appearance: Standard pharmacy container. Clearly labeled. Filled Date: 03/ 11 / 2022 Last Medication intake:  Ran out of medicine more than 48 hours ago

## 2021-01-16 NOTE — Progress Notes (Signed)
PROVIDER NOTE: Information contained herein reflects review and annotations entered in association with encounter. Interpretation of such information and data should be left to medically-trained personnel. Information provided to patient can be located elsewhere in the medical record under "Patient Instructions". Document created using STT-dictation technology, any transcriptional errors that may result from process are unintentional.    Patient: Anthony Nguyen  Service Category: E/M  Provider: [name], MD  DOB: 07-18-1956  DOS: 01/16/2021  Specialty: Interventional Pain Management  MRN: [id number]  Setting: Ambulatory outpatient  PCP: Nelle Don, MD  Type: Established Patient    Referring Provider: Nelle Don, MD  Location: Office  Delivery: Face-to-face     HPI  Anthony Nguyen, a 65 y.o. year old male, is here today because of his Lumbar radiculopathy [M54.16]. Anthony Nguyen's primary complain today is Back Pain (low) Last encounter: My last encounter with him was on 12/30/2020. Pertinent problems: Anthony Nguyen has Primary osteoarthritis of left knee; Lumbar radiculopathy; Lumbar degenerative disc disease; Lumbar foraminal stenosis; History of left knee replacement; Primary osteoarthritis of both knees; and Chronic pain syndrome on their pertinent problem list. Pain Assessment: Severity of Chronic pain is reported as a 7 /10. Location: Back Lower/radiates down both legs, greateer on the right to calf. Onset: More than a month ago. Quality: Burning,Pressure. Timing: Constant. Modifying factor(s): procedure helped some. Vitals:  height is _0  (1.854 m) and weight is 223 lb (101.2 kg). His temperature is 96.3 F (35.7 C) (abnormal). His blood pressure is 155/85 (abnormal) and his pulse is 115 (abnormal). His respiration is 20 and oxygen saturation is 96%.   Reason for encounter: medication management.    Patient is having increased low back pain with radiation to  bilateral legs, right greater than left.  He presents today for medication refill.  He has overutilize his medication.  We had a long discussion about medication compliance and taking his medications as prescribed.  Patient states that he has been in increased pain and that is the reason that he took more of his medication than prescribed.  I informed the patient that this is his warning.  He states that he would like to pursue spinal cord stimulator trial which she has read about and we have discussed this in the past as well.  His thoracic and lumbar MRI which were done less than a year ago do not show anything that would preclude a safe percutaneous trial or implant.  Patient will need a psychological assessment.  I will place an order for that as below.  He will follow up with me in 8 weeks for medication management and to hopefully answer any questions about spinal cord stimulation before we plan on scheduling.  Pharmacotherapy Assessment   12/27/2020  10/28/2020   1  Oxycodone Hcl (Ir) 10 Mg Tab  75.00  25  Bi Lat  [id number]  [place] ([id number])  0/0  45.00 MME  Comm Ins  [place]      Analgesic: Oxycodone 5 mg every 8 hours as needed, quantity 75/month; MME equals 45   Monitoring: [place] PMP: PDMP reviewed during this encounter.       Pharmacotherapy: No side-effects or adverse reactions reported. Compliance: No problems identified. Effectiveness: Clinically acceptable.  Dewayne Shorter, RN  01/16/2021 11:14 AM  Signed Nursing Pain Medication Assessment:  Safety precautions to be maintained throughout the outpatient stay will include: orient to surroundings, keep bed in low position, maintain call bell within reach at all  times, provide assistance with transfer out of bed and ambulation.  Medication Inspection Compliance: Pill count conducted under aseptic conditions, in front of the patient. Neither the pills nor the bottle was removed from the patient's sight at any time. Once count was completed pills were  immediately returned to the patient in their original bottle.  Medication: Oxycodone IR Pill/Patch Count: 0 of 75 pills remain Pill/Patch Appearance: Markings consistent with prescribed medication Bottle Appearance: Standard pharmacy container. Clearly labeled. Filled Date: 03/ 11 / 2022 Last Medication intake:  Ran out of medicine more than 48 hours ago    UDS:  Summary  Date Value Ref Range Status  10/21/2020 Note  Final    Comment:    ==================================================================== [employer] Select 13 (MW) ==================================================================== Test                             Result       Flag       Units  Drug Present and Declared for Prescription Verification   Noroxycodone                   286          EXPECTED   ng/mg creat   Noroxymorphone                 37           EXPECTED   ng/mg creat    Noroxycodone and noroxymorphone are expected metabolites of    oxycodone. Noroxymorphone is an expected metabolite of oxymorphone.    Sources of oxycodone and/or oxymorphone are scheduled prescription    medications.  Drug Absent but Declared for Prescription Verification   Oxycodone                      Not Detected UNEXPECTED ng/mg creat    Oxycodone is almost always present in patients taking this drug    consistently.  Absence of oxycodone could be due to lapse of time    since the last dose or unusual pharmacokinetics (rapid metabolism).  ==================================================================== Test                      Result    Flag   Units      Ref Range   Creatinine              354              mg/dL      >=20 ==================================================================== Declared Medications:  The flagging and interpretation on this report are based on the  following declared medications.  Unexpected results may arise from  inaccuracies in the declared medications.   **Note: The testing scope of  this panel includes these medications:   Oxycodone   **Note: The testing scope of this panel does not include the  following reported medications:   Albuterol (Ventolin HFA)  Cyanocobalamin  Diclofenac  Duloxetine (Cymbalta)  Gabapentin (Neurontin)  Lisinopril (Zestril)  Nicotine  Silodosin (Rapaflo)  Tizanidine (Zanaflex)  Zinc ==================================================================== For clinical consultation, please call [phone]. ====================================================================      ROS  Constitutional: Denies any fever or chills Gastrointestinal: No reported hemesis, hematochezia, vomiting, or acute GI distress Musculoskeletal: +LBP with radiation to bilateral hips and down right posterior lateral thigh Neurological: No reported episodes of acute onset apraxia, aphasia, dysarthria, agnosia, amnesia, paralysis, loss of coordination, or loss of consciousness  Medication Review  DULoxetine, Diclofenac Sodium, Oxycodone HCl, albuterol, atorvastatin, budesonide-formoterol, gabapentin, lisinopril, nicotine, silodosin, and tiZANidine  History Review  Allergy: Mr. Wehrly is allergic to shellfish allergy. Drug: Mr. Lennartz  reports no history of drug use. Alcohol:  reports no history of alcohol use. Tobacco:  reports that he has been smoking cigarettes. He has a 60.00 pack-year smoking history. He uses smokeless tobacco. Social: Mr. Genrich  reports that he has been smoking cigarettes. He has a 60.00 pack-year smoking history. He uses smokeless tobacco. He reports that he does not drink alcohol and does not use drugs. Medical:  has a past medical history of Allergy, Arthritis, Cancer ([place]), Chronic back pain, Current every day smoker, Diverticulosis, GERD (gastroesophageal reflux disease), H/O diverticulitis of colon, Restless leg syndrome, and Sciatic pain. Surgical: Mr. Drummond  has a past surgical history that includes  Tonsillectomy and adenoidectomy; Total knee arthroplasty (Left, 10/14/2017); skin cancer removal; and Joint replacement (Left, 10/14/2018). Family: family history includes Alzheimer's disease in his father; Cancer (age of onset: 66) in his sister; Diabetes in his sister; Healthy in his brother, son, son, son, and son; Heart disease in his brother; Hypertension in his mother; Prostate cancer (age of onset: 83) in his father.  Laboratory Chemistry Profile   Renal Lab Results  Component Value Date   BUN 13 04/18/2019   CREATININE 0.86 04/18/2019   GFR 68.66 04/02/2014   GFRAA >60 04/18/2019   GFRNONAA >60 04/18/2019     Hepatic Lab Results  Component Value Date   AST 13 (L) 04/18/2019   ALT 15 04/18/2019   ALBUMIN 4.6 04/18/2019   ALKPHOS 66 04/18/2019   LIPASE 44 03/22/2019     Electrolytes Lab Results  Component Value Date   NA 143 04/18/2019   K 3.7 04/18/2019   CL 108 04/18/2019   CALCIUM 9.6 04/18/2019     Bone No results found for: VD25OH, VD125OH2TOT, TM1962IW9, NL8921JH4, 25OHVITD1, 25OHVITD2, 25OHVITD3, TESTOFREE, TESTOSTERONE   Inflammation (CRP: Acute Phase) (ESR: Chronic Phase) Lab Results  Component Value Date   ESRSEDRATE 30 (H) 09/29/2017       Note: Above Lab results reviewed.   Physical Exam  General appearance: Well nourished, well developed, and well hydrated. In no apparent acute distress Mental status: Alert, oriented x 3 (person, place, & time)       Respiratory: No evidence of acute respiratory distress Eyes: PERLA Vitals: BP (!) 155/85   Pulse (!) 115   Temp (!) 96.3 F (35.7 C)   Resp 20   Ht _0  (1.854 m)   Wt 223 lb (101.2 kg)   SpO2 96%   BMI 29.42 kg/m  BMI: Estimated body mass index is 29.42 kg/m as calculated from the following:   Height as of this encounter: _1  (1.854 m).   Weight as of this encounter: 223 lb (101.2 kg). Ideal: Ideal body weight: 79.9 kg (176 lb 2.4 oz) Adjusted ideal body weight: 88.4 kg (194 lb 14.2  oz)   Lumbar Spine Area Exam  Skin & Axial Inspection:No masses, redness, or swelling Alignment:Symmetrical Functional RDE:YCXK restricted lumbar extension Stability:No instability detected Muscle Tone/Strength:Functionally intact. No obvious neuro-muscular anomalies detected. Sensory (Neurological):Dermatomal pain patternRIGHT L4/5 greater than left Palpation:No palpable anomalies Provocative Tests: Hyperextension/rotation test:deferred today Lumbar quadrant test (Kemp's test):(+)on the right for foraminal stenosis greater than left Lateral bending test:deferred today Patrick's Maneuver:deferred today FABER* test:deferred today S-I anterior distraction/compression test:deferred today S-I lateral compression test:deferred today S-I Thigh-thrust test:deferred today S-I Gaenslen's  test:deferred today *(Flexion, ABduction and External Rotation) Gait & Posture Assessment  Ambulation:Unassisted Gait:Relatively normal for age and body habitus Posture:WNL Lower Extremity Exam    Side:Right lower extremity  Side:Left lower extremity  Stability:No instability observed  Stability:No instability observed  Skin & Extremity Inspection:Skin color, temperature, and hair growth are WNL. No peripheral edema or cyanosis. No masses, redness, swelling, asymmetry, or associated skin lesions. No contractures.  Skin & Extremity Inspection:Skin color, temperature, and hair growth are WNL. No peripheral edema or cyanosis. No masses, redness, swelling, asymmetry, or associated skin lesions. No contractures.  Functional YYQ:MGNOIBBCWUGQ ROM   Functional BVQ:XIHWTUUEKCMK ROM   Muscle Tone/Strength:Functionally intact. No obvious neuro-muscular anomalies detected.  Muscle Tone/Strength:Functionally intact. No obvious neuro-muscular  anomalies detected.  Sensory (Neurological):Dermatomal pain pattern  Sensory (Neurological):Unimpaired  DTR: Patellar:deferred today Achilles:deferred today Plantar:deferred today  DTR: Patellar:deferred today Achilles:deferred today Plantar:deferred today  Palpation:No palpable anomalies  Palpation:No palpable anomalies    Assessment   Status Diagnosis  Worsening Worsening Persistent 1. Lumbar radiculopathy   2. Lumbar foraminal stenosis   3. Chronic pain syndrome      Plan of Care  Mr. RAYMON SCHLARB has a current medication list which includes the following long-term medication(s): atorvastatin, duloxetine, and gabapentin.  Pharmacotherapy (Medications Ordered): Meds ordered this encounter  Medications  . Oxycodone HCl 10 MG TABS    Sig: Take 1 tablet (10 mg total) by mouth 3 (three) times daily as needed.    Dispense:  75 tablet    Refill:  0    For chronic pain syndrome. Not to exceed 75 tablets/month  . Oxycodone HCl 10 MG TABS    Sig: Take 1 tablet (10 mg total) by mouth 3 (three) times daily as needed.    Dispense:  75 tablet    Refill:  0    For chronic pain syndrome. Not to exceed 75 tablets/month   Orders:  Orders Placed This Encounter  Procedures  . Ambulatory referral to Psychology    Referral Priority:   Routine    Referral Type:   Psychiatric    Referral Reason:   Specialty Services Required    Referred to Provider:   Renaee Munda, PhD    Requested Specialty:   Psychology    Number of Visits Requested:   1   Patient is instructed to contact us after he has completed his psychological evaluation so that we can try and schedule spinal cord stimulator trial. Pain contract reviewed with the patient as well as instructions on appropriate medication intake and medication compliance. Continue gabapentin as prescribed.  Follow-up plan:   Return in about 8 weeks (around 03/13/2021) for Medication Management, in person.     Recent Visits Date Type Provider Dept  12/30/20 Procedure visit [name], MD [facility]  10/21/20 Office Visit [name], MD [facility]  Showing recent visits within past 90 days and meeting all other requirements Today's Visits Date Type Provider Dept  01/16/21 Office Visit [name], MD [facility]  Showing today's visits and meeting all other requirements Future Appointments Date Type Provider Dept  03/11/21 Appointment [name], MD [facility]  Showing future appointments within next 90 days and meeting all other requirements  I discussed the assessment and treatment plan with the patient. The patient was provided an opportunity to ask questions and all were answered. The patient agreed with the plan and demonstrated an understanding of the instructions.  Patient advised to call back or  seek an in-person evaluation if the symptoms or condition worsens.  Duration of encounter: 60mnutes.  Note by: BGillis Santa MD Date: 01/16/2021; Time: 12:40 PM

## 2021-01-16 NOTE — Patient Instructions (Signed)
____________________________________________________________________________________________  Medication Rules  Applies to: All patients receiving prescriptions (written or electronic).  Pharmacy of record: Pharmacy where electronic prescriptions will be sent. If written prescriptions are taken to a different pharmacy, please inform the nursing staff. The pharmacy listed in the electronic medical record should be the one where you would like electronic prescriptions to be sent.  Prescription refills: Only during scheduled appointments. Applies to both, written and electronic prescriptions.  NOTE: The following applies primarily to controlled substances (Opioid* Pain Medications).   Patient's responsibilities: 1. Pain Pills: Bring all pain pills to every appointment (except for procedure appointments). 2. Pill Bottles: Bring pills in original pharmacy bottle. Always bring newest bottle. Bring bottle, even if empty. 3. Medication refills: You are responsible for knowing and keeping track of what medications you need refilled. The day before your appointment, write a list of all prescriptions that need to be refilled. Bring that list to your appointment and give it to the admitting nurse. Prescriptions will be written only during appointments. If you forget a medication, it will not be "Called in", "Faxed", or "electronically sent". You will need to get another appointment to get these prescribed. 4. Prescription Accuracy: You are responsible for carefully inspecting your prescriptions before leaving our office. Have the discharge nurse carefully go over each prescription with you, before taking them home. Make sure that your name is accurately spelled, that your address is correct. Check the name and dose of your medication to make sure it is accurate. Check the number of pills, and the written instructions to make sure they are clear and accurate. Make sure that you are given enough medication to last  until your next medication refill appointment. 5. Taking Medication: Take medication as prescribed. Never take more pills than instructed. Never take medication more frequently than prescribed. Taking less pills or less frequently is permitted and encouraged, when it comes to controlled substances (written prescriptions).  6. Inform other Doctors: Always inform, all of your healthcare providers, of all the medications you take. 7. Pain Medication from other Providers: You are not allowed to accept any additional pain medication from any other Doctor or Healthcare provider. There are two exceptions to this rule. (see below) In the event that you require additional pain medication, you are responsible for notifying us, as stated below. 8. Medication Agreement: You are responsible for carefully reading and following our Medication Agreement. This must be signed before receiving any prescriptions from our practice. Safely store a copy of your signed Agreement. Violations to the Agreement will result in no further prescriptions. (Additional copies of our Medication Agreement are available upon request.) 9. Laws, Rules, & Regulations: All patients are expected to follow all Federal and State Laws, Statutes, Rules, & Regulations. Ignorance of the Laws does not constitute a valid excuse. The use of any illegal substances is prohibited. 10. Adopted CDC guidelines & recommendations: Target dosing levels will be at or below 60 MME/day. Use of benzodiazepines** is not recommended.  Exceptions: There are only two exceptions to the rule of not receiving pain medications from other Healthcare Providers. 1. Exception #1 (Emergencies): In the event of an emergency (i.e.: accident requiring emergency care), you are allowed to receive additional pain medication. However, you are responsible for: As soon as you are able, call our office [phone], at any time of the day or night, and leave a message stating your name, the  date and nature of the emergency, and the name and dose of the medication   prescribed. In the event that your call is answered by a member of our staff, make sure to document and save the date, time, and the name of the person that took your information.  2. Exception #2 (Planned Surgery): In the event that you are scheduled by another doctor or dentist to have any type of surgery or procedure, you are allowed (for a period no longer than 30 days), to receive additional pain medication, for the acute post-op pain. However, in this case, you are responsible for picking up a copy of our "Post-op Pain Management for Surgeons" handout, and giving it to your surgeon or dentist. This document is available at our office, and does not require an appointment to obtain it. Simply go to our office during business hours (Monday-Thursday from 8:00 AM to 4:00 PM) (Friday 8:00 AM to 12:00 Noon) or if you have a scheduled appointment with us, prior to your surgery, and ask for it by name. In addition, you will need to provide us with your name, name of your surgeon, type of surgery, and date of procedure or surgery.  *Opioid medications include: morphine, codeine, oxycodone, oxymorphone, hydrocodone, hydromorphone, meperidine, tramadol, tapentadol, buprenorphine, fentanyl, methadone. **Benzodiazepine medications include: diazepam (Valium), alprazolam (Xanax), clonazepam (Klonopine), lorazepam (Ativan), clorazepate (Tranxene), chlordiazepoxide (Librium), estazolam (Prosom), oxazepam (Serax), temazepam (Restoril), triazolam (Halcion) (Last updated: 12/16/2017) ____________________________________________________________________________________________    

## 2021-02-19 ENCOUNTER — Emergency Department: Payer: Self-pay

## 2021-02-19 ENCOUNTER — Other Ambulatory Visit: Payer: Self-pay

## 2021-02-19 ENCOUNTER — Inpatient Hospital Stay
Admission: EM | Admit: 2021-02-19 | Discharge: 2021-02-22 | DRG: 871 | Disposition: A | Discharge status: Home / Self Care | Payer: Self-pay | Attending: Internal Medicine | Admitting: Internal Medicine

## 2021-02-19 DIAGNOSIS — N401 Enlarged prostate with lower urinary tract symptoms: Secondary | ICD-10-CM | POA: Diagnosis present

## 2021-02-19 DIAGNOSIS — K219 Gastro-esophageal reflux disease without esophagitis: Secondary | ICD-10-CM | POA: Diagnosis present

## 2021-02-19 DIAGNOSIS — Z6831 Body mass index (BMI) 31.0-31.9, adult: Secondary | ICD-10-CM

## 2021-02-19 DIAGNOSIS — I10 Essential (primary) hypertension: Secondary | ICD-10-CM | POA: Diagnosis present

## 2021-02-19 DIAGNOSIS — F17210 Nicotine dependence, cigarettes, uncomplicated: Secondary | ICD-10-CM | POA: Diagnosis present

## 2021-02-19 DIAGNOSIS — R4182 Altered mental status, unspecified: Secondary | ICD-10-CM

## 2021-02-19 DIAGNOSIS — E785 Hyperlipidemia, unspecified: Secondary | ICD-10-CM | POA: Diagnosis present

## 2021-02-19 DIAGNOSIS — M6282 Rhabdomyolysis: Secondary | ICD-10-CM | POA: Diagnosis present

## 2021-02-19 DIAGNOSIS — R748 Abnormal levels of other serum enzymes: Secondary | ICD-10-CM | POA: Diagnosis present

## 2021-02-19 DIAGNOSIS — A411 Sepsis due to other specified staphylococcus: Principal | ICD-10-CM | POA: Diagnosis present

## 2021-02-19 DIAGNOSIS — Z20822 Contact with and (suspected) exposure to covid-19: Secondary | ICD-10-CM | POA: Diagnosis present

## 2021-02-19 DIAGNOSIS — G9341 Metabolic encephalopathy: Secondary | ICD-10-CM | POA: Diagnosis present

## 2021-02-19 DIAGNOSIS — R197 Diarrhea, unspecified: Secondary | ICD-10-CM

## 2021-02-19 DIAGNOSIS — G2581 Restless legs syndrome: Secondary | ICD-10-CM | POA: Diagnosis present

## 2021-02-19 DIAGNOSIS — Z85820 Personal history of malignant melanoma of skin: Secondary | ICD-10-CM

## 2021-02-19 DIAGNOSIS — Z96652 Presence of left artificial knee joint: Secondary | ICD-10-CM | POA: Diagnosis present

## 2021-02-19 DIAGNOSIS — F32A Depression, unspecified: Secondary | ICD-10-CM | POA: Diagnosis present

## 2021-02-19 DIAGNOSIS — T40601A Poisoning by unspecified narcotics, accidental (unintentional), initial encounter: Secondary | ICD-10-CM

## 2021-02-19 DIAGNOSIS — G894 Chronic pain syndrome: Secondary | ICD-10-CM | POA: Diagnosis present

## 2021-02-19 DIAGNOSIS — Z91013 Allergy to seafood: Secondary | ICD-10-CM

## 2021-02-19 DIAGNOSIS — A419 Sepsis, unspecified organism: Secondary | ICD-10-CM | POA: Diagnosis present

## 2021-02-19 DIAGNOSIS — J9601 Acute respiratory failure with hypoxia: Secondary | ICD-10-CM | POA: Diagnosis present

## 2021-02-19 DIAGNOSIS — G928 Other toxic encephalopathy: Secondary | ICD-10-CM | POA: Diagnosis present

## 2021-02-19 DIAGNOSIS — R350 Frequency of micturition: Secondary | ICD-10-CM | POA: Diagnosis present

## 2021-02-19 DIAGNOSIS — T402X1A Poisoning by other opioids, accidental (unintentional), initial encounter: Secondary | ICD-10-CM | POA: Diagnosis present

## 2021-02-19 DIAGNOSIS — J690 Pneumonitis due to inhalation of food and vomit: Secondary | ICD-10-CM | POA: Diagnosis present

## 2021-02-19 DIAGNOSIS — R112 Nausea with vomiting, unspecified: Secondary | ICD-10-CM

## 2021-02-19 DIAGNOSIS — R6520 Severe sepsis without septic shock: Secondary | ICD-10-CM | POA: Diagnosis present

## 2021-02-19 LAB — CBC WITH DIFFERENTIAL/PLATELET
Abs Immature Granulocytes: 0.1 10*3/uL — ABNORMAL HIGH (ref 0.00–0.07)
Basophils Absolute: 0 10*3/uL (ref 0.0–0.1)
Basophils Relative: 0 %
Eosinophils Absolute: 0 10*3/uL (ref 0.0–0.5)
Eosinophils Relative: 0 %
HCT: 37 % — ABNORMAL LOW (ref 39.0–52.0)
Hemoglobin: 12.2 g/dL — ABNORMAL LOW (ref 13.0–17.0)
Immature Granulocytes: 1 %
Lymphocytes Relative: 5 %
Lymphs Abs: 0.8 10*3/uL (ref 0.7–4.0)
MCH: 30.7 pg (ref 26.0–34.0)
MCHC: 33 g/dL (ref 30.0–36.0)
MCV: 93.2 fL (ref 80.0–100.0)
Monocytes Absolute: 1.2 10*3/uL — ABNORMAL HIGH (ref 0.1–1.0)
Monocytes Relative: 8 %
Neutro Abs: 13.4 10*3/uL — ABNORMAL HIGH (ref 1.7–7.7)
Neutrophils Relative %: 86 %
Platelets: 189 10*3/uL (ref 150–400)
RBC: 3.97 MIL/uL — ABNORMAL LOW (ref 4.22–5.81)
RDW: 12.9 % (ref 11.5–15.5)
WBC: 15.4 10*3/uL — ABNORMAL HIGH (ref 4.0–10.5)
nRBC: 0 % (ref 0.0–0.2)

## 2021-02-19 LAB — COMPREHENSIVE METABOLIC PANEL
ALT: 26 U/L (ref 0–44)
AST: 32 U/L (ref 15–41)
Albumin: 4.2 g/dL (ref 3.5–5.0)
Alkaline Phosphatase: 70 U/L (ref 38–126)
Anion gap: 11 (ref 5–15)
BUN: 20 mg/dL (ref 8–23)
CO2: 24 mmol/L (ref 22–32)
Calcium: 8.7 mg/dL — ABNORMAL LOW (ref 8.9–10.3)
Chloride: 103 mmol/L (ref 98–111)
Creatinine, Ser: 1.17 mg/dL (ref 0.61–1.24)
GFR, Estimated: >60 mL/min (ref >60)
Glucose, Bld: 142 mg/dL — ABNORMAL HIGH (ref 70–99)
Potassium: 4.6 mmol/L (ref 3.5–5.1)
Sodium: 138 mmol/L (ref 135–145)
Total Bilirubin: 0.8 mg/dL (ref 0.3–1.2)
Total Protein: 7.1 g/dL (ref 6.5–8.1)

## 2021-02-19 LAB — URINALYSIS, COMPLETE (UACMP) WITH MICROSCOPIC
Bacteria, UA: NONE SEEN
Bilirubin Urine: NEGATIVE
Glucose, UA: NEGATIVE mg/dL
Hgb urine dipstick: NEGATIVE
Ketones, ur: NEGATIVE mg/dL
Leukocytes,Ua: NEGATIVE
Nitrite: NEGATIVE
Protein, ur: 30 mg/dL — AB
Specific Gravity, Urine: 1.017 (ref 1.005–1.030)
pH: 5 (ref 5.0–8.0)

## 2021-02-19 LAB — ETHANOL: Alcohol, Ethyl (B): <10 mg/dL (ref <10)

## 2021-02-19 LAB — BLOOD GAS, VENOUS
Acid-Base Excess: 1 mmol/L (ref 0.0–2.0)
Bicarbonate: 28.9 mmol/L — ABNORMAL HIGH (ref 20.0–28.0)
O2 Saturation: 86.8 %
Patient temperature: 37
pCO2, Ven: 60 mmHg (ref 44.0–60.0)
pH, Ven: 7.29 (ref 7.250–7.430)
pO2, Ven: 59 mmHg — ABNORMAL HIGH (ref 32.0–45.0)

## 2021-02-19 LAB — URINE DRUG SCREEN, QUALITATIVE (ARMC ONLY)
Amphetamines, Ur Screen: NOT DETECTED
Barbiturates, Ur Screen: NOT DETECTED
Benzodiazepine, Ur Scrn: POSITIVE — AB
Cannabinoid 50 Ng, Ur ~~LOC~~: NOT DETECTED
Cocaine Metabolite,Ur ~~LOC~~: NOT DETECTED
MDMA (Ecstasy)Ur Screen: NOT DETECTED
Methadone Scn, Ur: NOT DETECTED
Opiate, Ur Screen: POSITIVE — AB
Phencyclidine (PCP) Ur S: NOT DETECTED
Tricyclic, Ur Screen: NOT DETECTED

## 2021-02-19 LAB — BRAIN NATRIURETIC PEPTIDE: B Natriuretic Peptide: 126.4 pg/mL — ABNORMAL HIGH (ref 0.0–100.0)

## 2021-02-19 LAB — ACETAMINOPHEN LEVEL: Acetaminophen (Tylenol), Serum: <10 ug/mL — ABNORMAL LOW (ref 10–30)

## 2021-02-19 LAB — CK: Total CK: 884 U/L — ABNORMAL HIGH (ref 49–397)

## 2021-02-19 LAB — SALICYLATE LEVEL: Salicylate Lvl: <7 mg/dL — ABNORMAL LOW (ref 7.0–30.0)

## 2021-02-19 LAB — RESP PANEL BY RT-PCR (FLU A&B, COVID) ARPGX2
Influenza A by PCR: NEGATIVE
Influenza B by PCR: NEGATIVE
SARS Coronavirus 2 by RT PCR: NEGATIVE

## 2021-02-19 LAB — LACTIC ACID, PLASMA: Lactic Acid, Venous: 1.9 mmol/L (ref 0.5–1.9)

## 2021-02-19 LAB — PROCALCITONIN: Procalcitonin: 0.43 ng/mL

## 2021-02-19 MED ORDER — LACTATED RINGERS IV SOLN: INTRAVENOUS | Status: DC

## 2021-02-19 MED ORDER — GABAPENTIN 600 MG PO TABS
600.0000 mg | ORAL_TABLET | Freq: Three times a day (TID) | ORAL | Status: DC
  Administered 2021-02-19 – 2021-02-22 (×9): 600 mg via ORAL
  Filled 2021-02-19 (×9): qty 1

## 2021-02-19 MED ORDER — LACTATED RINGERS IV BOLUS
1000.0000 mL | Freq: Once | INTRAVENOUS | Status: AC
  Administered 2021-02-19: 1000 mL via INTRAVENOUS

## 2021-02-19 MED ORDER — TAMSULOSIN HCL 0.4 MG PO CAPS
0.4000 mg | ORAL_CAPSULE | Freq: Every day | ORAL | Status: DC
  Administered 2021-02-19 – 2021-02-21 (×3): 0.4 mg via ORAL
  Filled 2021-02-19 (×3): qty 1

## 2021-02-19 MED ORDER — ATORVASTATIN CALCIUM 20 MG PO TABS: 40.0000 mg | ORAL_TABLET | Freq: Every evening | ORAL | Status: DC

## 2021-02-19 MED ORDER — NALOXONE HCL 2 MG/2ML IJ SOSY
0.4000 mg | PREFILLED_SYRINGE | INTRAMUSCULAR | Status: DC | PRN
  Administered 2021-02-19: 0.4 mg via INTRAVENOUS
  Filled 2021-02-19 (×2): qty 2

## 2021-02-19 MED ORDER — ONDANSETRON HCL 4 MG/2ML IJ SOLN
4.0000 mg | Freq: Once | INTRAMUSCULAR | Status: AC
  Administered 2021-02-19: 4 mg via INTRAVENOUS
  Filled 2021-02-19: qty 2

## 2021-02-19 MED ORDER — TIZANIDINE HCL 4 MG PO TABS
4.0000 mg | ORAL_TABLET | Freq: Three times a day (TID) | ORAL | Status: DC | PRN
  Filled 2021-02-19 (×2): qty 1

## 2021-02-19 MED ORDER — ENOXAPARIN SODIUM 60 MG/0.6ML IJ SOSY
0.5000 mg/kg | PREFILLED_SYRINGE | INTRAMUSCULAR | Status: DC
  Administered 2021-02-19 – 2021-02-21 (×3): 55 mg via SUBCUTANEOUS
  Filled 2021-02-19 (×3): qty 0.6

## 2021-02-19 MED ORDER — ACETAMINOPHEN 650 MG RE SUPP: 650.0000 mg | Freq: Four times a day (QID) | RECTAL | Status: DC | PRN

## 2021-02-19 MED ORDER — DM-GUAIFENESIN ER 30-600 MG PO TB12: 1.0000 | ORAL_TABLET | Freq: Two times a day (BID) | ORAL | Status: DC | PRN

## 2021-02-19 MED ORDER — ONDANSETRON HCL 4 MG/2ML IJ SOLN: 4.0000 mg | Freq: Three times a day (TID) | INTRAMUSCULAR | Status: DC | PRN

## 2021-02-19 MED ORDER — NICOTINE 21 MG/24HR TD PT24
21.0000 mg | MEDICATED_PATCH | Freq: Every day | TRANSDERMAL | Status: DC
  Administered 2021-02-19 – 2021-02-22 (×4): 21 mg via TRANSDERMAL
  Filled 2021-02-19 (×4): qty 1

## 2021-02-19 MED ORDER — SODIUM CHLORIDE 0.9 % IV SOLN
3.0000 g | Freq: Four times a day (QID) | INTRAVENOUS | Status: DC
  Administered 2021-02-19 – 2021-02-22 (×12): 3 g via INTRAVENOUS
  Filled 2021-02-19: qty 3
  Filled 2021-02-19: qty 8
  Filled 2021-02-19 (×2): qty 3
  Filled 2021-02-19: qty 8
  Filled 2021-02-19 (×2): qty 3
  Filled 2021-02-19: qty 8
  Filled 2021-02-19: qty 3
  Filled 2021-02-19: qty 8
  Filled 2021-02-19 (×3): qty 3
  Filled 2021-02-19: qty 8
  Filled 2021-02-19: qty 3
  Filled 2021-02-19: qty 8

## 2021-02-19 MED ORDER — PIPERACILLIN-TAZOBACTAM 3.375 G IVPB 30 MIN
3.3750 g | Freq: Once | INTRAVENOUS | Status: AC
  Administered 2021-02-19: 3.375 g via INTRAVENOUS
  Filled 2021-02-19: qty 50

## 2021-02-19 MED ORDER — HYDRALAZINE HCL 20 MG/ML IJ SOLN
5.0000 mg | INTRAMUSCULAR | Status: DC | PRN
  Administered 2021-02-21 (×2): 5 mg via INTRAVENOUS
  Filled 2021-02-19 (×2): qty 1

## 2021-02-19 MED ORDER — DICLOFENAC SODIUM 1 % EX GEL
1.0000 "application " | Freq: Three times a day (TID) | CUTANEOUS | Status: DC | PRN
  Filled 2021-02-19: qty 100

## 2021-02-19 MED ORDER — ALBUTEROL SULFATE (2.5 MG/3ML) 0.083% IN NEBU: 2.5000 mg | INHALATION_SOLUTION | RESPIRATORY_TRACT | Status: DC | PRN

## 2021-02-19 MED ORDER — ACETAMINOPHEN 325 MG PO TABS
650.0000 mg | ORAL_TABLET | Freq: Four times a day (QID) | ORAL | Status: DC | PRN
  Administered 2021-02-21: 650 mg via ORAL
  Filled 2021-02-19: qty 2

## 2021-02-19 NOTE — ED Triage Notes (Signed)
Pt to ER via [facility] from home with complaints of altered mental status that started last night, family report to EMS that it has progressively gotten worse since then including difficulty communicating. Pt alert to self only on arrival. EMS also state patient has taken approximately a total of 75,10mg  oxycodone. Pt also has had diarrhea and episodes of emesis at home.   Per EMS, pt on o2 chronically at home. Pt placed on 4L via nasal cannula on arrival due to room air sats being 97-80%.

## 2021-02-19 NOTE — Progress Notes (Signed)
PHARMACIST - PHYSICIAN COMMUNICATION  CONCERNING:  Enoxaparin (Lovenox) for DVT Prophylaxis    RECOMMENDATION: Patient was prescribed enoxaparin 40mg  q24 hours for VTE prophylaxis.   Filed Weights   02/19/21 1031  Weight: 108.7 kg (239 lb 10.2 oz)    Body mass index is 31.62 kg/m.  Estimated Creatinine Clearance: 84.6 mL/min (by C-G formula based on SCr of 1.17 mg/dL).   Based on [place] patient is candidate for enoxaparin 0.5mg /kg TBW SQ every 24 hours based on BMI being >30.  DESCRIPTION: Pharmacy has adjusted enoxaparin dose per [facility] policy.  Patient is now receiving enoxaparin 55 mg every 24 hours    Benita Gutter 02/19/2021 2:19 PM

## 2021-02-19 NOTE — Progress Notes (Signed)
Patient's live in partner/girlfriend [place] called and made aware of patient's room location.  Belonging policy reviewed with [facility]. Per Kieth Brightly she had taken all patient;s belonging except his shorts and a bag of medication.  Kieth Brightly stated she would like medication and shorts placed in hospital bag at this time and she will pick them up.

## 2021-02-19 NOTE — H&P (Addendum)
History and Physical    Anthony Nguyen [id number] DOB: 1956-07-18 DOA: 02/19/2021  Referring MD/NP/PA:   PCP: Nelle Don, MD   Patient coming from:  The patient is coming from home.  At baseline, pt is independent for most of ADL.        Chief Complaint: AMS  HPI: Anthony Nguyen is a 65 y.o. male with medical history significant of hypertension, hyperlipidemia, GERD, depression, RLS, diverticulitis, melanoma, smoker, BPPV, BPH, chronic pain syndrome on oxycodone, who presents with altered mental status.  Per her girlfriend (I spoke with her on the phone), patient was found to be confused, somnolent, sleepier since last night. He has slurred speech. He had fallen asleep on the couch around 4 AM and then woke up this morning to find him face down on the ground. He has some blood around his nose and month. When I saw pt in ED, he is confused, lethargic, somnolent, but arousable. He knows his own name.  He knows that he is in hospital, but cannot give the name of hospital.  He is confused about time.  He moves all extremities normally.  No facial droop. Girlfriend is concerned that he may be taking more pain medication. He possibly took 7-8 pills of Oxycodone due to worsening back pain.  He has dry cough, his oxygen saturation 90% on room air, which improved to 97% of 3 L oxygen. No fever.  No chest pain. Per his girlfriend, pt is not using oxygen normally at home. Pt has possible OSA per his girl friend.  Per report, patient had nausea and vomiting, but per her girlfriend, patient did not have nausea, vomiting, diarrhea or abdominal pain.  Currently patient denies nausea, vomiting, diarrhea or abdominal pain.  He has chronic difficulty urinating due to BPH which has not changed.  No dysuria or burning on urination per her girlfriend.    ED Course: pt was found to have WBC 15.4, pending BMP, negative COVID PCR, alcohol level less than 10, CK8 84, salicylate level less than 7,  lactic acid level 1.9, GFR>60, temperature 99.6, softer blood pressure, tachycardia with heart rate of 115, RR 18, chest x-ray showed right upper lobe infiltration and right perihilar opacity.  CT of head is negative for acute intracranial abnormalities.  Patient is admitted to [place] bed as inpatient.   Chest x-ray: 1. RIGHT upper lobe infiltrate consistent with pneumonia.  2. Significant RIGHT perihilar opacity may reflect consolidation but underlying mass and adenopathy are not excluded; either radiographic follow-up until resolution or CT imaging recommended to exclude tumor and adenopathy.    Review of Systems: Could not be reviewed accurately due to altered mental status  Allergy:  Allergies  Allergen Reactions  . Shellfish Allergy Swelling    Oysters and fish last time he consumed caused swelling in the bottom legs     Past Medical History:  Diagnosis Date  . Allergy   . Arthritis    osteoarthritis  . Cancer ([place])    melanoma skin cancer face  . Chronic back pain    goes to Dr. Holley Raring at the pain clinic  . Current every day smoker   . Diverticulosis   . GERD (gastroesophageal reflux disease)   . H/O diverticulitis of colon   . Restless leg syndrome   . Sciatic pain     Past Surgical History:  Procedure Laterality Date  . JOINT REPLACEMENT Left 10/14/2018   knee  . skin cancer removal  face  . TONSILLECTOMY AND ADENOIDECTOMY    . TOTAL KNEE ARTHROPLASTY Left 10/14/2017   Procedure: TOTAL KNEE ARTHROPLASTY;  Surgeon: Hessie Knows, MD;  Location: [facility] ORS;  Service: Orthopedics;  Laterality: Left;    Social History:  reports that he has been smoking cigarettes. He has a 60.00 pack-year smoking history. He uses smokeless tobacco. He reports that he does not drink alcohol and does not use drugs.  Family History:  Family History  Problem Relation Age of Onset  . Hypertension Mother   . Alzheimer's disease Father   . Prostate cancer Father 11  . Cancer  Sister 42       breast cancer  . Diabetes Sister   . Heart disease Brother        electrical pathways - ongoing workup  . Healthy Brother   . Healthy Son   . Healthy Son   . Healthy Son   . Healthy Son      Prior to Admission medications   Medication Sig Start Date End Date Taking? Authorizing Provider  albuterol (VENTOLIN HFA) 108 (90 Base) MCG/ACT inhaler Inhale into the lungs. 11/16/14   [provider]  atorvastatin (LIPITOR) 40 MG tablet Take 1 tablet by mouth daily. 03/21/20 03/21/21  [provider]  Diclofenac Sodium 3 % GEL Place 1 application onto the skin 3 (three) times daily as needed (left knee pain). 08/09/19   Mikey College, NP  DULoxetine (CYMBALTA) 30 MG capsule Take by mouth. 03/19/20 03/19/21  [provider]  gabapentin (NEURONTIN) 600 MG tablet Take 600 mg by mouth 3 (three) times daily.    [provider]  lisinopril (ZESTRIL) 5 MG tablet Take 5 mg by mouth daily.    [provider]  nicotine (NICODERM CQ - DOSED IN MG/24 HOURS) 21 mg/24hr patch Place onto the skin. 03/29/20   [provider]  Oxycodone HCl 10 MG TABS Take 1 tablet (10 mg total) by mouth 3 (three) times daily as needed. 02/15/21 03/17/21  [name], MD  silodosin (RAPAFLO) 8 MG CAPS capsule SMARTSIG:1 Capsule(s) By Mouth Every Evening 01/13/20   [provider]  tiZANidine (ZANAFLEX) 4 MG tablet Take 1 tablet (4 mg total) by mouth every 8 (eight) hours as needed for muscle spasms. 01/14/21   [name], MD    Physical Exam: Vitals:   02/19/21 1300 02/19/21 1330 02/19/21 1410 02/19/21 1411  BP: 110/66 (!) 119/58    Pulse: 100 96 98   Resp:      Temp:    98.6 F (37 C)  TempSrc:    Oral  SpO2: 93% 93% 94%   Weight:      Height:       General: Not in acute distress HEENT: has trace amount of dry blood around nose and mouth       Eyes: PERRL, EOMI, no scleral icterus.       ENT: No discharge from the ears and nose.         Neck: No JVD, no bruit, no mass felt. Heme: No neck lymph node enlargement. Cardiac: S1/S2, RRR, No murmurs, No gallops or rubs. Respiratory: Has scattered rhonchi bilaterally GI: Soft, nondistended, nontender, no organomegaly, BS present. GU: No hematuria Ext: has 1+ pitting leg edema bilaterally. 1+DP/PT pulse bilaterally. Musculoskeletal: No joint deformities, No joint redness or warmth, no limitation of ROM in spin. Skin: No rashes.  Neuro: Confused, somnolent, but arousable, he knows his own name, he knows that he is  in hospital, he is confused about time. Cranial nerves II-XII grossly intact, moves all extremities normally.  Psych: Patient is not psychotic, no suicidal or hemocidal ideation.  Labs on Admission: I have personally reviewed following labs and imaging studies  CBC: Recent Labs  Lab 02/19/21 1030  WBC 15.4*  NEUTROABS 13.4*  HGB 12.2*  HCT 37.0*  MCV 93.2  PLT 073   Basic Metabolic Panel: Recent Labs  Lab 02/19/21 1030  NA 138  K 4.6  CL 103  CO2 24  GLUCOSE 142*  BUN 20  CREATININE 1.17  CALCIUM 8.7*   GFR: Estimated Creatinine Clearance: 84.6 mL/min (by C-G formula based on SCr of 1.17 mg/dL). Liver Function Tests: Recent Labs  Lab 02/19/21 1030  AST 32  ALT 26  ALKPHOS 70  BILITOT 0.8  PROT 7.1  ALBUMIN 4.2   No results for input(s): LIPASE, AMYLASE in the last 168 hours. No results for input(s): AMMONIA in the last 168 hours. Coagulation Profile: No results for input(s): INR, PROTIME in the last 168 hours. Cardiac Enzymes: Recent Labs  Lab 02/19/21 1030  CKTOTAL 884*   BNP (last 3 results) No results for input(s): PROBNP in the last 8760 hours. HbA1C: No results for input(s): HGBA1C in the last 72 hours. CBG: No results for input(s): GLUCAP in the last 168 hours. Lipid Profile: No results for input(s): CHOL, HDL, LDLCALC, TRIG, CHOLHDL, LDLDIRECT in the last 72 hours. Thyroid Function Tests: No results for input(s): TSH,  T4TOTAL, FREET4, T3FREE, THYROIDAB in the last 72 hours. Anemia Panel: No results for input(s): VITAMINB12, FOLATE, FERRITIN, TIBC, IRON, RETICCTPCT in the last 72 hours. Urine analysis:    Component Value Date/Time   COLORURINE YELLOW (A) 04/18/2019 2225   APPEARANCEUR CLEAR (A) 04/18/2019 2225   APPEARANCEUR Clear 03/14/2014 1216   LABSPEC 1.025 04/18/2019 2225   LABSPEC 1.018 03/14/2014 1216   PHURINE 6.0 04/18/2019 2225   GLUCOSEU NEGATIVE 04/18/2019 2225   GLUCOSEU Negative 03/14/2014 1216   HGBUR NEGATIVE 04/18/2019 2225   BILIRUBINUR NEGATIVE 04/18/2019 2225   BILIRUBINUR Negative 03/14/2014 1216   KETONESUR NEGATIVE 04/18/2019 2225   PROTEINUR NEGATIVE 04/18/2019 2225   UROBILINOGEN 0.2 03/17/2014 2208   NITRITE NEGATIVE 04/18/2019 2225   LEUKOCYTESUR NEGATIVE 04/18/2019 2225   LEUKOCYTESUR Negative 03/14/2014 1216   Sepsis Labs: @LABRCNTIP (procalcitonin:4,lacticidven:4) ) Recent Results (from the past 240 hour(s))  Resp Panel by RT-PCR (Flu A&B, Covid) Nasopharyngeal Swab     Status: None   Collection Time: 02/19/21 10:38 AM   Specimen: Nasopharyngeal Swab; Nasopharyngeal(NP) swabs in vial transport medium  Result Value Ref Range Status   SARS Coronavirus 2 by RT PCR NEGATIVE NEGATIVE Final    Comment: (NOTE) SARS-CoV-2 target nucleic acids are NOT DETECTED.  The SARS-CoV-2 RNA is generally detectable in upper respiratory specimens during the acute phase of infection. The lowest concentration of SARS-CoV-2 viral copies this assay can detect is 138 copies/mL. A negative result does not preclude SARS-Cov-2 infection and should not be used as the sole basis for treatment or other patient management decisions. A negative result may occur with  improper specimen collection/handling, submission of specimen other than nasopharyngeal swab, presence of viral mutation(s) within the areas targeted by this assay, and inadequate number of viral copies(<138 copies/mL). A  negative result must be combined with clinical observations, patient history, and epidemiological information. The expected result is Negative.  Fact Sheet for Patients:  EntrepreneurPulse.com.au  Fact Sheet for Healthcare Providers:  IncredibleEmployment.be  This test is no  t yet approved or cleared by the [place] and  has been authorized for detection and/or diagnosis of SARS-CoV-2 by FDA under an Emergency Use Authorization (EUA). This EUA will remain  in effect (meaning this test can be used) for the duration of the COVID-19 declaration under Section 564(b)(1) of the Act, 21 U.S.C.section 360bbb-3(b)(1), unless the authorization is terminated  or revoked sooner.       Influenza A by PCR NEGATIVE NEGATIVE Final   Influenza B by PCR NEGATIVE NEGATIVE Final    Comment: (NOTE) The Xpert Xpress SARS-CoV-2/FLU/RSV plus assay is intended as an aid in the diagnosis of influenza from Nasopharyngeal swab specimens and should not be used as a sole basis for treatment. Nasal washings and aspirates are unacceptable for Xpert Xpress SARS-CoV-2/FLU/RSV testing.  Fact Sheet for Patients: EntrepreneurPulse.com.au  Fact Sheet for Healthcare Providers: IncredibleEmployment.be  This test is not yet approved or cleared by the [place] FDA and has been authorized for detection and/or diagnosis of SARS-CoV-2 by FDA under an Emergency Use Authorization (EUA). This EUA will remain in effect (meaning this test can be used) for the duration of the COVID-19 declaration under Section 564(b)(1) of the Act, 21 U.S.C. section 360bbb-3(b)(1), unless the authorization is terminated or revoked.  Performed at [facility], [address], [place], [place] [address]      Radiological Exams on Admission: CT Head Wo Contrast  Result Date: 02/19/2021 CLINICAL DATA:  Mental status change since last night.  Difficulty communicating. EXAM: CT HEAD WITHOUT CONTRAST TECHNIQUE: Contiguous axial images were obtained from the base of the skull through the vertex without intravenous contrast. COMPARISON:  CT head 04/18/2019. FINDINGS: Brain: There is no evidence of acute intracranial hemorrhage, mass lesion, brain edema or extra-axial fluid collection. The ventricles and subarachnoid spaces are appropriately sized for age. There is no CT evidence of acute cortical infarction. Vascular: Mild intracranial atherosclerosis. No hyperdense vessel identified. Skull: Negative for fracture or focal lesion. Sinuses/Orbits: Mild mucosal thickening in the paranasal sinuses, similar to previous study. No air-fluid levels identified. The mastoid air cells and middle ears are clear. No orbital abnormalities are seen. Other: None. IMPRESSION: 1. Stable CT of the head.  No acute intracranial findings. 2. Mild paranasal sinus disease, similar to previous CT. Electronically Signed   By: Richardean Sale M.D.   On: 02/19/2021 11:30   DG Chest Portable 1 View  Result Date: 02/19/2021 CLINICAL DATA:  Weakness, altered mental status; history of melanoma, GERD, smoker EXAM: PORTABLE CHEST 1 VIEW COMPARISON:  Portable exam 1034 hours compared to 05/12/2019 FINDINGS: Normal heart size, mediastinal contours, and pulmonary vascularity. RIGHT perihilar opacity extending in the RIGHT upper lobe, favor consolidation but cannot exclude underlying mass/adenopathy. Remaining lungs clear. No pleural effusion or pneumothorax. Osseous structures unremarkable. IMPRESSION: RIGHT upper lobe infiltrate consistent with pneumonia. Significant RIGHT perihilar opacity may reflect consolidation but underlying mass and adenopathy are not excluded; either radiographic follow-up until resolution or CT imaging recommended to exclude tumor and adenopathy. Electronically Signed   By: Lavonia Dana M.D.   On: 02/19/2021 11:15     EKG: I have personally reviewed.  Sinus  rhythm, QTC 454, low voltage, nonspecific T wave change  Assessment/Plan Principal Problem:   Aspiration pneumonia (HCC) Active Problems:   Chronic pain syndrome   Benign prostatic hyperplasia with urinary frequency   Essential hypertension   Acute metabolic encephalopathy   HLD (hyperlipidemia)   Sepsis (HCC)   Elevated CK  Sepsis due to  aspiration pneumonia [facility]): Patient meets criteria for sepsis with leukocytosis with WBC 15.4, tachycardia with heart rate of 115.  Lactic acid is normal 1.9.  Blood pressure soft, but hemodynamically stable currently.  - Will admit to med-surg bed as inpt - IV unasyn (pt received 1 dose of Zosyn in ED) - Mucinex for cough  - Bronchodilators - Follow up blood culture x2, sputum culture  - will get Procalcitonin - IVF: 2L of LR bolus in ED, followed by 75 mL per hour of LR  Elevated CK: Ck 884 -IVF as above -repeat CK in AM  Chronic pain syndrome -hold oxycodone now -Continue Neurontin -As needed Tylenol  Acute metabolic encephalopathy: Possibly due to overdose of pain medication oxycodone.  CT head negative for acute intracranial abnormalities. -Hold oxycodone -0.4 mg of Narcan is ordered by EDP -Frequent neurochecks -check UDS  Benign prostatic hyperplasia with urinary frequency -Flomax -Bladder scan showed 537 ml of urine --> placed foley cath  Essential hypertension -IV hydralazine as needed -Hold lisinopril due to soft blood pressure  HLD (hyperlipidemia) -hold Lipitor today due to elevated CK 884  Depression: -Cymbalta  Abnormal chest x-ray findings: CXR showed significant RIGHT perihilar opacity, this may reflect consolidation but underlying mass and adenopathy are not excluded. -pt must f/u with PCP and repeat CXR or get Ct-chest after his aspiration pneumonia resolves     DVT ppx: SQ Lovenox Code Status: Full code Family Communication: Yes, patient's girlfriend at bed side Disposition Plan:  Anticipate discharge  back to previous environment Consults called:  none Admission status and Level of care: Med-Surg:     as inpt    Status is: Inpatient  Remains inpatient appropriate because:Inpatient level of care appropriate due to severity of illness   Dispo: The patient is from: Home              Anticipated d/c is to: Home              Patient currently is not medically stable to d/c.   Difficult to place patient No          Date of Service 02/19/2021    Ivor Costa [facility]   If 7PM-7AM, please contact night-coverage [email or web address] 02/19/2021, 2:28 PM

## 2021-02-19 NOTE — Consult Note (Signed)
Pharmacy Antibiotic Note  Anthony Nguyen is a 65 y.o. male w/ h/o HTN, GERD, h/o diverticulitis, & chronic pain (Lumbar DDD) admitted on 02/19/2021 with AMS & c/f aspiration pneumonia.  Pharmacy has been consulted for Unasyn dosing.  Imaging: 5/4 CT chest: RUL infiltrate c/w pneumonia. 5/4 CT head: negative for any acute abnormalities.  Plan: Received Zosyn 3.375g x1 in ED; initiate at next dosing interval starting Unasyn 3g q6h per current renal function. Will CTM with labs and cultures.  Height: 6\' 1"  (185.4 cm) Weight: 108.7 kg (239 lb 10.2 oz) IBW/kg (Calculated) : 79.9  Temp (24hrs), Avg:99.3 F (37.4 C), Min:98.9 F (37.2 C), Max:99.6 F (37.6 C)  Recent Labs  Lab 02/19/21 1030  WBC 15.4*  CREATININE 1.17  LATICACIDVEN 1.9    Estimated Creatinine Clearance: 84.6 mL/min (by C-G formula based on SCr of 1.17 mg/dL).    Allergies  Allergen Reactions  . Shellfish Allergy Swelling    Oysters and fish last time he consumed caused swelling in the bottom legs     Antimicrobials this admission: Zosyn x1 (5/4); Unasyn (5/4 >>   Dose adjustments this admission: N/a CTM  Microbiology results: 5/4 BCx: sent/pending 5/4: Covid/Flu negative  Thank you for allowing pharmacy to be a part of this patient's care.  Anthony Nguyen 02/19/2021 12:12 PM

## 2021-02-19 NOTE — Progress Notes (Signed)
16 fr. Foley catheter inserted. Lot [id number]. 700 cc output. Patient tolerated well. Urine sent to lab.

## 2021-02-19 NOTE — ED Provider Notes (Signed)
[facility] Emergency Department Provider Note   ____________________________________________   Event Date/Time   First MD Initiated Contact with Patient 02/19/21 1024     (approximate)  I have reviewed the triage vital signs and the nursing notes.   HISTORY  Chief Complaint Altered Mental Status    HPI Anthony Nguyen is a 65 y.o. male with past medical history of hypertension, GERD, and chronic pain syndrome who presents to the ED for altered mental status.  History is limited due to patient's somnolence and confusion.  Per EMS, family reported that patient has become progressively sleepy and confused with slurred speech since last night.  Speaking with his girlfriend over the phone, patient seemed sleepier than usual last night and had fallen asleep on the couch around 4 AM.  She then woke up this morning to find him face down on the ground, slurring his speech and more confused than earlier.  He does not deal with any confusion at baseline.  Girlfriend is concerned that he may be taking more pain medication than prescribed as he has been dealing with worsening back pain and has surgery scheduled later this month.  Patient somnolent with slurred speech on my evaluation, unable to provide any history.        Past Medical History:  Diagnosis Date  . Allergy   . Arthritis    osteoarthritis  . Cancer ([place])    melanoma skin cancer face  . Chronic back pain    goes to Dr. Holley Raring at the pain clinic  . Current every day smoker   . Diverticulosis   . GERD (gastroesophageal reflux disease)   . H/O diverticulitis of colon   . Restless leg syndrome   . Sciatic pain     Patient Active Problem List   Diagnosis Date Noted  . Essential hypertension 11/28/2018  . Benign prostatic hyperplasia with urinary frequency 04/04/2018  . Lumbar radiculopathy 12/29/2017  . Lumbar degenerative disc disease 12/29/2017  . Lumbar foraminal stenosis 12/29/2017  .  History of left knee replacement 12/29/2017  . Primary osteoarthritis of both knees 12/29/2017  . Chronic pain syndrome 12/29/2017  . Primary osteoarthritis of left knee 10/14/2017  . Chronic nasal congestion 11/24/2013  . Benign paroxysmal positional vertigo 11/24/2013  . Sleep disturbance 08/17/2013  . Allergy 08/17/2013  . GERD (gastroesophageal reflux disease) 08/17/2013  . Tobacco abuse counseling 08/17/2013  . Ringworm 08/17/2013    Past Surgical History:  Procedure Laterality Date  . JOINT REPLACEMENT Left 10/14/2018   knee  . skin cancer removal     face  . TONSILLECTOMY AND ADENOIDECTOMY    . TOTAL KNEE ARTHROPLASTY Left 10/14/2017   Procedure: TOTAL KNEE ARTHROPLASTY;  Surgeon: Hessie Knows, MD;  Location: [facility] ORS;  Service: Orthopedics;  Laterality: Left;    Prior to Admission medications   Medication Sig Start Date End Date Taking? Authorizing Provider  albuterol (VENTOLIN HFA) 108 (90 Base) MCG/ACT inhaler Inhale into the lungs. 11/16/14   [provider]  atorvastatin (LIPITOR) 40 MG tablet Take 1 tablet by mouth daily. 03/21/20 03/21/21  [provider]  Diclofenac Sodium 3 % GEL Place 1 application onto the skin 3 (three) times daily as needed (left knee pain). 08/09/19   Mikey College, NP  DULoxetine (CYMBALTA) 30 MG capsule Take by mouth. 03/19/20 03/19/21  [provider]  gabapentin (NEURONTIN) 600 MG tablet Take 600 mg by mouth 3 (three) times daily.    [provider]  lisinopril (ZESTRIL) 5 MG tablet Take 5 mg by mouth daily.    [provider]  nicotine (NICODERM CQ - DOSED IN MG/24 HOURS) 21 mg/24hr patch Place onto the skin. 03/29/20   [provider]  Oxycodone HCl 10 MG TABS Take 1 tablet (10 mg total) by mouth 3 (three) times daily as needed. 02/15/21 03/17/21  [name], MD  silodosin (RAPAFLO) 8 MG CAPS capsule SMARTSIG:1 Capsule(s) By Mouth Every Evening 01/13/20   [provider]   tiZANidine (ZANAFLEX) 4 MG tablet Take 1 tablet (4 mg total) by mouth every 8 (eight) hours as needed for muscle spasms. 01/14/21   [name], MD    Allergies Shellfish allergy  Family History  Problem Relation Age of Onset  . Hypertension Mother   . Alzheimer's disease Father   . Prostate cancer Father 47  . Cancer Sister 64       breast cancer  . Diabetes Sister   . Heart disease Brother        electrical pathways - ongoing workup  . Healthy Brother   . Healthy Son   . Healthy Son   . Healthy Son   . Healthy Son     Social History Social History   Tobacco Use  . Smoking status: Current Every Day Smoker    Packs/day: 2.00    Years: 30.00    Pack years: 60.00    Types: Cigarettes  . Smokeless tobacco: Current User  Vaping Use  . Vaping Use: Never used  Substance Use Topics  . Alcohol use: No  . Drug use: No    Review of Systems Unable to obtain secondary to altered mental status  ____________________________________________   PHYSICAL EXAM:  VITAL SIGNS: ED Triage Vitals  Enc Vitals Group     BP      Pulse      Resp      Temp      Temp src      SpO2      Weight      Height      Head Circumference      Peak Flow      Pain Score      Pain Loc      Pain Edu?      Excl. in [place]?     Constitutional: Somnolent but arousable to voice, oriented to person only. Eyes: Conjunctivae are normal.  Pupils equal round and reactive to light bilaterally. Head: Atraumatic. Nose: No congestion/rhinnorhea. Mouth/Throat: Mucous membranes are moist.  Dried emesis around mouth. Neck: Normal ROM Cardiovascular: Tachycardic, regular rhythm. Grossly normal heart sounds. Respiratory: Normal respiratory effort.  No retractions. Lungs CTAB. Gastrointestinal: Soft and nontender. No distention. Genitourinary: deferred Musculoskeletal: No lower extremity tenderness, 1+ pitting edema to knees bilaterally. Neurologic: Slurred speech noted. No gross focal neurologic  deficits are appreciated, patient moves all 4 extremities equally with encouragement. Skin:  Skin is warm, dry and intact. No rash noted. Psychiatric: Mood and affect are normal. Speech and behavior are normal.  ____________________________________________   LABS (all labs ordered are listed, but only abnormal results are displayed)  Labs Reviewed  CBC WITH DIFFERENTIAL/PLATELET - Abnormal; Notable for the following components:      Result Value   WBC 15.4 (*)    RBC 3.97 (*)    Hemoglobin 12.2 (*)    HCT 37.0 (*)    Neutro Abs 13.4 (*)    Monocytes Absolute 1.2 (*)    Abs Immature Granulocytes 0.10 (*)  All other components within normal limits  COMPREHENSIVE METABOLIC PANEL - Abnormal; Notable for the following components:   Glucose, Bld 142 (*)    Calcium 8.7 (*)    All other components within normal limits  SALICYLATE LEVEL - Abnormal; Notable for the following components:   Salicylate Lvl [id number] (*)    All other components within normal limits  ACETAMINOPHEN LEVEL - Abnormal; Notable for the following components:   Acetaminophen (Tylenol), Serum <10 (*)    All other components within normal limits  BLOOD GAS, VENOUS - Abnormal; Notable for the following components:   pO2, Ven 59.0 (*)    Bicarbonate 28.9 (*)    All other components within normal limits  CK - Abnormal; Notable for the following components:   Total CK 884 (*)    All other components within normal limits  RESP PANEL BY RT-PCR (FLU A&B, COVID) ARPGX2  CULTURE, BLOOD (ROUTINE X 2)  CULTURE, BLOOD (ROUTINE X 2)  LACTIC ACID, PLASMA  ETHANOL  LACTIC ACID, PLASMA  URINALYSIS, COMPLETE (UACMP) WITH MICROSCOPIC  URINE DRUG SCREEN, QUALITATIVE ([facility] ONLY)   ____________________________________________  EKG  ED ECG REPORT I, Blake Divine, the attending physician, personally viewed and interpreted this ECG.   Date: 02/19/2021  EKG Time: 10:33  Rate: 115  Rhythm: sinus tachycardia  Axis: Normal   Intervals:none  ST&T Change: None   PROCEDURES  Procedure(s) performed (including Critical Care):  .Critical Care Performed by: Blake Divine, MD Authorized by: Blake Divine, MD   Critical care provider statement:    Critical care time (minutes):  45   Critical care time was exclusive of:  Separately billable procedures and treating other patients and teaching time   Critical care was necessary to treat or prevent imminent or life-threatening deterioration of the following conditions:  Respiratory failure   Critical care was time spent personally by me on the following activities:  Discussions with consultants, evaluation of patient's response to treatment, examination of patient, ordering and performing treatments and interventions, ordering and review of laboratory studies, ordering and review of radiographic studies, pulse oximetry, re-evaluation of patient's condition, obtaining history from patient or surrogate and review of old charts   I assumed direction of critical care for this patient from another provider in my specialty: no     Care discussed with: admitting provider       ____________________________________________   INITIAL IMPRESSION / [place] / ED COURSE       65 year old male with past medical history of hypertension, GERD, and chronic pain syndrome who presents to the ED with increasing somnolence and confusion since last night.  Patient noted to be tachycardic with room air sats in the low 80s, subsequently placed on 4 L nasal cannula with improvement.  It seems likely that patient has been taking more pain medication than prescribed and we will trial dose of Narcan.  Given emesis around his mouth, but also consider aspiration.  We will screen electrolytes and CK level, hydrate with IV fluids.  Given his tachycardia, also assess for sepsis with blood cultures and lactate as well as chest x-ray and UA.  CT head is negative for acute process, chest  x-ray reviewed by me and shows right-sided infiltrates concerning for aspiration pneumonia.  We will start patient on Zosyn for this and continue IV fluid hydration given his mildly elevated CK.  Patient currently maintaining O2 sats on 3 L nasal cannula, he appeared to have partial response to Narcan but is again  quite somnolent.  Case discussed with hospitalist for admission.      ____________________________________________   FINAL CLINICAL IMPRESSION(S) / ED DIAGNOSES  Final diagnoses:  Acute respiratory failure with hypoxia (HCC)  Aspiration pneumonia of right upper lobe, unspecified aspiration pneumonia type ([place])  Altered mental status, unspecified altered mental status type  Opiate overdose, accidental or unintentional, initial encounter [facility])     ED Discharge Orders    None       Note:  This document was prepared using Dragon voice recognition software and may include unintentional dictation errors.   Blake Divine, MD 02/19/21 1209

## 2021-02-19 NOTE — Progress Notes (Signed)
Patient confused, unable to complete admission documentation at this time.

## 2021-02-19 NOTE — Progress Notes (Signed)
Patient bladder scanned, showing 537cc. Patient confused but stating he has to go but at this point hasn't.  external urinary catheter on him due to confusion with using the urinal properly. MD notified and made aware. Orders given.

## 2021-02-20 DIAGNOSIS — J690 Pneumonitis due to inhalation of food and vomit: Secondary | ICD-10-CM

## 2021-02-20 LAB — BLOOD CULTURE ID PANEL (REFLEXED) - BCID2

## 2021-02-20 LAB — BASIC METABOLIC PANEL
Anion gap: 6 (ref 5–15)
BUN: 15 mg/dL (ref 8–23)
CO2: 29 mmol/L (ref 22–32)
Calcium: 8.4 mg/dL — ABNORMAL LOW (ref 8.9–10.3)
Chloride: 106 mmol/L (ref 98–111)
Creatinine, Ser: 0.76 mg/dL (ref 0.61–1.24)
GFR, Estimated: >60 mL/min (ref >60)
Glucose, Bld: 91 mg/dL (ref 70–99)
Potassium: 3.7 mmol/L (ref 3.5–5.1)
Sodium: 141 mmol/L (ref 135–145)

## 2021-02-20 LAB — HIV ANTIBODY (ROUTINE TESTING W REFLEX): HIV Screen 4th Generation wRfx: NONREACTIVE

## 2021-02-20 LAB — CBC
HCT: 30 % — ABNORMAL LOW (ref 39.0–52.0)
Hemoglobin: 9.5 g/dL — ABNORMAL LOW (ref 13.0–17.0)
MCH: 30.1 pg (ref 26.0–34.0)
MCHC: 31.7 g/dL (ref 30.0–36.0)
MCV: 94.9 fL (ref 80.0–100.0)
Platelets: 134 10*3/uL — ABNORMAL LOW (ref 150–400)
RBC: 3.16 MIL/uL — ABNORMAL LOW (ref 4.22–5.81)
RDW: 12.9 % (ref 11.5–15.5)
WBC: 6.7 10*3/uL (ref 4.0–10.5)
nRBC: 0 % (ref 0.0–0.2)

## 2021-02-20 LAB — CK: Total CK: 3193 U/L — ABNORMAL HIGH (ref 49–397)

## 2021-02-20 MED ORDER — CHLORHEXIDINE GLUCONATE CLOTH 2 % EX PADS
6.0000 | MEDICATED_PAD | Freq: Every day | CUTANEOUS | Status: DC
  Administered 2021-02-20 – 2021-02-21 (×2): 6 via TOPICAL

## 2021-02-20 MED ORDER — DULOXETINE HCL 30 MG PO CPEP
30.0000 mg | ORAL_CAPSULE | Freq: Every day | ORAL | Status: DC
  Administered 2021-02-20 – 2021-02-22 (×3): 30 mg via ORAL
  Filled 2021-02-20 (×3): qty 1

## 2021-02-20 NOTE — Progress Notes (Signed)
PHARMACY - PHYSICIAN COMMUNICATION CRITICAL VALUE ALERT - BLOOD CULTURE IDENTIFICATION (BCID)  Anthony Nguyen is an 65 y.o. male who presented to [facility] on 02/19/2021 with a chief complaint of AMS  Assessment:  Blood culture with GPC in aerobic bottle of BOTH sets, BCID = MSSE.  CXR concerning for possible pneumonia.    Name of physician (or Provider) Contacted: Dr Maren Beach    Current antibiotics: ampicillin/sulbactam  Changes to prescribed antibiotics recommended:  Patient is on recommended antibiotics - No changes needed - continue current abx for aspiration pneumonia as has MSSE activity.  Await final cultures  Results for orders placed or performed during the hospital encounter of 02/19/21  Blood Culture ID Panel (Reflexed) (Collected: 02/19/2021 10:30 AM)  Result Value Ref Range   Enterococcus faecalis NOT DETECTED NOT DETECTED   Enterococcus Faecium NOT DETECTED NOT DETECTED   Listeria monocytogenes NOT DETECTED NOT DETECTED   Staphylococcus species DETECTED (A) NOT DETECTED   Staphylococcus aureus (BCID) NOT DETECTED NOT DETECTED   Staphylococcus epidermidis DETECTED (A) NOT DETECTED   Staphylococcus lugdunensis NOT DETECTED NOT DETECTED   Streptococcus species NOT DETECTED NOT DETECTED   Streptococcus agalactiae NOT DETECTED NOT DETECTED   Streptococcus pneumoniae NOT DETECTED NOT DETECTED   Streptococcus pyogenes NOT DETECTED NOT DETECTED   A.calcoaceticus-baumannii NOT DETECTED NOT DETECTED   Bacteroides fragilis NOT DETECTED NOT DETECTED   Enterobacterales NOT DETECTED NOT DETECTED   Enterobacter cloacae complex NOT DETECTED NOT DETECTED   Escherichia coli NOT DETECTED NOT DETECTED   Klebsiella aerogenes NOT DETECTED NOT DETECTED   Klebsiella oxytoca NOT DETECTED NOT DETECTED   Klebsiella pneumoniae NOT DETECTED NOT DETECTED   Proteus species NOT DETECTED NOT DETECTED   Salmonella species NOT DETECTED NOT DETECTED   Serratia marcescens NOT DETECTED NOT DETECTED    Haemophilus influenzae NOT DETECTED NOT DETECTED   Neisseria meningitidis NOT DETECTED NOT DETECTED   Pseudomonas aeruginosa NOT DETECTED NOT DETECTED   Stenotrophomonas maltophilia NOT DETECTED NOT DETECTED   Candida albicans NOT DETECTED NOT DETECTED   Candida auris NOT DETECTED NOT DETECTED   Candida glabrata NOT DETECTED NOT DETECTED   Candida krusei NOT DETECTED NOT DETECTED   Candida parapsilosis NOT DETECTED NOT DETECTED   Candida tropicalis NOT DETECTED NOT DETECTED   Cryptococcus neoformans/gattii NOT DETECTED NOT DETECTED   Methicillin resistance mecA/C NOT DETECTED NOT DETECTED    Doreene Eland, PharmD, BCPS.   Work Cell: [phone] 02/20/2021 8:15 AM

## 2021-02-20 NOTE — Progress Notes (Signed)
PROGRESS NOTE    Anthony Nguyen  [id number] DOB: 1956/01/12 DOA: 02/19/2021 PCP: Nelle Don, MD   Chief Complaint  Patient presents with  . Altered Mental Status  Brief Narrative: 65 year old male with history of hypertension, HLD, GERD, depression, RLS, diverticulitis history, melanoma, smoker, BPPV, BPH, chronic pain syndrome on oxycodone presented with altered mental status. As per the report  " patient was found to be confused, somnolent, sleepier since last night (02/18/21). He has slurred speech. He had fallen asleep on the couch around 4 AM and then woke in morning to find him face down on the ground. He has some blood around his nose and month.  In ED was confused, lethargic, somnolent, but arousable- Girlfriend is concerned that he may be taking more pain medication. He possibly took 7-8 pills of Oxycodone due to worsening back pain.  He has dry cough, his oxygen saturation 90% on room air, which improved to 97% of 3 L oxygen. No fever. No chest pain. Per his girlfriend, pt is not using oxygen normally at home. Pt has possible OSA per his girl friend.  ED Course: pt was found to have WBC 15.4, pending BMP, negative COVID PCR, alcohol level less than 10, CK8 84, salicylate level less than 7, lactic acid level 1.9, GFR>60, temperature 99.6, softer blood pressure, tachycardia with heart rate of 115, RR 18, chest x-ray showed right upper lobe infiltration and right perihilar opacity.  CT of head is negative for acute intracranial abnormalities.  Patient is admitted to [place] bed as inpatient  Subjective: Seen this morning.  He is alert awake oriented x3.  He remembers waking up in the hospital but has recollection.  He is oriented and at baseline this morning on 2 L nasal cannula. He admits taking pain medication " meds may have been mixed up"   Assessment & Plan:  Aspiration pneumonia in the setting of altered mental status: Sepsis due to #1: Aerobic bottle of both sets w/  BCID= MSSE Acute hypoxic respiratory failure due to #1-SPO2 88% on room air in the ED Currently vital stable mentation improved.  Leukocytosis resolved.  Wean oxygen as tolerated.  Continue Unasyn.  Monitor respiratory status.  Pro-Cal slightly up 0.4.  Continue Mucinex, bronchodilator.  Allow p.o. once safe with bedside swallow eval Recent Labs  Lab 02/19/21 1029 02/19/21 1030 02/20/21 0413  WBC  --  15.4* 6.7  LATICACIDVEN  --  1.9  --   PROCALCITON 0.43  --   --    Acute metabolic encephalopathy, suspected pain medication related.  Received Narcan in the ED CT head no acute finding, nonfocal on exam.  UDS positive for opiate and benzo.  Mental status at baseline this morning  Rhabdomyolysis: Likely from laying on the floor.  Continue IV hydration hold statin monitor CK level  Right perihilar opacity chest x-ray no acute consolidation but underlying mass cannot exclude same repeat x-ray/CT chest after pneumonia treatment  Chronic pain syndrome: On chronic oxycodone holding narcotics continue Neurontin, resume hsi pian meds slowly.  BPH W/ urinary frequency: Continue his Flomax, bladder scan  537 ml in ED-will do voiding trial  Essential hypertension:BP is controlled.  Lisinopril on hold for now.  Depression continue Cymbalta  HLD: Holding statin due to rhabdomyolysis  Morbid obesity BMI 31.  Will benefit with weight loss in the lifestyle.  Diet Order            Diet regular Room service appropriate? Yes; Fluid consistency: Thin  Diet  effective now                He does take pain medication regularly Body mass index is 31.62 kg/m. DVT prophylaxis: Lovenox Code Status:   Code Status: Full Code  Family Communication: plan of care discussed with patient at bedside.  Status is: Inpatient  Remains inpatient appropriate because:IV treatments appropriate due to intensity of illness or inability to take PO and Inpatient level of care appropriate due to severity of  illness  Dispo: The patient is from: Home              Anticipated d/c is to: Home              Patient currently is not medically stable to d/c.   Difficult to place patient No  Unresulted Labs (From admission, onward)          Start     Ordered   02/22/21 [id number]  Basic metabolic panel  Daily,   R     Question:  Specimen collection method  Answer:  Lab=Lab collect   02/20/21 0750   02/21/21 0500  CK  Daily,   R      02/20/21 0749   02/21/21 0500  Comprehensive metabolic panel  Tomorrow morning,   R       Question:  Specimen collection method  Answer:  Lab=Lab collect   02/20/21 0750   02/21/21 0500  CBC  Daily,   R     Question:  Specimen collection method  Answer:  Lab=Lab collect   02/20/21 0750   02/19/21 1415  Culture, sputum-assessment  Once,   STAT        02/19/21 1414         Medications reviewed:  Scheduled Meds: . Chlorhexidine Gluconate Cloth  6 each Topical Daily  . DULoxetine  30 mg Oral Daily  . enoxaparin (LOVENOX) injection  0.5 mg/kg Subcutaneous Q24H  . gabapentin  600 mg Oral TID  . nicotine  21 mg Transdermal Daily  . tamsulosin  0.4 mg Oral QPC supper   Continuous Infusions: . ampicillin-sulbactam (UNASYN) IV 3 g (02/20/21 0829)  . lactated ringers 125 mL/hr at 02/20/21 0834    Consultants:see note  Procedures:see note  Antimicrobials: Anti-infectives (From admission, onward)   Start     Dose/Rate Route Frequency Ordered Stop   02/19/21 2000  Ampicillin-Sulbactam (UNASYN) 3 g in sodium chloride 0.9 % 100 mL IVPB        3 g 200 mL/hr over 30 Minutes Intravenous Every 6 hours 02/19/21 1211     02/19/21 1145  piperacillin-tazobactam (ZOSYN) IVPB 3.375 g        3.375 g 100 mL/hr over 30 Minutes Intravenous  Once 02/19/21 1133 02/19/21 1247     Culture/Microbiology    Component Value Date/Time   SDES BLOOD RIGHT ANTECUBITAL 02/19/2021 1030   SPECREQUEST  02/19/2021 1030    BOTTLES DRAWN AEROBIC AND ANAEROBIC Blood Culture adequate volume    CULT GRAM POSITIVE COCCI 02/19/2021 1030   REPTSTATUS PENDING 02/19/2021 1030    Other culture-see note  Objective: Vitals: Today's Vitals   02/20/21 0424 02/20/21 0600 02/20/21 0827 02/20/21 1130  BP: 114/63  (!) 141/74 (!) 144/73  Pulse: 78  75 82  Resp: 20  16 18   Temp: 97.9 F (36.6 C)  97.8 F (36.6 C) 97.9 F (36.6 C)  TempSrc: Oral  Oral Oral  SpO2: 99%  100% 100%  Weight:  Height:      PainSc:  0-No pain      Intake/Output Summary (Last 24 hours) at 02/20/2021 1257 Last data filed at 02/20/2021 1200 Gross per 24 hour  Intake 2113.31 ml  Output 1950 ml  Net 163.31 ml   Filed Weights   02/19/21 1031  Weight: 108.7 kg   Weight change:   Intake/Output from previous day: 05/04 0701 - 05/05 0700 In: 2113.3 [I.V.:63.3; IV Piggyback:2050] Out: 1050 [Urine:1050] Intake/Output this shift: Total I/O In: -  Out: 900 [Urine:900] Filed Weights   02/19/21 1031  Weight: 108.7 kg    Examination: General exam: AAO x3, older than stated age, weak appearing. HEENT:Oral mucosa moist, Ear/Nose WNL grossly,dentition normal. Respiratory system: bilaterally diminished, no crackles no use of accessory muscle, non tender. Cardiovascular system: S1 & S2 +, regular no JVD. Gastrointestinal system: Abdomen soft, NT,ND, BS+. Nervous System:Alert, awake, moving extremities Extremities: No edema, distal peripheral pulses palpable.  Skin: No rashes,no icterus. MSK: Normal muscle bulk,tone, power  Data Reviewed: I have personally reviewed following labs and imaging studies CBC: Recent Labs  Lab 02/19/21 1030 02/20/21 0413  WBC 15.4* 6.7  NEUTROABS 13.4*  --   HGB 12.2* 9.5*  HCT 37.0* 30.0*  MCV 93.2 94.9  PLT 189 353*   Basic Metabolic Panel: Recent Labs  Lab 02/19/21 1030 02/20/21 0413  NA 138 141  K 4.6 3.7  CL 103 106  CO2 24 29  GLUCOSE 142* 91  BUN 20 15  CREATININE 1.17 0.76  CALCIUM 8.7* 8.4*   GFR: Estimated Creatinine Clearance: 123.8 mL/min (by  C-G formula based on SCr of 0.76 mg/dL). Liver Function Tests: Recent Labs  Lab 02/19/21 1030  AST 32  ALT 26  ALKPHOS 70  BILITOT 0.8  PROT 7.1  ALBUMIN 4.2   No results for input(s): LIPASE, AMYLASE in the last 168 hours. No results for input(s): AMMONIA in the last 168 hours. Coagulation Profile: No results for input(s): INR, PROTIME in the last 168 hours. Cardiac Enzymes: Recent Labs  Lab 02/19/21 1030 02/20/21 0413  CKTOTAL 884* 3,193*   BNP (last 3 results) No results for input(s): PROBNP in the last 8760 hours. HbA1C: No results for input(s): HGBA1C in the last 72 hours. CBG: No results for input(s): GLUCAP in the last 168 hours. Lipid Profile: No results for input(s): CHOL, HDL, LDLCALC, TRIG, CHOLHDL, LDLDIRECT in the last 72 hours. Thyroid Function Tests: No results for input(s): TSH, T4TOTAL, FREET4, T3FREE, THYROIDAB in the last 72 hours. Anemia Panel: No results for input(s): VITAMINB12, FOLATE, FERRITIN, TIBC, IRON, RETICCTPCT in the last 72 hours. Sepsis Labs: Recent Labs  Lab 02/19/21 1029 02/19/21 1030  PROCALCITON 0.43  --   LATICACIDVEN  --  1.9    Recent Results (from the past 240 hour(s))  Culture, blood (routine x 2)     Status: None (Preliminary result)   Collection Time: 02/19/21 10:29 AM   Specimen: BLOOD  Result Value Ref Range Status   Specimen Description BLOOD BLOOD RIGHT HAND  Final   Special Requests   Final    BOTTLES DRAWN AEROBIC AND ANAEROBIC Blood Culture adequate volume   Culture  Setup Time   Final    GRAM POSITIVE COCCI AEROBIC BOTTLE ONLY CRITICAL VALUE NOTED.  VALUE IS CONSISTENT WITH PREVIOUSLY REPORTED AND CALLED VALUE. Performed at [facility], [address] [place],  [address]    Culture [facility] POSITIVE COCCI  Final   Report Status PENDING  Incomplete  Culture, blood (routine x 2)     Status: None (Preliminary result)   Collection Time: 02/19/21 10:30 AM   Specimen: BLOOD  Result Value Ref  Range Status   Specimen Description BLOOD RIGHT ANTECUBITAL  Final   Special Requests   Final    BOTTLES DRAWN AEROBIC AND ANAEROBIC Blood Culture adequate volume   Culture  Setup Time   Final    Organism ID to follow GRAM POSITIVE COCCI AEROBIC BOTTLE ONLY CRITICAL RESULT CALLED TO, READ BACK BY AND VERIFIED WITH: ALEX CHAPPELL AT [place] ON 02/20/2021 [place]. Performed at [facility], [address] [place], [place] [address]    Culture [facility] POSITIVE COCCI  Final   Report Status PENDING  Incomplete  Blood Culture ID Panel (Reflexed)     Status: Abnormal   Collection Time: 02/19/21 10:30 AM  Result Value Ref Range Status   Enterococcus faecalis NOT DETECTED NOT DETECTED Final   Enterococcus Faecium NOT DETECTED NOT DETECTED Final   Listeria monocytogenes NOT DETECTED NOT DETECTED Final   Staphylococcus species DETECTED (A) NOT DETECTED Final    Comment: CRITICAL RESULT CALLED TO, READ BACK BY AND VERIFIED WITH: ALEX CHAPPELL AT 3329 ON 02/20/2021 [place].    Staphylococcus aureus (BCID) NOT DETECTED NOT DETECTED Final   Staphylococcus epidermidis DETECTED (A) NOT DETECTED Final    Comment: CRITICAL RESULT CALLED TO, READ BACK BY AND VERIFIED WITH: ALEX CHAPPELL AT 5188 ON 02/20/2021 [place].    Staphylococcus lugdunensis NOT DETECTED NOT DETECTED Final   Streptococcus species NOT DETECTED NOT DETECTED Final   Streptococcus agalactiae NOT DETECTED NOT DETECTED Final   Streptococcus pneumoniae NOT DETECTED NOT DETECTED Final   Streptococcus pyogenes NOT DETECTED NOT DETECTED Final   A.calcoaceticus-baumannii NOT DETECTED NOT DETECTED Final   Bacteroides fragilis NOT DETECTED NOT DETECTED Final   Enterobacterales NOT DETECTED NOT DETECTED Final   Enterobacter cloacae complex NOT DETECTED NOT DETECTED Final   Escherichia coli NOT DETECTED NOT DETECTED Final   Klebsiella aerogenes NOT DETECTED NOT DETECTED Final   Klebsiella oxytoca NOT DETECTED NOT DETECTED Final   Klebsiella pneumoniae  NOT DETECTED NOT DETECTED Final   Proteus species NOT DETECTED NOT DETECTED Final   Salmonella species NOT DETECTED NOT DETECTED Final   Serratia marcescens NOT DETECTED NOT DETECTED Final   Haemophilus influenzae NOT DETECTED NOT DETECTED Final   Neisseria meningitidis NOT DETECTED NOT DETECTED Final   Pseudomonas aeruginosa NOT DETECTED NOT DETECTED Final   Stenotrophomonas maltophilia NOT DETECTED NOT DETECTED Final   Candida albicans NOT DETECTED NOT DETECTED Final   Candida auris NOT DETECTED NOT DETECTED Final   Candida glabrata NOT DETECTED NOT DETECTED Final   Candida krusei NOT DETECTED NOT DETECTED Final   Candida parapsilosis NOT DETECTED NOT DETECTED Final   Candida tropicalis NOT DETECTED NOT DETECTED Final   Cryptococcus neoformans/gattii NOT DETECTED NOT DETECTED Final   Methicillin resistance mecA/C NOT DETECTED NOT DETECTED Final    Comment: Performed at [facility], [address] [place], [place] [address]  Resp Panel by RT-PCR (Flu A&B, Covid) Nasopharyngeal Swab     Status: None   Collection Time: 02/19/21 10:38 AM   Specimen: Nasopharyngeal Swab; Nasopharyngeal(NP) swabs in vial transport medium  Result Value Ref Range Status   SARS Coronavirus 2 by RT PCR NEGATIVE NEGATIVE Final    Comment: (NOTE) SARS-CoV-2 target nucleic acids are NOT DETECTED.  The SARS-CoV-2 RNA is generally detectable in upper respiratory specimens during the acute phase of infection. The  lowest concentration of SARS-CoV-2 viral copies this assay can detect is 138 copies/mL. A negative result does not preclude SARS-Cov-2 infection and should not be used as the sole basis for treatment or other patient management decisions. A negative result may occur with  improper specimen collection/handling, submission of specimen other than nasopharyngeal swab, presence of viral mutation(s) within the areas targeted by this assay, and inadequate number of viral copies(<138 copies/mL). A  negative result must be combined with clinical observations, patient history, and epidemiological information. The expected result is Negative.  Fact Sheet for Patients:  EntrepreneurPulse.com.au  Fact Sheet for Healthcare Providers:  IncredibleEmployment.be  This test is no t yet approved or cleared by the [place] FDA and  has been authorized for detection and/or diagnosis of SARS-CoV-2 by FDA under an Emergency Use Authorization (EUA). This EUA will remain  in effect (meaning this test can be used) for the duration of the COVID-19 declaration under Section 564(b)(1) of the Act, 21 U.S.C.section 360bbb-3(b)(1), unless the authorization is terminated  or revoked sooner.       Influenza A by PCR NEGATIVE NEGATIVE Final   Influenza B by PCR NEGATIVE NEGATIVE Final    Comment: (NOTE) The Xpert Xpress SARS-CoV-2/FLU/RSV plus assay is intended as an aid in the diagnosis of influenza from Nasopharyngeal swab specimens and should not be used as a sole basis for treatment. Nasal washings and aspirates are unacceptable for Xpert Xpress SARS-CoV-2/FLU/RSV testing.  Fact Sheet for Patients: EntrepreneurPulse.com.au  Fact Sheet for Healthcare Providers: IncredibleEmployment.be  This test is not yet approved or cleared by the [place] FDA and has been authorized for detection and/or diagnosis of SARS-CoV-2 by FDA under an Emergency Use Authorization (EUA). This EUA will remain in effect (meaning this test can be used) for the duration of the COVID-19 declaration under Section 564(b)(1) of the Act, 21 U.S.C. section 360bbb-3(b)(1), unless the authorization is terminated or revoked.  Performed at [facility], [address], [place], [place] [address]      Radiology Studies: CT Head Wo Contrast  Result Date: 02/19/2021 CLINICAL DATA:  Mental status change since last night. Difficulty  communicating. EXAM: CT HEAD WITHOUT CONTRAST TECHNIQUE: Contiguous axial images were obtained from the base of the skull through the vertex without intravenous contrast. COMPARISON:  CT head 04/18/2019. FINDINGS: Brain: There is no evidence of acute intracranial hemorrhage, mass lesion, brain edema or extra-axial fluid collection. The ventricles and subarachnoid spaces are appropriately sized for age. There is no CT evidence of acute cortical infarction. Vascular: Mild intracranial atherosclerosis. No hyperdense vessel identified. Skull: Negative for fracture or focal lesion. Sinuses/Orbits: Mild mucosal thickening in the paranasal sinuses, similar to previous study. No air-fluid levels identified. The mastoid air cells and middle ears are clear. No orbital abnormalities are seen. Other: None. IMPRESSION: 1. Stable CT of the head.  No acute intracranial findings. 2. Mild paranasal sinus disease, similar to previous CT. Electronically Signed   By: Richardean Sale M.D.   On: 02/19/2021 11:30   DG Chest Portable 1 View  Result Date: 02/19/2021 CLINICAL DATA:  Weakness, altered mental status; history of melanoma, GERD, smoker EXAM: PORTABLE CHEST 1 VIEW COMPARISON:  Portable exam 1034 hours compared to 05/12/2019 FINDINGS: Normal heart size, mediastinal contours, and pulmonary vascularity. RIGHT perihilar opacity extending in the RIGHT upper lobe, favor consolidation but cannot exclude underlying mass/adenopathy. Remaining lungs clear. No pleural effusion or pneumothorax. Osseous structures unremarkable. IMPRESSION: RIGHT upper lobe infiltrate consistent with pneumonia. Significant RIGHT perihilar  opacity may reflect consolidation but underlying mass and adenopathy are not excluded; either radiographic follow-up until resolution or CT imaging recommended to exclude tumor and adenopathy. Electronically Signed   By: Lavonia Dana M.D.   On: 02/19/2021 11:15     LOS: 1 day   Antonieta Pert, MD [facility]  02/20/2021, 12:57 PM

## 2021-02-20 NOTE — Progress Notes (Signed)
Mobility Specialist - Progress Note   02/20/21 1600  Mobility  Activity Ambulated in hall  Level of Assistance Independent after set-up  Assistive Device None  Distance Ambulated (ft) 800 ft  Mobility Response Tolerated well  Mobility performed by Mobility specialist  $Mobility charge 1 Mobility    Pre-mobility: 80 HR, 96% SpO2 During mobility: 106 HR, 94% SpO2 Post-mobility: 94 HR, 97% SpO2   Pt ambulated in hallway without AD. Independent with mobility tech managing cords/IV. No LOB. Denied dizziness. Denied SOB on RA, O2 maintained mid-high 90s. Max HR 106 bpm. Denied pain.    Kathee Delton Mobility Specialist 02/20/21, 4:34 PM

## 2021-02-21 ENCOUNTER — Inpatient Hospital Stay
Admit: 2021-02-21 | Discharge: 2021-02-21 | Disposition: A | Discharge status: Home / Self Care | Payer: Self-pay | Attending: Infectious Diseases | Admitting: Infectious Diseases

## 2021-02-21 ENCOUNTER — Inpatient Hospital Stay: Payer: Self-pay

## 2021-02-21 LAB — CBC
HCT: 31.6 % — ABNORMAL LOW (ref 39.0–52.0)
Hemoglobin: 10.6 g/dL — ABNORMAL LOW (ref 13.0–17.0)
MCH: 30.6 pg (ref 26.0–34.0)
MCHC: 33.5 g/dL (ref 30.0–36.0)
MCV: 91.3 fL (ref 80.0–100.0)
Platelets: 150 10*3/uL (ref 150–400)
RBC: 3.46 MIL/uL — ABNORMAL LOW (ref 4.22–5.81)
RDW: 12.6 % (ref 11.5–15.5)
WBC: 7.2 10*3/uL (ref 4.0–10.5)
nRBC: 0 % (ref 0.0–0.2)

## 2021-02-21 LAB — CK: Total CK: 1867 U/L — ABNORMAL HIGH (ref 49–397)

## 2021-02-21 LAB — COMPREHENSIVE METABOLIC PANEL
ALT: 33 U/L (ref 0–44)
AST: 53 U/L — ABNORMAL HIGH (ref 15–41)
Albumin: 3.3 g/dL — ABNORMAL LOW (ref 3.5–5.0)
Alkaline Phosphatase: 58 U/L (ref 38–126)
Anion gap: 8 (ref 5–15)
BUN: 13 mg/dL (ref 8–23)
CO2: 28 mmol/L (ref 22–32)
Calcium: 8.6 mg/dL — ABNORMAL LOW (ref 8.9–10.3)
Chloride: 107 mmol/L (ref 98–111)
Creatinine, Ser: 0.84 mg/dL (ref 0.61–1.24)
GFR, Estimated: >60 mL/min (ref >60)
Glucose, Bld: 121 mg/dL — ABNORMAL HIGH (ref 70–99)
Potassium: 3.6 mmol/L (ref 3.5–5.1)
Sodium: 143 mmol/L (ref 135–145)
Total Bilirubin: 1.1 mg/dL (ref 0.3–1.2)
Total Protein: 5.8 g/dL — ABNORMAL LOW (ref 6.5–8.1)

## 2021-02-21 LAB — SEDIMENTATION RATE: Sed Rate: 35 mm/hr — ABNORMAL HIGH (ref 0–20)

## 2021-02-21 LAB — C-REACTIVE PROTEIN: CRP: 3.1 mg/dL — ABNORMAL HIGH (ref <1.0)

## 2021-02-21 MED ORDER — LISINOPRIL 10 MG PO TABS
5.0000 mg | ORAL_TABLET | Freq: Every day | ORAL | Status: DC
  Administered 2021-02-21: 5 mg via ORAL
  Filled 2021-02-21: qty 1

## 2021-02-21 MED ORDER — ACETAMINOPHEN 325 MG PO TABS: 650.0000 mg | ORAL_TABLET | Freq: Four times a day (QID) | ORAL | Status: DC | PRN

## 2021-02-21 MED ORDER — KETOROLAC TROMETHAMINE 30 MG/ML IJ SOLN
30.0000 mg | Freq: Once | INTRAMUSCULAR | Status: AC
  Administered 2021-02-21: 30 mg via INTRAVENOUS
  Filled 2021-02-21: qty 1

## 2021-02-21 MED ORDER — GADOBUTROL 1 MMOL/ML IV SOLN
10.0000 mL | Freq: Once | INTRAVENOUS | Status: AC | PRN
  Administered 2021-02-21: 10 mL via INTRAVENOUS

## 2021-02-21 MED ORDER — LISINOPRIL 10 MG PO TABS
10.0000 mg | ORAL_TABLET | Freq: Every day | ORAL | Status: DC
  Administered 2021-02-22: 10 mg via ORAL
  Filled 2021-02-21: qty 1

## 2021-02-21 MED ORDER — HYDRALAZINE HCL 25 MG PO TABS
25.0000 mg | ORAL_TABLET | Freq: Four times a day (QID) | ORAL | Status: DC | PRN
  Administered 2021-02-21: 25 mg via ORAL
  Filled 2021-02-21: qty 1

## 2021-02-21 MED ORDER — LISINOPRIL 10 MG PO TABS
5.0000 mg | ORAL_TABLET | Freq: Once | ORAL | Status: AC
  Administered 2021-02-21: 5 mg via ORAL
  Filled 2021-02-21: qty 1

## 2021-02-21 MED ORDER — ACETAMINOPHEN 650 MG RE SUPP: 650.0000 mg | Freq: Four times a day (QID) | RECTAL | Status: DC | PRN

## 2021-02-21 MED ORDER — OXYCODONE HCL 5 MG PO TABS
5.0000 mg | ORAL_TABLET | Freq: Three times a day (TID) | ORAL | Status: DC | PRN
  Administered 2021-02-21 – 2021-02-22 (×4): 5 mg via ORAL
  Filled 2021-02-21 (×4): qty 1

## 2021-02-21 NOTE — Consult Note (Signed)
Pharmacy Antibiotic Note  Anthony Nguyen is a 65 y.o. male w/ h/o HTN, GERD, h/o diverticulitis, & chronic pain (Lumbar DDD) admitted on 02/19/2021 with AMS & c/f aspiration pneumonia.  Pharmacy has been consulted for Unasyn dosing.  Imaging: 5/4 CT chest: RUL infiltrate c/w pneumonia. 5/4 CT head: negative for any acute abnormalities.  Plan: Continue Unasyn 3g q6h per current renal function.   Will continue for now pending ECHO results per ID (MRI negative for discitis/osteomyelitis) - possible de-escalation to augmentin pending results.  Height: 6\' 1"  (185.4 cm) Weight: 108.7 kg (239 lb 10.2 oz) IBW/kg (Calculated) : 79.9  Temp (24hrs), Avg:98.4 F (36.9 C), Min:98 F (36.7 C), Max:99.2 F (37.3 C)  Recent Labs  Lab 02/19/21 1030 02/20/21 0413 02/21/21 0420  WBC 15.4* 6.7 7.2  CREATININE 1.17 0.76 0.84  LATICACIDVEN 1.9  --   --     Estimated Creatinine Clearance: 117.9 mL/min (by C-G formula based on SCr of 0.84 mg/dL).    Allergies  Allergen Reactions  . Shellfish Allergy Swelling    Oysters and fish last time he consumed caused swelling in the bottom legs     Antimicrobials this admission: Zosyn x1 (5/4); Unasyn (5/4 >>   Dose adjustments this admission: N/a CTM  Microbiology results: 5/4 BCx: Staph Epidermis from 2 different sets Susceptibilities pending 5/4: Covid/Flu negative  Thank you for allowing pharmacy to be a part of this patient's care.  Lu Duffel, PharmD, BCPS Clinical Pharmacist 02/21/2021 3:19 PM

## 2021-02-21 NOTE — Consult Note (Signed)
Infectious Disease     Reason for Consult:  Bacteremia     Referring Physician: Dr Maren Beach Date of Admission:  02/19/2021   Principal Problem:   Aspiration pneumonia ([place]) Active Problems:   Chronic pain syndrome   Benign prostatic hyperplasia with urinary frequency   Essential hypertension   Acute metabolic encephalopathy   HLD (hyperlipidemia)   Sepsis ([place])   Elevated CK   HPI: Anthony Nguyen is a 65 y.o. male with a history of hypertension, hyperlipidemia, GERD, depression, restless leg syndrome, diverticulosis, history of melanoma, tobacco use, BPPV, BPH, chronic pain syndrome on oxycodone who was admitted May 4 with altered mental status.  He was found by his girlfriend confused and somnolent.  He was also having slurred speech.  He had fallen asleep on the couch.  He had some blood around his nose and mouth.  In the ER he was confused and lethargic.  Peripheral is also concerned he may have taken more of his oxycodone.  Patient was found to have a white count of 15.4.  His CK was also elevated.  He was hypoxic as well.  He was tachycardic.  Chest x-ray showed right upper lobe infiltration and right perihilar opacity.  CT of the head was negative.  He was admitted with a diagnosis of likely aspiration pneumonia.  Started on Unasyn after 1 dose of Zosyn in the ER. Since admission he has had no fever.  His white count has come down from 15-7.  Blood cultures have turned positive with 2 of 2 growing gram-positive cocci.  One of them has been identified as staph epidermidis with negative MIC 18.  The other 1 is yet to be identified. He contines to have back pain. Had lumbar injection in March.   Past Medical History:  Diagnosis Date  . Allergy   . Arthritis    osteoarthritis  . Cancer ([place])    melanoma skin cancer face  . Chronic back pain    goes to Dr. Holley Raring at the pain clinic  . Current every day smoker   . Diverticulosis   . GERD (gastroesophageal reflux disease)   . H/O  diverticulitis of colon   . Restless leg syndrome   . Sciatic pain    Past Surgical History:  Procedure Laterality Date  . JOINT REPLACEMENT Left 10/14/2018   knee  . skin cancer removal     face  . TONSILLECTOMY AND ADENOIDECTOMY    . TOTAL KNEE ARTHROPLASTY Left 10/14/2017   Procedure: TOTAL KNEE ARTHROPLASTY;  Surgeon: Hessie Knows, MD;  Location: [facility] ORS;  Service: Orthopedics;  Laterality: Left;   Social History   Tobacco Use  . Smoking status: Current Every Day Smoker    Packs/day: 2.00    Years: 30.00    Pack years: 60.00    Types: Cigarettes  . Smokeless tobacco: Current User  Vaping Use  . Vaping Use: Never used  Substance Use Topics  . Alcohol use: No  . Drug use: No   Family History  Problem Relation Age of Onset  . Hypertension Mother   . Alzheimer's disease Father   . Prostate cancer Father 22  . Cancer Sister 21       breast cancer  . Diabetes Sister   . Heart disease Brother        electrical pathways - ongoing workup  . Healthy Brother   . Healthy Son   . Healthy Son   . Healthy Son   . Healthy Son  Allergies:  Allergies  Allergen Reactions  . Shellfish Allergy Swelling    Oysters and fish last time he consumed caused swelling in the bottom legs     Current antibiotics: Antibiotics Given (last 72 hours)    Date/Time Action Medication Dose Rate   02/19/21 1144 New Bag/Given   piperacillin-tazobactam (ZOSYN) IVPB 3.375 g 3.375 g 100 mL/hr   02/19/21 2319 New Bag/Given   Ampicillin-Sulbactam (UNASYN) 3 g in sodium chloride 0.9 % 100 mL IVPB 3 g 200 mL/hr   02/20/21 0211 New Bag/Given   Ampicillin-Sulbactam (UNASYN) 3 g in sodium chloride 0.9 % 100 mL IVPB 3 g 200 mL/hr   02/20/21 0829 New Bag/Given   Ampicillin-Sulbactam (UNASYN) 3 g in sodium chloride 0.9 % 100 mL IVPB 3 g 200 mL/hr   02/20/21 1515 New Bag/Given   Ampicillin-Sulbactam (UNASYN) 3 g in sodium chloride 0.9 % 100 mL IVPB 3 g 200 mL/hr   02/20/21 2039 New Bag/Given    Ampicillin-Sulbactam (UNASYN) 3 g in sodium chloride 0.9 % 100 mL IVPB 3 g 200 mL/hr   02/21/21 0243 New Bag/Given   Ampicillin-Sulbactam (UNASYN) 3 g in sodium chloride 0.9 % 100 mL IVPB 3 g 200 mL/hr   02/21/21 0934 New Bag/Given   Ampicillin-Sulbactam (UNASYN) 3 g in sodium chloride 0.9 % 100 mL IVPB 3 g 200 mL/hr      MEDICATIONS: . Chlorhexidine Gluconate Cloth  6 each Topical Daily  . DULoxetine  30 mg Oral Daily  . enoxaparin (LOVENOX) injection  0.5 mg/kg Subcutaneous Q24H  . gabapentin  600 mg Oral TID  . lisinopril  5 mg Oral Daily  . nicotine  21 mg Transdermal Daily  . tamsulosin  0.4 mg Oral QPC supper    Review of Systems - 11 systems reviewed and negative per HPI   OBJECTIVE: Temp:  [97.9 F (36.6 C)-99.2 F (37.3 C)] 98.1 F (36.7 C) (05/06 0808) Pulse Rate:  [63-85] 63 (05/06 0808) Resp:  [15-20] 20 (05/06 0808) BP: (138-166)/(69-85) 162/72 (05/06 0808) SpO2:  [92 %-100 %] 92 % (05/06 7867) Physical Exam  Constitutional: He is oriented to person, place, and time. He appears well-developed and well-nourished. No distress.  HENT:  Mouth/Throat: Oropharynx is clear and moist. No oropharyngeal exudate.  Cardiovascular: Normal rate, regular rhythm and normal heart sounds. 2/6 SM Pulmonary/Chest: Effort normal and breath sounds normal. No respiratory distress. He has no wheezes.  Abdominal: Soft. Bowel sounds are normal. He exhibits no distension. There is no tenderness.  Lymphadenopathy:  He has no cervical adenopathy.  Neurological: He is alert and oriented to person, place, and time.  Skin: Skin is warm and dry. No rash noted. No erythema.  Psychiatric: He has a normal mood and affect. His behavior is normal. Lumbar mild ttp     LABS: Results for orders placed or performed during the hospital encounter of 02/19/21 (from the past 48 hour(s))  Culture, blood (routine x 2)     Status: None (Preliminary result)   Collection Time: 02/19/21 10:29 AM    Specimen: BLOOD  Result Value Ref Range   Specimen Description BLOOD BLOOD RIGHT HAND    Special Requests      BOTTLES DRAWN AEROBIC AND ANAEROBIC Blood Culture adequate volume   Culture  Setup Time      GRAM POSITIVE COCCI AEROBIC BOTTLE ONLY CRITICAL VALUE NOTED.  VALUE IS CONSISTENT WITH PREVIOUSLY REPORTED AND CALLED VALUE. Performed at [facility], [address], [place], [place] [address]  Culture GRAM POSITIVE COCCI    Report Status PENDING   Procalcitonin     Status: None   Collection Time: 02/19/21 10:29 AM  Result Value Ref Range   Procalcitonin 0.43 ng/mL    Comment:        Interpretation: PCT (Procalcitonin) <= 0.5 ng/mL: Systemic infection (sepsis) is not likely. Local bacterial infection is possible. (NOTE)       Sepsis PCT Algorithm           Lower Respiratory Tract                                      Infection PCT Algorithm    ----------------------------     ----------------------------         PCT < 0.25 ng/mL                PCT < 0.10 ng/mL          Strongly encourage             Strongly discourage   discontinuation of antibiotics    initiation of antibiotics    ----------------------------     -----------------------------       PCT 0.25 - 0.50 ng/mL            PCT 0.10 - 0.25 ng/mL               OR       >80% decrease in PCT            Discourage initiation of                                            antibiotics      Encourage discontinuation           of antibiotics    ----------------------------     -----------------------------         PCT >= 0.50 ng/mL              PCT 0.26 - 0.50 ng/mL               AND        <80% decrease in PCT             Encourage initiation of                                             antibiotics       Encourage continuation           of antibiotics    ----------------------------     -----------------------------        PCT >= 0.50 ng/mL                  PCT > 0.50 ng/mL               AND          increase in PCT                  Strongly encourage  initiation of antibiotics    Strongly encourage escalation           of antibiotics                                     -----------------------------                                           PCT <= 0.25 ng/mL                                                 OR                                        > 80% decrease in PCT                                      Discontinue / Do not initiate                                             antibiotics  Performed at [facility], [address] [place], [place] [address]   Culture, blood (routine x 2)     Status: Abnormal (Preliminary result)   Collection Time: 02/19/21 10:30 AM   Specimen: BLOOD  Result Value Ref Range   Specimen Description      BLOOD RIGHT ANTECUBITAL Performed at [facility], [address], [place], [place] [address]    Special Requests      BOTTLES DRAWN AEROBIC AND ANAEROBIC Blood Culture adequate volume Performed at [facility], [address] [place], [place] [address]    Culture  Setup Time      Organism ID to follow [place] TO, READ BACK BY AND VERIFIED WITH: ALEX CHAPPELL AT [place] ON 02/20/2021 [place]. Performed at [facility], [address] [place], [place] [address]    Culture (A)     STAPHYLOCOCCUS EPIDERMIDIS SUSCEPTIBILITIES TO FOLLOW Performed at [facility], [place] [address] [place], [place] [address]    Report Status PENDING   Lactic acid, plasma     Status: None   Collection Time: 02/19/21 10:30 AM  Result Value Ref Range   Lactic Acid, Venous 1.9 0.5 - 1.9 mmol/L    Comment: Performed at [facility], [address] [place], [place] [address]  CBC with Differential     Status: Abnormal   Collection Time: 02/19/21 10:30 AM  Result Value Ref Range   WBC 15.4 (H) 4.0 - 10.5 K/uL   RBC 3.97 (L)  4.22 - 5.81 MIL/uL   Hemoglobin 12.2 (L) 13.0 - 17.0 g/dL   HCT 37.0 (L) 39.0 - 52.0 %   MCV 93.2 80.0 - 100.0 fL   MCH 30.7 26.0 - 34.0 pg   MCHC 33.0 30.0 - 36.0 g/dL  RDW 12.9 11.5 - 15.5 %   Platelets 189 150 - 400 K/uL   nRBC 0.0 0.0 - 0.2 %   Neutrophils Relative % 86 %   Neutro Abs 13.4 (H) 1.7 - 7.7 K/uL   Lymphocytes Relative 5 %   Lymphs Abs 0.8 0.7 - 4.0 K/uL   Monocytes Relative 8 %   Monocytes Absolute 1.2 (H) 0.1 - 1.0 K/uL   Eosinophils Relative 0 %   Eosinophils Absolute 0.0 0.0 - 0.5 K/uL   Basophils Relative 0 %   Basophils Absolute 0.0 0.0 - 0.1 K/uL   Immature Granulocytes 1 %   Abs Immature Granulocytes 0.10 (H) 0.00 - 0.07 K/uL    Comment: Performed at [facility], [address] [place], [place] [address]  Comprehensive metabolic panel     Status: Abnormal   Collection Time: 02/19/21 10:30 AM  Result Value Ref Range   Sodium 138 135 - 145 mmol/L   Potassium 4.6 3.5 - 5.1 mmol/L   Chloride 103 98 - 111 mmol/L   CO2 24 22 - 32 mmol/L   Glucose, Bld 142 (H) 70 - 99 mg/dL    Comment: Glucose reference range applies only to samples taken after fasting for at least 8 hours.   BUN 20 8 - 23 mg/dL   Creatinine, Ser 1.17 0.61 - 1.24 mg/dL   Calcium 8.7 (L) 8.9 - 10.3 mg/dL   Total Protein 7.1 6.5 - 8.1 g/dL   Albumin 4.2 3.5 - 5.0 g/dL   AST 32 15 - 41 U/L   ALT 26 0 - 44 U/L   Alkaline Phosphatase 70 38 - 126 U/L   Total Bilirubin 0.8 0.3 - 1.2 mg/dL   GFR, Estimated >60 >60 mL/min    Comment: (NOTE) Calculated using the CKD-EPI Creatinine Equation (2021)    Anion gap 11 5 - 15    Comment: Performed at [facility], [address] [place], [place] [address]  Ethanol     Status: None   Collection Time: 02/19/21 10:30 AM  Result Value Ref Range   Alcohol, Ethyl (B) <10 <10 mg/dL    Comment: (NOTE) Lowest detectable limit for serum alcohol is 10 mg/dL.  For medical purposes only. Performed at [facility], [address] [place], [place] [address]   Salicylate level     Status: Abnormal   Collection Time: 02/19/21 10:30 AM  Result Value Ref Range   Salicylate Lvl <0.3 (L) 7.0 - 30.0 mg/dL    Comment: Performed at [facility], [address] [place], [place] [address]  Acetaminophen level     Status: Abnormal   Collection Time: 02/19/21 10:30 AM  Result Value Ref Range   Acetaminophen (Tylenol), Serum <10 (L) 10 - 30 ug/mL    Comment: (NOTE) Therapeutic concentrations vary significantly. A range of 10-30 ug/mL  may be an effective concentration for many patients. However, some  are best treated at concentrations outside of this range. Acetaminophen concentrations >150 ug/mL at 4 hours after ingestion  and >50 ug/mL at 12 hours after ingestion are often associated with  toxic reactions.  Performed at [facility], [address] [place], [place] [address]   CK     Status: Abnormal   Collection Time: 02/19/21 10:30 AM  Result Value Ref Range   Total CK 884 (H) 49 - 397 U/L    Comment: Performed at [facility], [address], [place], [place] [address]  Blood Culture  ID Panel (Reflexed)     Status: Abnormal   Collection Time: 02/19/21 10:30 AM  Result Value Ref Range   Enterococcus faecalis NOT DETECTED NOT DETECTED   Enterococcus Faecium NOT DETECTED NOT DETECTED   Listeria monocytogenes NOT DETECTED NOT DETECTED   Staphylococcus species DETECTED (A) NOT DETECTED    Comment: CRITICAL RESULT CALLED TO, READ BACK BY AND VERIFIED WITH: ALEX CHAPPELL AT 0739 ON 02/20/2021 [place].    Staphylococcus aureus (BCID) NOT DETECTED NOT DETECTED   Staphylococcus epidermidis DETECTED (A) NOT DETECTED    Comment: CRITICAL RESULT CALLED TO, READ BACK BY AND VERIFIED WITH: ALEX CHAPPELL AT 6378 ON 02/20/2021 [place].    Staphylococcus lugdunensis NOT DETECTED NOT DETECTED   Streptococcus species NOT DETECTED NOT DETECTED   Streptococcus agalactiae NOT DETECTED NOT DETECTED    Streptococcus pneumoniae NOT DETECTED NOT DETECTED   Streptococcus pyogenes NOT DETECTED NOT DETECTED   A.calcoaceticus-baumannii NOT DETECTED NOT DETECTED   Bacteroides fragilis NOT DETECTED NOT DETECTED   Enterobacterales NOT DETECTED NOT DETECTED   Enterobacter cloacae complex NOT DETECTED NOT DETECTED   Escherichia coli NOT DETECTED NOT DETECTED   Klebsiella aerogenes NOT DETECTED NOT DETECTED   Klebsiella oxytoca NOT DETECTED NOT DETECTED   Klebsiella pneumoniae NOT DETECTED NOT DETECTED   Proteus species NOT DETECTED NOT DETECTED   Salmonella species NOT DETECTED NOT DETECTED   Serratia marcescens NOT DETECTED NOT DETECTED   Haemophilus influenzae NOT DETECTED NOT DETECTED   Neisseria meningitidis NOT DETECTED NOT DETECTED   Pseudomonas aeruginosa NOT DETECTED NOT DETECTED   Stenotrophomonas maltophilia NOT DETECTED NOT DETECTED   Candida albicans NOT DETECTED NOT DETECTED   Candida auris NOT DETECTED NOT DETECTED   Candida glabrata NOT DETECTED NOT DETECTED   Candida krusei NOT DETECTED NOT DETECTED   Candida parapsilosis NOT DETECTED NOT DETECTED   Candida tropicalis NOT DETECTED NOT DETECTED   Cryptococcus neoformans/gattii NOT DETECTED NOT DETECTED   Methicillin resistance mecA/C NOT DETECTED NOT DETECTED    Comment: Performed at [facility], [address] [place], [place] [address]  Brain natriuretic peptide     Status: Abnormal   Collection Time: 02/19/21 10:32 AM  Result Value Ref Range   B Natriuretic Peptide 126.4 (H) 0.0 - 100.0 pg/mL    Comment: Performed at [facility], [address] [place], [place] [address]  Resp Panel by RT-PCR (Flu A&B, Covid) Nasopharyngeal Swab     Status: None   Collection Time: 02/19/21 10:38 AM   Specimen: Nasopharyngeal Swab; Nasopharyngeal(NP) swabs in vial transport medium  Result Value Ref Range   SARS Coronavirus 2 by RT PCR NEGATIVE NEGATIVE    Comment: (NOTE) SARS-CoV-2 target nucleic acids are NOT  DETECTED.  The SARS-CoV-2 RNA is generally detectable in upper respiratory specimens during the acute phase of infection. The lowest concentration of SARS-CoV-2 viral copies this assay can detect is 138 copies/mL. A negative result does not preclude SARS-Cov-2 infection and should not be used as the sole basis for treatment or other patient management decisions. A negative result may occur with  improper specimen collection/handling, submission of specimen other than nasopharyngeal swab, presence of viral mutation(s) within the areas targeted by this assay, and inadequate number of viral copies(<138 copies/mL). A negative result must be combined with clinical observations, patient history, and epidemiological information. The expected result is Negative.  Fact Sheet for Patients:  EntrepreneurPulse.com.au  Fact Sheet for Healthcare Providers:  IncredibleEmployment.be  This test is no t yet approved or cleared  by the [place] and  has been authorized for detection and/or diagnosis of SARS-CoV-2 by FDA under an Emergency Use Authorization (EUA). This EUA will remain  in effect (meaning this test can be used) for the duration of the COVID-19 declaration under Section 564(b)(1) of the Act, 21 U.S.C.section 360bbb-3(b)(1), unless the authorization is terminated  or revoked sooner.       Influenza A by PCR NEGATIVE NEGATIVE   Influenza B by PCR NEGATIVE NEGATIVE    Comment: (NOTE) The Xpert Xpress SARS-CoV-2/FLU/RSV plus assay is intended as an aid in the diagnosis of influenza from Nasopharyngeal swab specimens and should not be used as a sole basis for treatment. Nasal washings and aspirates are unacceptable for Xpert Xpress SARS-CoV-2/FLU/RSV testing.  Fact Sheet for Patients: EntrepreneurPulse.com.au  Fact Sheet for Healthcare Providers: IncredibleEmployment.be  This test is not yet approved or  cleared by the [place] FDA and has been authorized for detection and/or diagnosis of SARS-CoV-2 by FDA under an Emergency Use Authorization (EUA). This EUA will remain in effect (meaning this test can be used) for the duration of the COVID-19 declaration under Section 564(b)(1) of the Act, 21 U.S.C. section 360bbb-3(b)(1), unless the authorization is terminated or revoked.  Performed at [facility], [address] [place], [place] [address]   Blood gas, venous     Status: Abnormal   Collection Time: 02/19/21 10:38 AM  Result Value Ref Range   pH, Ven 7.29 7.250 - 7.430   pCO2, Ven 60 44.0 - 60.0 mmHg   pO2, Ven 59.0 (H) 32.0 - 45.0 mmHg   Bicarbonate 28.9 (H) 20.0 - 28.0 mmol/L   Acid-Base Excess 1.0 0.0 - 2.0 mmol/L   O2 Saturation 86.8 %   Patient temperature 37.0    Collection site VEIN    Sample type VEIN     Comment: Performed at [facility], [address] [place], [place] [address]  Urinalysis, Complete w Microscopic     Status: Abnormal   Collection Time: 02/19/21  5:50 PM  Result Value Ref Range   Color, Urine YELLOW (A) YELLOW   APPearance HAZY (A) CLEAR   Specific Gravity, Urine 1.017 1.005 - 1.030   pH 5.0 5.0 - 8.0   Glucose, UA NEGATIVE NEGATIVE mg/dL   Hgb urine dipstick NEGATIVE NEGATIVE   Bilirubin Urine NEGATIVE NEGATIVE   Ketones, ur NEGATIVE NEGATIVE mg/dL   Protein, ur 30 (A) NEGATIVE mg/dL   Nitrite NEGATIVE NEGATIVE   Leukocytes,Ua NEGATIVE NEGATIVE   RBC / HPF 6-10 0 - 5 RBC/hpf   WBC, UA 0-5 0 - 5 WBC/hpf   Bacteria, UA NONE SEEN NONE SEEN   Squamous Epithelial / LPF 0-5 0 - 5   Mucus PRESENT    Hyaline Casts, UA PRESENT     Comment: Performed at [facility], [address] [place], [place] [address]  Urine Drug Screen, Qualitative     Status: Abnormal   Collection Time: 02/19/21  5:50 PM  Result Value Ref Range   Tricyclic, Ur Screen NONE DETECTED NONE DETECTED   Amphetamines, Ur Screen NONE DETECTED  NONE DETECTED   MDMA (Ecstasy)Ur Screen NONE DETECTED NONE DETECTED   Cocaine Metabolite,Ur [place] NONE DETECTED NONE DETECTED   Opiate, Ur Screen POSITIVE (A) NONE DETECTED   Phencyclidine (PCP) Ur S NONE DETECTED NONE DETECTED   Cannabinoid 50 Ng, Ur [place] NONE DETECTED NONE DETECTED   Barbiturates, Ur Screen NONE DETECTED NONE DETECTED   Benzodiazepine, Ur Scrn  POSITIVE (A) NONE DETECTED   Methadone Scn, Ur NONE DETECTED NONE DETECTED    Comment: (NOTE) Tricyclics + metabolites, urine    Cutoff 1000 ng/mL Amphetamines + metabolites, urine  Cutoff 1000 ng/mL MDMA (Ecstasy), urine              Cutoff 500 ng/mL Cocaine Metabolite, urine          Cutoff 300 ng/mL Opiate + metabolites, urine        Cutoff 300 ng/mL Phencyclidine (PCP), urine         Cutoff 25 ng/mL Cannabinoid, urine                 Cutoff 50 ng/mL Barbiturates + metabolites, urine  Cutoff 200 ng/mL Benzodiazepine, urine              Cutoff 200 ng/mL Methadone, urine                   Cutoff 300 ng/mL  The urine drug screen provides only a preliminary, unconfirmed analytical test result and should not be used for non-medical purposes. Clinical consideration and professional judgment should be applied to any positive drug screen result due to possible interfering substances. A more specific alternate chemical method must be used in order to obtain a confirmed analytical result. Gas chromatography / mass spectrometry (GC/MS) is the preferred confirm atory method. Performed at [facility], [place], [place] [address]   HIV Antibody (routine testing w rflx)     Status: None   Collection Time: 02/20/21  4:13 AM  Result Value Ref Range   HIV Screen 4th Generation wRfx Non Reactive Non Reactive    Comment: Performed at [facility], [place] [address], [place], [place] [address]  Basic metabolic panel     Status: Abnormal   Collection Time: 02/20/21  4:13 AM  Result Value Ref Range   Sodium 141 135  - 145 mmol/L   Potassium 3.7 3.5 - 5.1 mmol/L   Chloride 106 98 - 111 mmol/L   CO2 29 22 - 32 mmol/L   Glucose, Bld 91 70 - 99 mg/dL    Comment: Glucose reference range applies only to samples taken after fasting for at least 8 hours.   BUN 15 8 - 23 mg/dL   Creatinine, Ser 0.76 0.61 - 1.24 mg/dL   Calcium 8.4 (L) 8.9 - 10.3 mg/dL   GFR, Estimated >60 >60 mL/min    Comment: (NOTE) Calculated using the CKD-EPI Creatinine Equation (2021)    Anion gap 6 5 - 15    Comment: Performed at [facility], [address] [place], [place] [address]  CBC     Status: Abnormal   Collection Time: 02/20/21  4:13 AM  Result Value Ref Range   WBC 6.7 4.0 - 10.5 K/uL   RBC 3.16 (L) 4.22 - 5.81 MIL/uL   Hemoglobin 9.5 (L) 13.0 - 17.0 g/dL   HCT 30.0 (L) 39.0 - 52.0 %   MCV 94.9 80.0 - 100.0 fL   MCH 30.1 26.0 - 34.0 pg   MCHC 31.7 30.0 - 36.0 g/dL   RDW 12.9 11.5 - 15.5 %   Platelets 134 (L) 150 - 400 K/uL   nRBC 0.0 0.0 - 0.2 %    Comment: Performed at [facility], [address], [place], [place] [address]  CK     Status: Abnormal   Collection Time: 02/20/21  4:13 AM  Result Value Ref Range  Total CK 3,193 (H) 49 - 397 U/L    Comment: Performed at [facility], [address] [place], [place] [address]  CK     Status: Abnormal   Collection Time: 02/21/21  4:20 AM  Result Value Ref Range   Total CK 1,867 (H) 49 - 397 U/L    Comment: Performed at [facility], [address] [place], [place] [address]  Comprehensive metabolic panel     Status: Abnormal   Collection Time: 02/21/21  4:20 AM  Result Value Ref Range   Sodium 143 135 - 145 mmol/L   Potassium 3.6 3.5 - 5.1 mmol/L   Chloride 107 98 - 111 mmol/L   CO2 28 22 - 32 mmol/L   Glucose, Bld 121 (H) 70 - 99 mg/dL    Comment: Glucose reference range applies only to samples taken after fasting for at least 8 hours.   BUN 13 8 - 23 mg/dL   Creatinine, Ser 0.84 0.61 - 1.24 mg/dL   Calcium 8.6 (L)  8.9 - 10.3 mg/dL   Total Protein 5.8 (L) 6.5 - 8.1 g/dL   Albumin 3.3 (L) 3.5 - 5.0 g/dL   AST 53 (H) 15 - 41 U/L   ALT 33 0 - 44 U/L   Alkaline Phosphatase 58 38 - 126 U/L   Total Bilirubin 1.1 0.3 - 1.2 mg/dL   GFR, Estimated >60 >60 mL/min    Comment: (NOTE) Calculated using the CKD-EPI Creatinine Equation (2021)    Anion gap 8 5 - 15    Comment: Performed at [facility], [address] [place], [place] [address]  CBC     Status: Abnormal   Collection Time: 02/21/21  4:20 AM  Result Value Ref Range   WBC 7.2 4.0 - 10.5 K/uL   RBC 3.46 (L) 4.22 - 5.81 MIL/uL   Hemoglobin 10.6 (L) 13.0 - 17.0 g/dL   HCT 31.6 (L) 39.0 - 52.0 %   MCV 91.3 80.0 - 100.0 fL   MCH 30.6 26.0 - 34.0 pg   MCHC 33.5 30.0 - 36.0 g/dL   RDW 12.6 11.5 - 15.5 %   Platelets 150 150 - 400 K/uL   nRBC 0.0 0.0 - 0.2 %    Comment: Performed at [facility], [address] [place], [place] [address]   No components found for: ESR, C REACTIVE PROTEIN MICRO: Recent Results (from the past 720 hour(s))  Culture, blood (routine x 2)     Status: None (Preliminary result)   Collection Time: 02/19/21 10:29 AM   Specimen: BLOOD  Result Value Ref Range Status   Specimen Description BLOOD BLOOD RIGHT HAND  Final   Special Requests   Final    BOTTLES DRAWN AEROBIC AND ANAEROBIC Blood Culture adequate volume   Culture  Setup Time   Final    GRAM POSITIVE COCCI AEROBIC BOTTLE ONLY CRITICAL VALUE NOTED.  VALUE IS CONSISTENT WITH PREVIOUSLY REPORTED AND CALLED VALUE. Performed at [facility], [address] [place], [place] [address]    Culture [facility] POSITIVE COCCI  Final   Report Status PENDING  Incomplete  Culture, blood (routine x 2)     Status: Abnormal (Preliminary result)   Collection Time: 02/19/21 10:30 AM   Specimen: BLOOD  Result Value Ref Range Status   Specimen Description   Final    BLOOD RIGHT ANTECUBITAL Performed at [facility], [address],  [place], [place] [address]    Special Requests  Final    BOTTLES DRAWN AEROBIC AND ANAEROBIC Blood Culture adequate volume Performed at [facility], [address] [place], [place] [address]    Culture  Setup Time   Final    Organism ID to follow GRAM POSITIVE COCCI AEROBIC BOTTLE ONLY CRITICAL RESULT CALLED TO, READ BACK BY AND VERIFIED WITH: ALEX CHAPPELL AT [place] ON 02/20/2021 [place]. Performed at [facility], [address] [place], [place] [address]    Culture (A)  Final    STAPHYLOCOCCUS EPIDERMIDIS SUSCEPTIBILITIES TO FOLLOW Performed at [facility], [place] [address] [place], [place] [address]    Report Status PENDING  Incomplete  Blood Culture ID Panel (Reflexed)     Status: Abnormal   Collection Time: 02/19/21 10:30 AM  Result Value Ref Range Status   Enterococcus faecalis NOT DETECTED NOT DETECTED Final   Enterococcus Faecium NOT DETECTED NOT DETECTED Final   Listeria monocytogenes NOT DETECTED NOT DETECTED Final   Staphylococcus species DETECTED (A) NOT DETECTED Final    Comment: CRITICAL RESULT CALLED TO, READ BACK BY AND VERIFIED WITH: ALEX CHAPPELL AT 2706 ON 02/20/2021 [place].    Staphylococcus aureus (BCID) NOT DETECTED NOT DETECTED Final   Staphylococcus epidermidis DETECTED (A) NOT DETECTED Final    Comment: CRITICAL RESULT CALLED TO, READ BACK BY AND VERIFIED WITH: ALEX CHAPPELL AT 2376 ON 02/20/2021 [place].    Staphylococcus lugdunensis NOT DETECTED NOT DETECTED Final   Streptococcus species NOT DETECTED NOT DETECTED Final   Streptococcus agalactiae NOT DETECTED NOT DETECTED Final   Streptococcus pneumoniae NOT DETECTED NOT DETECTED Final   Streptococcus pyogenes NOT DETECTED NOT DETECTED Final   A.calcoaceticus-baumannii NOT DETECTED NOT DETECTED Final   Bacteroides fragilis NOT DETECTED NOT DETECTED Final   Enterobacterales NOT DETECTED NOT DETECTED Final   Enterobacter cloacae complex NOT DETECTED NOT DETECTED Final   Escherichia coli NOT  DETECTED NOT DETECTED Final   Klebsiella aerogenes NOT DETECTED NOT DETECTED Final   Klebsiella oxytoca NOT DETECTED NOT DETECTED Final   Klebsiella pneumoniae NOT DETECTED NOT DETECTED Final   Proteus species NOT DETECTED NOT DETECTED Final   Salmonella species NOT DETECTED NOT DETECTED Final   Serratia marcescens NOT DETECTED NOT DETECTED Final   Haemophilus influenzae NOT DETECTED NOT DETECTED Final   Neisseria meningitidis NOT DETECTED NOT DETECTED Final   Pseudomonas aeruginosa NOT DETECTED NOT DETECTED Final   Stenotrophomonas maltophilia NOT DETECTED NOT DETECTED Final   Candida albicans NOT DETECTED NOT DETECTED Final   Candida auris NOT DETECTED NOT DETECTED Final   Candida glabrata NOT DETECTED NOT DETECTED Final   Candida krusei NOT DETECTED NOT DETECTED Final   Candida parapsilosis NOT DETECTED NOT DETECTED Final   Candida tropicalis NOT DETECTED NOT DETECTED Final   Cryptococcus neoformans/gattii NOT DETECTED NOT DETECTED Final   Methicillin resistance mecA/C NOT DETECTED NOT DETECTED Final    Comment: Performed at [facility], [address] [place], [place] [address]  Resp Panel by RT-PCR (Flu A&B, Covid) Nasopharyngeal Swab     Status: None   Collection Time: 02/19/21 10:38 AM   Specimen: Nasopharyngeal Swab; Nasopharyngeal(NP) swabs in vial transport medium  Result Value Ref Range Status   SARS Coronavirus 2 by RT PCR NEGATIVE NEGATIVE Final    Comment: (NOTE) SARS-CoV-2 target nucleic acids are NOT DETECTED.  The SARS-CoV-2 RNA is generally detectable in upper respiratory specimens during the acute phase of infection. The lowest concentration of SARS-CoV-2 viral copies this assay can detect is 138 copies/mL.  A negative result does not preclude SARS-Cov-2 infection and should not be used as the sole basis for treatment or other patient management decisions. A negative result may occur with  improper specimen collection/handling, submission of specimen  other than nasopharyngeal swab, presence of viral mutation(s) within the areas targeted by this assay, and inadequate number of viral copies(<138 copies/mL). A negative result must be combined with clinical observations, patient history, and epidemiological information. The expected result is Negative.  Fact Sheet for Patients:  EntrepreneurPulse.com.au  Fact Sheet for Healthcare Providers:  IncredibleEmployment.be  This test is no t yet approved or cleared by the [place] FDA and  has been authorized for detection and/or diagnosis of SARS-CoV-2 by FDA under an Emergency Use Authorization (EUA). This EUA will remain  in effect (meaning this test can be used) for the duration of the COVID-19 declaration under Section 564(b)(1) of the Act, 21 U.S.C.section 360bbb-3(b)(1), unless the authorization is terminated  or revoked sooner.       Influenza A by PCR NEGATIVE NEGATIVE Final   Influenza B by PCR NEGATIVE NEGATIVE Final    Comment: (NOTE) The Xpert Xpress SARS-CoV-2/FLU/RSV plus assay is intended as an aid in the diagnosis of influenza from Nasopharyngeal swab specimens and should not be used as a sole basis for treatment. Nasal washings and aspirates are unacceptable for Xpert Xpress SARS-CoV-2/FLU/RSV testing.  Fact Sheet for Patients: EntrepreneurPulse.com.au  Fact Sheet for Healthcare Providers: IncredibleEmployment.be  This test is not yet approved or cleared by the [place] FDA and has been authorized for detection and/or diagnosis of SARS-CoV-2 by FDA under an Emergency Use Authorization (EUA). This EUA will remain in effect (meaning this test can be used) for the duration of the COVID-19 declaration under Section 564(b)(1) of the Act, 21 U.S.C. section 360bbb-3(b)(1), unless the authorization is terminated or revoked.  Performed at [facility], [address]  [place], [place] [address]     IMAGING: CT Head Wo Contrast  Result Date: 02/19/2021 CLINICAL DATA:  Mental status change since last night. Difficulty communicating. EXAM: CT HEAD WITHOUT CONTRAST TECHNIQUE: Contiguous axial images were obtained from the base of the skull through the vertex without intravenous contrast. COMPARISON:  CT head 04/18/2019. FINDINGS: Brain: There is no evidence of acute intracranial hemorrhage, mass lesion, brain edema or extra-axial fluid collection. The ventricles and subarachnoid spaces are appropriately sized for age. There is no CT evidence of acute cortical infarction. Vascular: Mild intracranial atherosclerosis. No hyperdense vessel identified. Skull: Negative for fracture or focal lesion. Sinuses/Orbits: Mild mucosal thickening in the paranasal sinuses, similar to previous study. No air-fluid levels identified. The mastoid air cells and middle ears are clear. No orbital abnormalities are seen. Other: None. IMPRESSION: 1. Stable CT of the head.  No acute intracranial findings. 2. Mild paranasal sinus disease, similar to previous CT. Electronically Signed   By: Richardean Sale M.D.   On: 02/19/2021 11:30   DG Chest Portable 1 View  Result Date: 02/19/2021 CLINICAL DATA:  Weakness, altered mental status; history of melanoma, GERD, smoker EXAM: PORTABLE CHEST 1 VIEW COMPARISON:  Portable exam 1034 hours compared to 05/12/2019 FINDINGS: Normal heart size, mediastinal contours, and pulmonary vascularity. RIGHT perihilar opacity extending in the RIGHT upper lobe, favor consolidation but cannot exclude underlying mass/adenopathy. Remaining lungs clear. No pleural effusion or pneumothorax. Osseous structures unremarkable. IMPRESSION: RIGHT upper lobe infiltrate consistent with pneumonia. Significant RIGHT perihilar opacity may reflect consolidation but underlying mass and adenopathy are not excluded; either radiographic follow-up  until resolution or CT imaging recommended to exclude  tumor and adenopathy. Electronically Signed   By: Lavonia Dana M.D.   On: 02/19/2021 11:15    Assessment:   Anthony Nguyen is a 65 y.o. male with a history of hypertension, hyperlipidemia, GERD, depression, restless leg syndrome, diverticulosis, history of melanoma, tobacco use, BPPV, BPH, chronic pain syndrome on oxycodone who was admitted May 4 with altered mental status.  He was found by his girlfriend confused and somnolent.  He was also having slurred speech.  He had fallen asleep on the couch.  He had some blood around his nose and mouth.  In the ER he was confused and lethargic.  Peripheral is also concerned he may have taken more of his oxycodone.  Patient was found to have a white count of 15.4.  His CK was also elevated.  He was hypoxic as well.  He was tachycardic.  Chest x-ray showed right upper lobe infiltration and right perihilar opacity.  CT of the head was negative.  He was admitted with a diagnosis of likely aspiration pneumonia.  Started on Unasyn after 1 dose of Zosyn in the ER. Since admission he has had no fever.  His white count has come down from 15-7.  Blood cultures have turned positive with 2 of 2 growing gram-positive cocci.  One of them has been identified as staph epidermidis with negative MIC 18.  The other 1 is yet to be identified. He does have a murmur on exam and has continued LBP after injection in March I suspect all aspiration PNA but with positive cultures and murmur and back pain would eval for endocarditis and discitis/osteomyelitis. ESR however only 35 so less likely Recommendations Check MRI L spine Check Echo REpeat BCX If MRI echo neg can dc on augmentin for total of 10 days of therapy from time of admisson.   Thank you very much for allowing me to participate in the care of this patient. Please call with questions.   Cheral Marker. Ola Spurr, MD

## 2021-02-21 NOTE — Progress Notes (Signed)
PROGRESS NOTE    Anthony Nguyen  [id number] DOB: 14-Sep-1956 DOA: 02/19/2021 PCP: Nelle Don, MD   Chief Complaint  Patient presents with  . Altered Mental Status  Brief Narrative: 65 year old male with history of hypertension, HLD, GERD, depression, RLS, diverticulitis history, melanoma, smoker, BPPV, BPH, chronic pain syndrome on oxycodone presented with altered mental status. As per the report  " patient was found to be confused, somnolent, sleepier since last night (02/18/21). He has slurred speech. He had fallen asleep on the couch around 4 AM and then woke in morning to find him face down on the ground. He has some blood around his nose and month.  In ED was confused, lethargic, somnolent, but arousable- Girlfriend is concerned that he may be taking more pain medication. He possibly took 7-8 pills of Oxycodone due to worsening back pain.  He has dry cough, his oxygen saturation 90% on room air, which improved to 97% of 3 L oxygen. No fever. No chest pain. Per his girlfriend, pt is not using oxygen normally at home. Pt has possible OSA per his girl friend.  ED Course: pt was found to have WBC 15.4, pending BMP, negative COVID PCR, alcohol level less than 10, CK8 84, salicylate level less than 7, lactic acid level 1.9, GFR>60, temperature 99.6, softer blood pressure, tachycardia with heart rate of 115, RR 18, chest x-ray showed right upper lobe infiltration and right perihilar opacity.  CT of head is negative for acute intracranial abnormalities.  Patient is admitted to [facility] bed as inpatient  Subjective: Seen and examined this morning. Off oxygen afebrile overnight  No acute events Is resting comfortably. Assessment & Plan:  Aspiration pneumonia in the setting of altered mental status: MSSE Sepsis:Aerobic bottle of both sets w/ BCID= MSSE. Acute hypoxic respiratory failure due to #1-SPO2 88% on room air in the ED Patient able to come off oxygen.  Leukocytosis resolved.   He is afebrile.  Bacteremia could be from aspiration pneumonia but not confused, I will consult infectious disease.  Continue on Unasyn follow-up culture report.  Continue respiratory support. ambualte Recent Labs  Lab 02/19/21 1029 02/19/21 1030 02/20/21 0413 02/21/21 0420  WBC  --  15.4* 6.7 7.2  LATICACIDVEN  --  1.9  --   --   PROCALCITON 0.43  --   --   --    Acute metabolic encephalopathy, suspected pain medication related.  Received Narcan in the ED CT head no acute finding, nonfocal on exam.  UDS positive for opiate and benzo.  Back to baseline.  Rhabdomyolysis: Likely from laying on the floor.  CK downtrending continue oral statin continue on aggressive IV fluid hydration  Right perihilar opacity chest x-ray no acute consolidation but underlying mass cannot exclude same repeat x-ray/CT chest after pneumonia treatment  Chronic pain syndrome: On chronic oxycodone holding narcotics continue Neurontin, resume hsi pian meds slowly.  BPH W/ urinary frequency: Continue his Flomax, bladder scan  537 ml in ED-Foley has been discontinued 5/5.  Monitor postvoid residue.  Essential hypertension:BP is uncontrolled resume home lisinopril.    Depression continue Cymbalta  HLD: Holding statin due to rhabdomyolysis  Morbid obesity BMI 31.  Will benefit with weight loss in the lifestyle.  Diet Order            Diet regular Room service appropriate? Yes; Fluid consistency: Thin  Diet effective now                He does take pain  medication regularly Body mass index is 31.62 kg/m. DVT prophylaxis: Lovenox Code Status:   Code Status: Full Code  Family Communication: plan of care discussed with patient at bedside.  Status is: Inpatient  Remains inpatient appropriate because:IV treatments appropriate due to intensity of illness or inability to take PO and Inpatient level of care appropriate due to severity of illness  Dispo: The patient is from: Home              Anticipated d/c is  to: Home in 1-2 days.              Patient currently is not medically stable to d/c.   Difficult to place patient No  Unresulted Labs (From admission, onward)          Start     Ordered   02/22/21 [id number]  Basic metabolic panel  Daily,   R     Question:  Specimen collection method  Answer:  Lab=Lab collect   02/20/21 0750   02/21/21 0500  CK  Daily,   R      02/20/21 0749   02/21/21 0500  CBC  Daily,   R     Question:  Specimen collection method  Answer:  Lab=Lab collect   02/20/21 0750   02/19/21 1415  Culture, sputum-assessment  Once,   STAT        02/19/21 1414         Medications reviewed:  Scheduled Meds: . Chlorhexidine Gluconate Cloth  6 each Topical Daily  . DULoxetine  30 mg Oral Daily  . enoxaparin (LOVENOX) injection  0.5 mg/kg Subcutaneous Q24H  . gabapentin  600 mg Oral TID  . nicotine  21 mg Transdermal Daily  . tamsulosin  0.4 mg Oral QPC supper   Continuous Infusions: . ampicillin-sulbactam (UNASYN) IV 200 mL/hr at 02/21/21 0301  . lactated ringers Stopped (02/21/21 0242)    Consultants:see note  Procedures:see note  Antimicrobials: Anti-infectives (From admission, onward)   Start     Dose/Rate Route Frequency Ordered Stop   02/19/21 2000  Ampicillin-Sulbactam (UNASYN) 3 g in sodium chloride 0.9 % 100 mL IVPB        3 g 200 mL/hr over 30 Minutes Intravenous Every 6 hours 02/19/21 1211     02/19/21 1145  piperacillin-tazobactam (ZOSYN) IVPB 3.375 g        3.375 g 100 mL/hr over 30 Minutes Intravenous  Once 02/19/21 1133 02/19/21 1247     Culture/Microbiology    Component Value Date/Time   SDES BLOOD RIGHT ANTECUBITAL 02/19/2021 1030   SPECREQUEST  02/19/2021 1030    BOTTLES DRAWN AEROBIC AND ANAEROBIC Blood Culture adequate volume   CULT GRAM POSITIVE COCCI 02/19/2021 1030   REPTSTATUS PENDING 02/19/2021 1030    Other culture-see note  Objective: Vitals: Today's Vitals   02/20/21 2318 02/21/21 0500 02/21/21 0550 02/21/21 0643  BP: 138/69 (!)  161/77    Pulse: 79 68    Resp: 15 18    Temp: 98.7 F (37.1 C) 98.1 F (36.7 C)    TempSrc:  Oral    SpO2: 95% 96%    Weight:      Height:      PainSc:   8  Asleep    Intake/Output Summary (Last 24 hours) at 02/21/2021 0742 Last data filed at 02/21/2021 0500 Gross per 24 hour  Intake 3734.77 ml  Output 1925 ml  Net 1809.77 ml   Filed Weights   02/19/21 1031  Weight: 108.7 kg  Weight change:   Intake/Output from previous day: 05/05 0701 - 05/06 0700 In: 3734.8 [P.O.:480; I.V.:2697.4; IV Piggyback:557.4] Out: 1925 [Urine:1925] Intake/Output this shift: No intake/output data recorded. Filed Weights   02/19/21 1031  Weight: 108.7 kg    Examination: general exam: AAOx x older than stated age, weak appearing. HEENT:Oral mucosa moist, Ear/Nose WNL grossly, dentition normal. Respiratory system: bilaterally diminished, no crackles or wheezing, no use of accessory muscle Cardiovascular system: S1 & S2 +, No JVD,. Gastrointestinal system: Abdomen soft, NT,ND, BS+ Nervous System:Alert, awake, moving extremities and grossly nonfocal Extremities: No edema, distal peripheral pulses palpable.  Skin: No rashes,no icterus. MSK: Normal muscle bulk,tone, power  Data Reviewed: I have personally reviewed following labs and imaging studies CBC: Recent Labs  Lab 02/19/21 1030 02/20/21 0413 02/21/21 0420  WBC 15.4* 6.7 7.2  NEUTROABS 13.4*  --   --   HGB 12.2* 9.5* 10.6*  HCT 37.0* 30.0* 31.6*  MCV 93.2 94.9 91.3  PLT 189 134* [id number]   Basic Metabolic Panel: Recent Labs  Lab 02/19/21 1030 02/20/21 0413 02/21/21 0420  NA 138 141 143  K 4.6 3.7 3.6  CL 103 106 107  CO2 24 29 28   GLUCOSE 142* 91 121*  BUN 20 15 13   CREATININE 1.17 0.76 0.84  CALCIUM 8.7* 8.4* 8.6*   GFR: Estimated Creatinine Clearance: 117.9 mL/min (by C-G formula based on SCr of 0.84 mg/dL). Liver Function Tests: Recent Labs  Lab 02/19/21 1030 02/21/21 0420  AST 32 53*  ALT 26 33  ALKPHOS 70 58   BILITOT 0.8 1.1  PROT 7.1 5.8*  ALBUMIN 4.2 3.3*   No results for input(s): LIPASE, AMYLASE in the last 168 hours. No results for input(s): AMMONIA in the last 168 hours. Coagulation Profile: No results for input(s): INR, PROTIME in the last 168 hours. Cardiac Enzymes: Recent Labs  Lab 02/19/21 1030 02/20/21 0413 02/21/21 0420  CKTOTAL 884* 3,193* 1,867*   BNP (last 3 results) No results for input(s): PROBNP in the last 8760 hours. HbA1C: No results for input(s): HGBA1C in the last 72 hours. CBG: No results for input(s): GLUCAP in the last 168 hours. Lipid Profile: No results for input(s): CHOL, HDL, LDLCALC, TRIG, CHOLHDL, LDLDIRECT in the last 72 hours. Thyroid Function Tests: No results for input(s): TSH, T4TOTAL, FREET4, T3FREE, THYROIDAB in the last 72 hours. Anemia Panel: No results for input(s): VITAMINB12, FOLATE, FERRITIN, TIBC, IRON, RETICCTPCT in the last 72 hours. Sepsis Labs: Recent Labs  Lab 02/19/21 1029 02/19/21 1030  PROCALCITON 0.43  --   LATICACIDVEN  --  1.9    Recent Results (from the past 240 hour(s))  Culture, blood (routine x 2)     Status: None (Preliminary result)   Collection Time: 02/19/21 10:29 AM   Specimen: BLOOD  Result Value Ref Range Status   Specimen Description BLOOD BLOOD RIGHT HAND  Final   Special Requests   Final    BOTTLES DRAWN AEROBIC AND ANAEROBIC Blood Culture adequate volume   Culture  Setup Time   Final    GRAM POSITIVE COCCI AEROBIC BOTTLE ONLY CRITICAL VALUE NOTED.  VALUE IS CONSISTENT WITH PREVIOUSLY REPORTED AND CALLED VALUE. Performed at [facility], [address] [place], [place] [address]    Culture [facility] POSITIVE COCCI  Final   Report Status PENDING  Incomplete  Culture, blood (routine x 2)     Status: None (Preliminary result)   Collection Time: 02/19/21 10:30 AM   Specimen: BLOOD  Result Value Ref  Range Status   Specimen Description BLOOD RIGHT ANTECUBITAL  Final   Special Requests   Final     BOTTLES DRAWN AEROBIC AND ANAEROBIC Blood Culture adequate volume   Culture  Setup Time   Final    Organism ID to follow GRAM POSITIVE COCCI AEROBIC BOTTLE ONLY CRITICAL RESULT CALLED TO, READ BACK BY AND VERIFIED WITH: ALEX CHAPPELL AT [place] ON 02/20/2021 [place]. Performed at [facility], [address] [place], [place] [address]    Culture [facility] POSITIVE COCCI  Final   Report Status PENDING  Incomplete  Blood Culture ID Panel (Reflexed)     Status: Abnormal   Collection Time: 02/19/21 10:30 AM  Result Value Ref Range Status   Enterococcus faecalis NOT DETECTED NOT DETECTED Final   Enterococcus Faecium NOT DETECTED NOT DETECTED Final   Listeria monocytogenes NOT DETECTED NOT DETECTED Final   Staphylococcus species DETECTED (A) NOT DETECTED Final    Comment: CRITICAL RESULT CALLED TO, READ BACK BY AND VERIFIED WITH: ALEX CHAPPELL AT 8299 ON 02/20/2021 [place].    Staphylococcus aureus (BCID) NOT DETECTED NOT DETECTED Final   Staphylococcus epidermidis DETECTED (A) NOT DETECTED Final    Comment: CRITICAL RESULT CALLED TO, READ BACK BY AND VERIFIED WITH: ALEX CHAPPELL AT 3716 ON 02/20/2021 [place].    Staphylococcus lugdunensis NOT DETECTED NOT DETECTED Final   Streptococcus species NOT DETECTED NOT DETECTED Final   Streptococcus agalactiae NOT DETECTED NOT DETECTED Final   Streptococcus pneumoniae NOT DETECTED NOT DETECTED Final   Streptococcus pyogenes NOT DETECTED NOT DETECTED Final   A.calcoaceticus-baumannii NOT DETECTED NOT DETECTED Final   Bacteroides fragilis NOT DETECTED NOT DETECTED Final   Enterobacterales NOT DETECTED NOT DETECTED Final   Enterobacter cloacae complex NOT DETECTED NOT DETECTED Final   Escherichia coli NOT DETECTED NOT DETECTED Final   Klebsiella aerogenes NOT DETECTED NOT DETECTED Final   Klebsiella oxytoca NOT DETECTED NOT DETECTED Final   Klebsiella pneumoniae NOT DETECTED NOT DETECTED Final   Proteus species NOT DETECTED NOT DETECTED Final    Salmonella species NOT DETECTED NOT DETECTED Final   Serratia marcescens NOT DETECTED NOT DETECTED Final   Haemophilus influenzae NOT DETECTED NOT DETECTED Final   Neisseria meningitidis NOT DETECTED NOT DETECTED Final   Pseudomonas aeruginosa NOT DETECTED NOT DETECTED Final   Stenotrophomonas maltophilia NOT DETECTED NOT DETECTED Final   Candida albicans NOT DETECTED NOT DETECTED Final   Candida auris NOT DETECTED NOT DETECTED Final   Candida glabrata NOT DETECTED NOT DETECTED Final   Candida krusei NOT DETECTED NOT DETECTED Final   Candida parapsilosis NOT DETECTED NOT DETECTED Final   Candida tropicalis NOT DETECTED NOT DETECTED Final   Cryptococcus neoformans/gattii NOT DETECTED NOT DETECTED Final   Methicillin resistance mecA/C NOT DETECTED NOT DETECTED Final    Comment: Performed at [facility], [address] [place], [place] [address]  Resp Panel by RT-PCR (Flu A&B, Covid) Nasopharyngeal Swab     Status: None   Collection Time: 02/19/21 10:38 AM   Specimen: Nasopharyngeal Swab; Nasopharyngeal(NP) swabs in vial transport medium  Result Value Ref Range Status   SARS Coronavirus 2 by RT PCR NEGATIVE NEGATIVE Final    Comment: (NOTE) SARS-CoV-2 target nucleic acids are NOT DETECTED.  The SARS-CoV-2 RNA is generally detectable in upper respiratory specimens during the acute phase of infection. The lowest concentration of SARS-CoV-2 viral copies this assay can detect is 138 copies/mL. A negative result does not preclude SARS-Cov-2 infection and should not be used as the  sole basis for treatment or other patient management decisions. A negative result may occur with  improper specimen collection/handling, submission of specimen other than nasopharyngeal swab, presence of viral mutation(s) within the areas targeted by this assay, and inadequate number of viral copies(<138 copies/mL). A negative result must be combined with clinical observations, patient history, and  epidemiological information. The expected result is Negative.  Fact Sheet for Patients:  EntrepreneurPulse.com.au  Fact Sheet for Healthcare Providers:  IncredibleEmployment.be  This test is no t yet approved or cleared by the [place] FDA and  has been authorized for detection and/or diagnosis of SARS-CoV-2 by FDA under an Emergency Use Authorization (EUA). This EUA will remain  in effect (meaning this test can be used) for the duration of the COVID-19 declaration under Section 564(b)(1) of the Act, 21 U.S.C.section 360bbb-3(b)(1), unless the authorization is terminated  or revoked sooner.       Influenza A by PCR NEGATIVE NEGATIVE Final   Influenza B by PCR NEGATIVE NEGATIVE Final    Comment: (NOTE) The Xpert Xpress SARS-CoV-2/FLU/RSV plus assay is intended as an aid in the diagnosis of influenza from Nasopharyngeal swab specimens and should not be used as a sole basis for treatment. Nasal washings and aspirates are unacceptable for Xpert Xpress SARS-CoV-2/FLU/RSV testing.  Fact Sheet for Patients: EntrepreneurPulse.com.au  Fact Sheet for Healthcare Providers: IncredibleEmployment.be  This test is not yet approved or cleared by the [place] FDA and has been authorized for detection and/or diagnosis of SARS-CoV-2 by FDA under an Emergency Use Authorization (EUA). This EUA will remain in effect (meaning this test can be used) for the duration of the COVID-19 declaration under Section 564(b)(1) of the Act, 21 U.S.C. section 360bbb-3(b)(1), unless the authorization is terminated or revoked.  Performed at [facility], [address], [place], [place] [address]      Radiology Studies: CT Head Wo Contrast  Result Date: 02/19/2021 CLINICAL DATA:  Mental status change since last night. Difficulty communicating. EXAM: CT HEAD WITHOUT CONTRAST TECHNIQUE: Contiguous axial images were  obtained from the base of the skull through the vertex without intravenous contrast. COMPARISON:  CT head 04/18/2019. FINDINGS: Brain: There is no evidence of acute intracranial hemorrhage, mass lesion, brain edema or extra-axial fluid collection. The ventricles and subarachnoid spaces are appropriately sized for age. There is no CT evidence of acute cortical infarction. Vascular: Mild intracranial atherosclerosis. No hyperdense vessel identified. Skull: Negative for fracture or focal lesion. Sinuses/Orbits: Mild mucosal thickening in the paranasal sinuses, similar to previous study. No air-fluid levels identified. The mastoid air cells and middle ears are clear. No orbital abnormalities are seen. Other: None. IMPRESSION: 1. Stable CT of the head.  No acute intracranial findings. 2. Mild paranasal sinus disease, similar to previous CT. Electronically Signed   By: Richardean Sale M.D.   On: 02/19/2021 11:30   DG Chest Portable 1 View  Result Date: 02/19/2021 CLINICAL DATA:  Weakness, altered mental status; history of melanoma, GERD, smoker EXAM: PORTABLE CHEST 1 VIEW COMPARISON:  Portable exam 1034 hours compared to 05/12/2019 FINDINGS: Normal heart size, mediastinal contours, and pulmonary vascularity. RIGHT perihilar opacity extending in the RIGHT upper lobe, favor consolidation but cannot exclude underlying mass/adenopathy. Remaining lungs clear. No pleural effusion or pneumothorax. Osseous structures unremarkable. IMPRESSION: RIGHT upper lobe infiltrate consistent with pneumonia. Significant RIGHT perihilar opacity may reflect consolidation but underlying mass and adenopathy are not excluded; either radiographic follow-up until resolution or CT imaging recommended to exclude tumor and adenopathy. Electronically Signed  By: Lavonia Dana M.D.   On: 02/19/2021 11:15     LOS: 2 days   Antonieta Pert, MD [facility]  02/21/2021, 7:42 AM

## 2021-02-21 NOTE — Progress Notes (Signed)
Mobility Specialist - Progress Note   02/21/21 1500  Mobility  Activity Refused mobility  Mobility performed by Mobility specialist    Pt resting in bed on arrival, politely declined mobility this date d/t wanting to rest at this time. Will attempt session another date/time as appropriate.    Kathee Delton Mobility Specialist 02/21/21, 3:22 PM

## 2021-02-22 LAB — BASIC METABOLIC PANEL
Anion gap: 6 (ref 5–15)
BUN: 9 mg/dL (ref 8–23)
CO2: 26 mmol/L (ref 22–32)
Calcium: 8.5 mg/dL — ABNORMAL LOW (ref 8.9–10.3)
Chloride: 109 mmol/L (ref 98–111)
Creatinine, Ser: 0.8 mg/dL (ref 0.61–1.24)
GFR, Estimated: >60 mL/min (ref >60)
Glucose, Bld: 105 mg/dL — ABNORMAL HIGH (ref 70–99)
Potassium: 3.4 mmol/L — ABNORMAL LOW (ref 3.5–5.1)
Sodium: 141 mmol/L (ref 135–145)

## 2021-02-22 LAB — CULTURE, BLOOD (ROUTINE X 2)
Special Requests: ADEQUATE
Special Requests: ADEQUATE

## 2021-02-22 LAB — ECHOCARDIOGRAM COMPLETE
AR max vel: 1.93 cm2
AV Area VTI: 2.05 cm2
AV Area mean vel: 1.95 cm2
AV Mean grad: 7 mmHg
AV Peak grad: 15.1 mmHg
Ao pk vel: 1.94 m/s
Area-P 1/2: 1.83 cm2
Height: 73 in
S' Lateral: 2.78 cm
Weight: 3834.24 oz

## 2021-02-22 LAB — CBC
HCT: 31.6 % — ABNORMAL LOW (ref 39.0–52.0)
Hemoglobin: 10.5 g/dL — ABNORMAL LOW (ref 13.0–17.0)
MCH: 30 pg (ref 26.0–34.0)
MCHC: 33.2 g/dL (ref 30.0–36.0)
MCV: 90.3 fL (ref 80.0–100.0)
Platelets: 157 10*3/uL (ref 150–400)
RBC: 3.5 MIL/uL — ABNORMAL LOW (ref 4.22–5.81)
RDW: 12.8 % (ref 11.5–15.5)
WBC: 6.8 10*3/uL (ref 4.0–10.5)
nRBC: 0 % (ref 0.0–0.2)

## 2021-02-22 LAB — CK: Total CK: 866 U/L — ABNORMAL HIGH (ref 49–397)

## 2021-02-22 MED ORDER — ATORVASTATIN CALCIUM 40 MG PO TABS: 1.0000 | ORAL_TABLET | Freq: Every day | ORAL | 0 refills | Status: DC

## 2021-02-22 MED ORDER — AMOXICILLIN-POT CLAVULANATE 875-125 MG PO TABS: 1.0000 | ORAL_TABLET | Freq: Two times a day (BID) | ORAL | 0 refills | Status: AC

## 2021-02-22 MED ORDER — AMOXICILLIN-POT CLAVULANATE 875-125 MG PO TABS: 1.0000 | ORAL_TABLET | Freq: Two times a day (BID) | ORAL | 0 refills | Status: DC

## 2021-02-22 MED ORDER — LISINOPRIL 5 MG PO TABS: 10.0000 mg | ORAL_TABLET | Freq: Every day | ORAL | 0 refills | Status: DC

## 2021-02-22 MED ORDER — POTASSIUM CHLORIDE CRYS ER 20 MEQ PO TBCR
40.0000 meq | EXTENDED_RELEASE_TABLET | Freq: Once | ORAL | Status: AC
  Administered 2021-02-22: 40 meq via ORAL
  Filled 2021-02-22: qty 2

## 2021-02-22 NOTE — Discharge Summary (Signed)
Physician Discharge Summary  Anthony Nguyen [id number] DOB: 1956-05-22 DOA: 02/19/2021  PCP: Nelle Don, MD  Admit date: 02/19/2021 Discharge date: 02/22/2021  Admitted From:Home Disposition:Home  Recommendations for Outpatient Follow-up:  1. Follow up with PCP in 1-2 weeks. 2. Please obtain BMP/CBC in one week.  Home Health:No  Equipment/Devices:None  Discharge Condition: Stable Code Status:   Code Status: Full Code Diet recommendation:  Diet Order            Diet - low sodium heart healthy           Diet regular Room service appropriate? Yes; Fluid consistency: Thin  Diet effective now                 Brief/Interim Summary: 65 year old male with history of hypertension, HLD, GERD, depression, RLS, diverticulitis history, melanoma, smoker, BPPV, BPH, chronic pain syndrome on oxycodone presented with altered mental status. As per the report " patient was found to be confused, somnolent, sleepier since last night (02/18/21). He has slurred speech. Hehad fallen asleep on the couch around 4 AMandthen woke in morning to find him face down on the ground.He has some blood around his nose and month. In ED was confused, lethargic, somnolent, but arousable- Girlfriend is concerned that he may be taking more pain medication. He possibly took 7-8 pills ofOxycodonedue toworsening back pain. He has dry cough,his oxygen saturation 90% on room air, which improved to 97% of 3 L oxygen. Nofever. No chest pain. Per his girlfriend, pt is not using oxygen normally at home.Pt has possible OSA per hisgirlfriend.  ED Course:pt was found to have WBC 15.4, pending BMP, negative COVID PCR, alcohol level less than 10, CK8 84, salicylate level less than 7, lactic acid level 1.9, GFR>60, temperature 99.6, softer blood pressure, tachycardia with heart rate of 115, RR 18, chest x-ray showed right upper lobe infiltration and right perihilar opacity.CT of head is negative for acute  intracranial abnormalities.  Patient is admitted managed with IV antibiotics. Mental status significantly improved.  He was managed for rhabdomyolysis and improved w/ IVF.  He has stable right upper lobe infiltrate.  Blood culture came back positive with staph epidermidis from 5/4. Managed with Unasyn.  ID was consulted and underwent MRI of the spine no discitis or osteomyelitis, echo was also ordered and if its negative ID recommended to discharge home on Augmentin x10 days. Repeat blood culture sent 5/6 and negative so far.  Discharge Diagnoses:  Aspiration pneumonia in the setting of altered mental status: MSSE Sepsis:Aerobic bottle of both sets w/ BCID= MSSE. Acute hypoxic respiratory failure due to #1-SPO2 88% on room air in the ED Blood culture negative from 5/6, seen by ID, off oxygen.  Leukocytosis resolved afebrile clinically stable mental status at baseline.  Patient able to come off oxygen.  Leukocytosis resolved.  He is afebrile.SEEN BY id- underwent MRI of the spine no discitis or osteomyelitis, echo was also ordered and if its negative ID recommended to discharge home on Augmentin x10 days.Repeat blood culture sent 5/6 and negative so far. Recent Labs  Lab 02/19/21 1029 02/19/21 1030 02/20/21 0413 02/21/21 0420 02/22/21 0415  WBC  --  15.4* 6.7 7.2 6.8  LATICACIDVEN  --  1.9  --   --   --   PROCALCITON 0.43  --   --   --   --    Acute metabolic encephalopathy, suspected pain medication related.Received Narcan in the ED CT head no acute finding, nonfocal on exam. UDS  positive for opiate and benzo.  He is currently back to baseline.  Rhabdomyolysis: Likely from laying on the floor.  CK downtrending continue to hold statin for another week encourage oral hydration at home.   Right perihilar opacity chest x-ray no acute consolidation but underlying mass cannot exclude same repeat x-ray/CT chest after pneumonia treatment IN 3 WKS.  Chronic pain syndrome: On chronic oxycodone  -continue with caution follow-up you MD, continue Neurontin   BPH W/ urinary frequency: Continue his Flomax. Off foley.  Essential hypertension: Continue home lisinopril.  Likely high from IV fluid hydration as well.  Depression continue Cymbalta  HLD: Holding statin due to rhabdomyolysis  Morbid obesity BMI 31.  Will benefit with weight loss in the lifestyle.  2D Eecho 1. Left ventricular ejection fraction, by estimation, is 70 to 75%. The  left ventricle has normal function. The left ventricle has no regional  wall motion abnormalities. Left ventricular diastolic parameters were  normal.  2. Right ventricular systolic function is normal. The right ventricular  size is normal.  3. Left atrial size was mildly dilated.  4. The mitral valve is grossly normal. Trivial mitral valve  regurgitation.  5. The aortic valve is grossly normal. Aortic valve regurgitation is not  Visualized.  Consults:  ID  Subjective: aaox3, no new complaints Eager to go home today. Discharge Exam: Vitals:   02/22/21 0900 02/22/21 1102  BP: (!) 148/67 (!) 158/76  Pulse: 72 (!) 56  Resp: 16 18  Temp: 98.3 F (36.8 C) 98.2 F (36.8 C)  SpO2: 98% 96%   General: Pt is alert, awake, not in acute distress Cardiovascular: RRR, S1/S2 +, no rubs, no gallops Respiratory: CTA bilaterally, no wheezing, no rhonchi Abdominal: Soft, NT, ND, bowel sounds + Extremities: no edema, no cyanosis  Discharge Instructions  Discharge Instructions    Diet - low sodium heart healthy   Complete by: As directed    Discharge instructions   Complete by: As directed    Please call call MD or return to ER for similar or worsening recurring problem that brought you to hospital or if any fever,nausea/vomiting,abdominal pain, uncontrolled pain, chest pain,  shortness of breath or any other alarming symptoms.  Please follow-up your doctor as instructed in a week time and call the office for appointment.  Please avoid  alcohol, smoking, or any other illicit substance and maintain healthy habits including taking your regular medications as prescribed.  You were cared for by a hospitalist during your hospital stay. If you have any questions about your discharge medications or the care you received while you were in the hospital after you are discharged, you can call the unit and ask to speak with the hospitalist on call if the hospitalist that took care of you is not available.  Once you are discharged, your primary care physician will handle any further medical issues. Please note that NO REFILLS for any discharge medications will be authorized once you are discharged, as it is imperative that you return to your primary care physician (or establish a relationship with a primary care physician if you do not have one) for your aftercare needs so that they can reassess your need for medications and monitor your lab values   Increase activity slowly   Complete by: As directed      Allergies as of 02/22/2021      Reactions   Shellfish Allergy Swelling   Oysters and fish last time he consumed caused swelling in the bottom  legs       Medication List    TAKE these medications   albuterol 108 (90 Base) MCG/ACT inhaler Commonly known as: VENTOLIN HFA Inhale into the lungs.   amoxicillin-clavulanate 875-125 MG tablet Commonly known as: Augmentin Take 1 tablet by mouth 2 (two) times daily for 8 days.   atorvastatin 40 MG tablet Commonly known as: LIPITOR Take 1 tablet (40 mg total) by mouth daily. Start taking on: Mar 01, 2021 What changed: These instructions start on Mar 01, 2021. If you are unsure what to do until then, ask your doctor or other care provider.   Diclofenac Sodium 3 % Gel Place 1 application onto the skin 3 (three) times daily as needed (left knee pain).   DULoxetine 30 MG capsule Commonly known as: CYMBALTA Take by mouth.   gabapentin 600 MG tablet Commonly known as: NEURONTIN Take 600  mg by mouth 3 (three) times daily.   lisinopril 5 MG tablet Commonly known as: ZESTRIL Take 2 tablets (10 mg total) by mouth daily. What changed: how much to take   nicotine 21 mg/24hr patch Commonly known as: NICODERM CQ - dosed in mg/24 hours Place onto the skin.   Oxycodone HCl 10 MG Tabs Take 1 tablet (10 mg total) by mouth 3 (three) times daily as needed.   silodosin 8 MG Caps capsule Commonly known as: RAPAFLO SMARTSIG:1 Capsule(s) By Mouth Every Evening   tiZANidine 4 MG tablet Commonly known as: ZANAFLEX Take 1 tablet (4 mg total) by mouth every 8 (eight) hours as needed for muscle spasms.       Follow-up Information    Nelle Don, MD Follow up in 1 week(s).   Specialty: Family Medicine Contact information: [address] [place] [address] [phone]              Allergies  Allergen Reactions  . Shellfish Allergy Swelling    Oysters and fish last time he consumed caused swelling in the bottom legs     The results of significant diagnostics from this hospitalization (including imaging, microbiology, ancillary and laboratory) are listed below for reference.    Microbiology: Recent Results (from the past 240 hour(s))  Culture, blood (routine x 2)     Status: Abnormal   Collection Time: 02/19/21 10:29 AM   Specimen: BLOOD  Result Value Ref Range Status   Specimen Description   Final    BLOOD BLOOD RIGHT HAND Performed at [facility], [address], [place], [place] [address]    Special Requests   Final    BOTTLES DRAWN AEROBIC AND ANAEROBIC Blood Culture adequate volume Performed at [facility], [address] [place], [place] [address]    Culture  Setup Time   Final    GRAM POSITIVE COCCI AEROBIC BOTTLE ONLY CRITICAL VALUE NOTED.  VALUE IS CONSISTENT WITH PREVIOUSLY REPORTED AND CALLED VALUE. Performed at [facility], [address] [place], [place] [address]    Culture (A)  Final    STAPHYLOCOCCUS  EPIDERMIDIS SUSCEPTIBILITIES PERFORMED ON PREVIOUS CULTURE WITHIN THE LAST 5 DAYS. Performed at [facility], [place] [address], [place], [place] [address]    Report Status 02/22/2021 FINAL  Final  Culture, blood (routine x 2)     Status: Abnormal   Collection Time: 02/19/21 10:30 AM   Specimen: BLOOD  Result Value Ref Range Status   Specimen Description   Final    BLOOD RIGHT ANTECUBITAL Performed at [place]  [address] [place], [place] [address]    Special Requests   Final    BOTTLES DRAWN AEROBIC AND ANAEROBIC Blood Culture adequate volume Performed at [facility], [address] [place], [place] [address]    Culture  Setup Time   Final    GRAM POSITIVE COCCI AEROBIC BOTTLE ONLY CRITICAL RESULT CALLED TO, READ BACK BY AND VERIFIED WITH: ALEX CHAPPELL AT [place] ON 02/20/2021 [place]. Performed at [facility], [place] [address], [place],  [address]    Culture STAPHYLOCOCCUS EPIDERMIDIS (A)  Final   Report Status 02/22/2021 FINAL  Final   Organism ID, Bacteria STAPHYLOCOCCUS EPIDERMIDIS  Final      Susceptibility   Staphylococcus epidermidis - MIC*    CIPROFLOXACIN <=0.5 SENSITIVE Sensitive     ERYTHROMYCIN <=0.25 SENSITIVE Sensitive     GENTAMICIN <=0.5 SENSITIVE Sensitive     OXACILLIN <=0.25 SENSITIVE Sensitive     TETRACYCLINE <=1 SENSITIVE Sensitive     VANCOMYCIN 1 SENSITIVE Sensitive     TRIMETH/SULFA <=10 SENSITIVE Sensitive     CLINDAMYCIN <=0.25 SENSITIVE Sensitive     RIFAMPIN <=0.5 SENSITIVE Sensitive     Inducible Clindamycin NEGATIVE Sensitive     * STAPHYLOCOCCUS EPIDERMIDIS  Blood Culture ID Panel (Reflexed)     Status: Abnormal   Collection Time: 02/19/21 10:30 AM  Result Value Ref Range Status   Enterococcus faecalis NOT DETECTED NOT DETECTED Final   Enterococcus Faecium NOT DETECTED NOT DETECTED Final   Listeria monocytogenes NOT DETECTED NOT DETECTED Final   Staphylococcus species DETECTED (A) NOT DETECTED Final     Comment: CRITICAL RESULT CALLED TO, READ BACK BY AND VERIFIED WITH: ALEX CHAPPELL AT [id number] ON 02/20/2021 [place].    Staphylococcus aureus (BCID) NOT DETECTED NOT DETECTED Final   Staphylococcus epidermidis DETECTED (A) NOT DETECTED Final    Comment: CRITICAL RESULT CALLED TO, READ BACK BY AND VERIFIED WITH: ALEX CHAPPELL AT [id number] ON 02/20/2021 [place].    Staphylococcus lugdunensis NOT DETECTED NOT DETECTED Final   Streptococcus species NOT DETECTED NOT DETECTED Final   Streptococcus agalactiae NOT DETECTED NOT DETECTED Final   Streptococcus pneumoniae NOT DETECTED NOT DETECTED Final   Streptococcus pyogenes NOT DETECTED NOT DETECTED Final   A.calcoaceticus-baumannii NOT DETECTED NOT DETECTED Final   Bacteroides fragilis NOT DETECTED NOT DETECTED Final   Enterobacterales NOT DETECTED NOT DETECTED Final   Enterobacter cloacae complex NOT DETECTED NOT DETECTED Final   Escherichia coli NOT DETECTED NOT DETECTED Final   Klebsiella aerogenes NOT DETECTED NOT DETECTED Final   Klebsiella oxytoca NOT DETECTED NOT DETECTED Final   Klebsiella pneumoniae NOT DETECTED NOT DETECTED Final   Proteus species NOT DETECTED NOT DETECTED Final   Salmonella species NOT DETECTED NOT DETECTED Final   Serratia marcescens NOT DETECTED NOT DETECTED Final   Haemophilus influenzae NOT DETECTED NOT DETECTED Final   Neisseria meningitidis NOT DETECTED NOT DETECTED Final   Pseudomonas aeruginosa NOT DETECTED NOT DETECTED Final   Stenotrophomonas maltophilia NOT DETECTED NOT DETECTED Final   Candida albicans NOT DETECTED NOT DETECTED Final   Candida auris NOT DETECTED NOT DETECTED Final   Candida glabrata NOT DETECTED NOT DETECTED Final   Candida krusei NOT DETECTED NOT DETECTED Final   Candida parapsilosis NOT DETECTED NOT DETECTED Final   Candida tropicalis NOT DETECTED NOT DETECTED Final   Cryptococcus neoformans/gattii NOT DETECTED NOT DETECTED Final   Methicillin resistance mecA/C NOT DETECTED NOT DETECTED Final     Comment: Performed at [facility], [place]  [address] [place], [place] [address]  Resp Panel by RT-PCR (Flu A&B, Covid) Nasopharyngeal Swab     Status: None   Collection Time: 02/19/21 10:38 AM   Specimen: Nasopharyngeal Swab; Nasopharyngeal(NP) swabs in vial transport medium  Result Value Ref Range Status   SARS Coronavirus 2 by RT PCR NEGATIVE NEGATIVE Final    Comment: (NOTE) SARS-CoV-2 target nucleic acids are NOT DETECTED.  The SARS-CoV-2 RNA is generally detectable in upper respiratory specimens during the acute phase of infection. The lowest concentration of SARS-CoV-2 viral copies this assay can detect is 138 copies/mL. A negative result does not preclude SARS-Cov-2 infection and should not be used as the sole basis for treatment or other patient management decisions. A negative result may occur with  improper specimen collection/handling, submission of specimen other than nasopharyngeal swab, presence of viral mutation(s) within the areas targeted by this assay, and inadequate number of viral copies(<138 copies/mL). A negative result must be combined with clinical observations, patient history, and epidemiological information. The expected result is Negative.  Fact Sheet for Patients:  EntrepreneurPulse.com.au  Fact Sheet for Healthcare Providers:  IncredibleEmployment.be  This test is no t yet approved or cleared by the [place] FDA and  has been authorized for detection and/or diagnosis of SARS-CoV-2 by FDA under an Emergency Use Authorization (EUA). This EUA will remain  in effect (meaning this test can be used) for the duration of the COVID-19 declaration under Section 564(b)(1) of the Act, 21 U.S.C.section 360bbb-3(b)(1), unless the authorization is terminated  or revoked sooner.       Influenza A by PCR NEGATIVE NEGATIVE Final   Influenza B by PCR NEGATIVE NEGATIVE Final    Comment: (NOTE) The Xpert Xpress  SARS-CoV-2/FLU/RSV plus assay is intended as an aid in the diagnosis of influenza from Nasopharyngeal swab specimens and should not be used as a sole basis for treatment. Nasal washings and aspirates are unacceptable for Xpert Xpress SARS-CoV-2/FLU/RSV testing.  Fact Sheet for Patients: EntrepreneurPulse.com.au  Fact Sheet for Healthcare Providers: IncredibleEmployment.be  This test is not yet approved or cleared by the [place] FDA and has been authorized for detection and/or diagnosis of SARS-CoV-2 by FDA under an Emergency Use Authorization (EUA). This EUA will remain in effect (meaning this test can be used) for the duration of the COVID-19 declaration under Section 564(b)(1) of the Act, 21 U.S.C. section 360bbb-3(b)(1), unless the authorization is terminated or revoked.  Performed at [facility], [address] [place], [place] [address]   Culture, blood (single) w Reflex to ID Panel     Status: None (Preliminary result)   Collection Time: 02/21/21 10:41 AM   Specimen: BLOOD  Result Value Ref Range Status   Specimen Description BLOOD LEFT ARM  Final   Special Requests   Final    BOTTLES DRAWN AEROBIC AND ANAEROBIC Blood Culture adequate volume   Culture   Final    NO GROWTH < 24 HOURS Performed at [facility], [address] [place], [place] [address]    Report Status PENDING  Incomplete    Procedures/Studies: CT Head Wo Contrast  Result Date: 02/19/2021 CLINICAL DATA:  Mental status change since last night. Difficulty communicating. EXAM: CT HEAD WITHOUT CONTRAST TECHNIQUE: Contiguous axial images were obtained from the base of the skull through the vertex without intravenous contrast. COMPARISON:  CT head 04/18/2019. FINDINGS: Brain: There is no evidence of acute intracranial hemorrhage, mass lesion, brain edema or extra-axial fluid collection. The ventricles and subarachnoid spaces are  appropriately sized  for age. There is no CT evidence of acute cortical infarction. Vascular: Mild intracranial atherosclerosis. No hyperdense vessel identified. Skull: Negative for fracture or focal lesion. Sinuses/Orbits: Mild mucosal thickening in the paranasal sinuses, similar to previous study. No air-fluid levels identified. The mastoid air cells and middle ears are clear. No orbital abnormalities are seen. Other: None. IMPRESSION: 1. Stable CT of the head.  No acute intracranial findings. 2. Mild paranasal sinus disease, similar to previous CT. Electronically Signed   By: Richardean Sale M.D.   On: 02/19/2021 11:30   MR Lumbar Spine W Wo Contrast  Result Date: 02/21/2021 CLINICAL DATA:  Low back pain and bacteremia.  With EXAM: MRI LUMBAR SPINE WITHOUT AND WITH CONTRAST TECHNIQUE: Multiplanar and multiecho pulse sequences of the lumbar spine were obtained without and with intravenous contrast. CONTRAST:  46mL GADAVIST GADOBUTROL 1 MMOL/ML IV SOLN COMPARISON:  CT abdomen pelvis dated March 22, 2019. CT lumbar spine dated Mar 14, 2018. MRI lumbar spine dated Feb 26, 2017. FINDINGS: Segmentation:  Standard. Alignment: Unchanged 5 mm anterolisthesis at L5-S1 due to chronic bilateral L5 pars defects. Vertebrae:  No fracture, evidence of discitis, or bone lesion. Conus medullaris and cauda equina: Conus extends to the L1-L2 level. Conus and cauda equina appear normal. No intradural enhancement. Paraspinal and other soft tissues: Negative. Disc levels: T12-L1:  Negative. L1-L2:  Negative. L2-L3: Unchanged mild disc bulging. Unchanged mild spinal canal stenosis. No neuroforaminal stenosis. L3-L4:  Unchanged mild disc bulging.  No stenosis. L4-L5: Negative disc. Unchanged mild bilateral facet arthropathy. No stenosis. L5-S1: Unchanged disc uncovering and mild disc bulging. Unchanged mild bilateral facet arthropathy. Unchanged mild right greater than left neuroforaminal stenosis. No spinal canal stenosis. IMPRESSION: 1. No evidence of  discitis-osteomyelitis. 2. Unchanged grade 1 anterolisthesis at L5-S1 due to chronic bilateral L5 pars defects. 3. Unchanged mild multilevel spondylosis as described above. No high-grade stenosis or impingement. Electronically Signed   By: Titus Dubin M.D.   On: 02/21/2021 12:36   DG Chest Portable 1 View  Result Date: 02/19/2021 CLINICAL DATA:  Weakness, altered mental status; history of melanoma, GERD, smoker EXAM: PORTABLE CHEST 1 VIEW COMPARISON:  Portable exam 1034 hours compared to 05/12/2019 FINDINGS: Normal heart size, mediastinal contours, and pulmonary vascularity. RIGHT perihilar opacity extending in the RIGHT upper lobe, favor consolidation but cannot exclude underlying mass/adenopathy. Remaining lungs clear. No pleural effusion or pneumothorax. Osseous structures unremarkable. IMPRESSION: RIGHT upper lobe infiltrate consistent with pneumonia. Significant RIGHT perihilar opacity may reflect consolidation but underlying mass and adenopathy are not excluded; either radiographic follow-up until resolution or CT imaging recommended to exclude tumor and adenopathy. Electronically Signed   By: Lavonia Dana M.D.   On: 02/19/2021 11:15   ECHOCARDIOGRAM COMPLETE  Result Date: 02/22/2021    ECHOCARDIOGRAM REPORT   Patient Name:   Jaeger MELROSE SANDGREN Date of Exam: 02/21/2021 Medical Rec #:  [id number]          Height:       73.0 in Accession #:    [id number]         Weight:       239.6 lb Date of Birth:  [birth year]          BSA:          2.324 m Patient Age:    65 years           BP:           186/87 mmHg Patient Gender: M  HR:           72 bpm. Exam Location:  [facility] Procedure: 2D Echo, Cardiac Doppler and Color Doppler Indications:     Bacteremia R78.81  History:         Patient has no prior history of Echocardiogram examinations.                  Risk Factors:Hypertension and Current Smoker.  Sonographer:     Kenton Kingfisher, ANN Referring Phys:  [place] Diagnosing Phys: Bartholome Bill MD   Sonographer Comments: Suboptimal subcostal window. IMPRESSIONS  1. Left ventricular ejection fraction, by estimation, is 70 to 75%. The left ventricle has normal function. The left ventricle has no regional wall motion abnormalities. Left ventricular diastolic parameters were normal.  2. Right ventricular systolic function is normal. The right ventricular size is normal.  3. Left atrial size was mildly dilated.  4. The mitral valve is grossly normal. Trivial mitral valve regurgitation.  5. The aortic valve is grossly normal. Aortic valve regurgitation is not visualized. FINDINGS  Left Ventricle: Left ventricular ejection fraction, by estimation, is 70 to 75%. The left ventricle has normal function. The left ventricle has no regional wall motion abnormalities. The left ventricular internal cavity size was normal in size. There is  no left ventricular hypertrophy. Left ventricular diastolic parameters were normal. Right Ventricle: The right ventricular size is normal. No increase in right ventricular wall thickness. Right ventricular systolic function is normal. Left Atrium: Left atrial size was mildly dilated. Right Atrium: Right atrial size was normal in size. Pericardium: There is no evidence of pericardial effusion. Mitral Valve: The mitral valve is grossly normal. Trivial mitral valve regurgitation. Tricuspid Valve: The tricuspid valve is grossly normal. Tricuspid valve regurgitation is trivial. Aortic Valve: The aortic valve is grossly normal. Aortic valve regurgitation is not visualized. Aortic valve mean gradient measures 7.0 mmHg. Aortic valve peak gradient measures 15.1 mmHg. Aortic valve area, by VTI measures 2.05 cm. Pulmonic Valve: The pulmonic valve was not well visualized. Pulmonic valve regurgitation is trivial. Aorta: The aortic root is normal in size and structure. IAS/Shunts: No atrial level shunt detected by color flow Doppler.  LEFT VENTRICLE PLAX 2D LVIDd:         5.16 cm  Diastology LVIDs:          2.78 cm  LV e' medial:    53.96 cm/s LV PW:         1.17 cm  LV E/e' medial:  1.8 LV IVS:        1.16 cm  LV e' lateral:   10.60 cm/s LVOT diam:     2.00 cm  LV E/e' lateral: 9.2 LV SV:         92 LV SV Index:   39 LVOT Area:     3.14 cm  RIGHT VENTRICLE TAPSE (M-mode): 2.4 cm LEFT ATRIUM           Index LA diam:      3.10 cm 1.33 cm/m LA Vol (A4C): 39.3 ml 16.91 ml/m  AORTIC VALVE AV Area (Vmax):    1.93 cm AV Area (Vmean):   1.95 cm AV Area (VTI):     2.05 cm AV Vmax:           194.00 cm/s  PULMONARY ARTERY AV Vmean:          123.000 cm/s MPA diam:        1.70 cm AV VTI:  0.448 m AV Peak Grad:      15.1 mmHg AV Mean Grad:      7.0 mmHg LVOT Vmax:         119.00 cm/s LVOT Vmean:        76.500 cm/s LVOT VTI:          0.292 m LVOT/AV VTI ratio: 0.65  AORTA Ao Root diam: 3.20 cm Ao Asc diam:  2.50 cm MITRAL VALVE MV Area (PHT): 1.83 cm     SHUNTS MV Decel Time: 415 msec     Systemic VTI:  0.29 m MV E velocity: 97.65 cm/s   Systemic Diam: 2.00 cm MV A velocity: 105.00 cm/s MV E/A ratio:  0.93 Bartholome Bill MD Electronically signed by Bartholome Bill MD Signature Date/Time: 02/22/2021/2:48:44 PM    Final     Labs: BNP (last 3 results) Recent Labs    02/19/21 1032  BNP [id number]*   Basic Metabolic Panel: Recent Labs  Lab 02/19/21 1030 02/20/21 0413 02/21/21 0420 02/22/21 0415  NA 138 141 143 141  K 4.6 3.7 3.6 3.4*  CL 103 106 107 109  CO2 24 29 28 26   GLUCOSE 142* 91 121* 105*  BUN 20 15 13 9   CREATININE 1.17 0.76 0.84 0.80  CALCIUM 8.7* 8.4* 8.6* 8.5*   Liver Function Tests: Recent Labs  Lab 02/19/21 1030 02/21/21 0420  AST 32 53*  ALT 26 33  ALKPHOS 70 58  BILITOT 0.8 1.1  PROT 7.1 5.8*  ALBUMIN 4.2 3.3*   No results for input(s): LIPASE, AMYLASE in the last 168 hours. No results for input(s): AMMONIA in the last 168 hours. CBC: Recent Labs  Lab 02/19/21 1030 02/20/21 0413 02/21/21 0420 02/22/21 0415  WBC 15.4* 6.7 7.2 6.8  NEUTROABS 13.4*  --   --   --   HGB  12.2* 9.5* 10.6* 10.5*  HCT 37.0* 30.0* 31.6* 31.6*  MCV 93.2 94.9 91.3 90.3  PLT 189 134* 150 157   Cardiac Enzymes: Recent Labs  Lab 02/19/21 1030 02/20/21 0413 02/21/21 0420 02/22/21 0415  CKTOTAL 884* 3,193* 1,867* 866*   BNP: Invalid input(s): POCBNP CBG: No results for input(s): GLUCAP in the last 168 hours. D-Dimer No results for input(s): DDIMER in the last 72 hours. Hgb A1c No results for input(s): HGBA1C in the last 72 hours. Lipid Profile No results for input(s): CHOL, HDL, LDLCALC, TRIG, CHOLHDL, LDLDIRECT in the last 72 hours. Thyroid function studies No results for input(s): TSH, T4TOTAL, T3FREE, THYROIDAB in the last 72 hours.  Invalid input(s): FREET3 Anemia work up No results for input(s): VITAMINB12, FOLATE, FERRITIN, TIBC, IRON, RETICCTPCT in the last 72 hours. Urinalysis    Component Value Date/Time   COLORURINE YELLOW (A) 02/19/2021 1750   APPEARANCEUR HAZY (A) 02/19/2021 1750   APPEARANCEUR Clear 03/14/2014 1216   LABSPEC 1.017 02/19/2021 1750   LABSPEC 1.018 03/14/2014 1216   PHURINE 5.0 02/19/2021 1750   GLUCOSEU NEGATIVE 02/19/2021 1750   GLUCOSEU Negative 03/14/2014 1216   HGBUR NEGATIVE 02/19/2021 1750   BILIRUBINUR NEGATIVE 02/19/2021 1750   BILIRUBINUR Negative 03/14/2014 1216   KETONESUR NEGATIVE 02/19/2021 1750   PROTEINUR 30 (A) 02/19/2021 1750   UROBILINOGEN 0.2 03/17/2014 2208   NITRITE NEGATIVE 02/19/2021 1750   LEUKOCYTESUR NEGATIVE 02/19/2021 1750   LEUKOCYTESUR Negative 03/14/2014 1216   Sepsis Labs Invalid input(s): PROCALCITONIN,  WBC,  LACTICIDVEN Microbiology Recent Results (from the past 240 hour(s))  Culture, blood (routine x 2)     Status: Abnormal  Collection Time: 02/19/21 10:29 AM   Specimen: BLOOD  Result Value Ref Range Status   Specimen Description   Final    BLOOD BLOOD RIGHT HAND Performed at [facility], [address], [place], [place] [address]    Special Requests   Final    BOTTLES  DRAWN AEROBIC AND ANAEROBIC Blood Culture adequate volume Performed at [facility] [address] [place], [place] [address]    Culture  Setup Time   Final    GRAM POSITIVE COCCI AEROBIC BOTTLE ONLY CRITICAL VALUE NOTED.  VALUE IS CONSISTENT WITH PREVIOUSLY REPORTED AND CALLED VALUE. Performed at [facility], [address] [place], [place] [address]    Culture (A)  Final    STAPHYLOCOCCUS EPIDERMIDIS SUSCEPTIBILITIES PERFORMED ON PREVIOUS CULTURE WITHIN THE LAST 5 DAYS. Performed at [facility], [place] [address], [place], [place] [address]    Report Status 02/22/2021 FINAL  Final  Culture, blood (routine x 2)     Status: Abnormal   Collection Time: 02/19/21 10:30 AM   Specimen: BLOOD  Result Value Ref Range Status   Specimen Description   Final    BLOOD RIGHT ANTECUBITAL Performed at [facility] [place], [address], [place], [place] [address]    Special Requests   Final    BOTTLES DRAWN AEROBIC AND ANAEROBIC Blood Culture adequate volume Performed at [facility], [address] [place], [place] [address]    Culture  Setup Time   Final    GRAM POSITIVE COCCI AEROBIC BOTTLE ONLY CRITICAL RESULT CALLED TO, READ BACK BY AND VERIFIED WITH: ALEX CHAPPELL AT 2505 ON 02/20/2021 [place]. Performed at [facility], [place] [address] [place],  [address]    Culture STAPHYLOCOCCUS EPIDERMIDIS (A)  Final   Report Status 02/22/2021 FINAL  Final   Organism ID, Bacteria STAPHYLOCOCCUS EPIDERMIDIS  Final      Susceptibility   Staphylococcus epidermidis - MIC*    CIPROFLOXACIN <=0.5 SENSITIVE Sensitive     ERYTHROMYCIN <=0.25 SENSITIVE Sensitive     GENTAMICIN <=0.5 SENSITIVE Sensitive     OXACILLIN <=0.25 SENSITIVE Sensitive     TETRACYCLINE <=1 SENSITIVE Sensitive     VANCOMYCIN 1 SENSITIVE Sensitive     TRIMETH/SULFA <=10 SENSITIVE Sensitive     CLINDAMYCIN <=0.25 SENSITIVE Sensitive     RIFAMPIN <=0.5 SENSITIVE Sensitive      Inducible Clindamycin NEGATIVE Sensitive     * STAPHYLOCOCCUS EPIDERMIDIS  Blood Culture ID Panel (Reflexed)     Status: Abnormal   Collection Time: 02/19/21 10:30 AM  Result Value Ref Range Status   Enterococcus faecalis NOT DETECTED NOT DETECTED Final   Enterococcus Faecium NOT DETECTED NOT DETECTED Final   Listeria monocytogenes NOT DETECTED NOT DETECTED Final   Staphylococcus species DETECTED (A) NOT DETECTED Final    Comment: CRITICAL RESULT CALLED TO, READ BACK BY AND VERIFIED WITH: ALEX CHAPPELL AT 3419 ON 02/20/2021 [place].    Staphylococcus aureus (BCID) NOT DETECTED NOT DETECTED Final   Staphylococcus epidermidis DETECTED (A) NOT DETECTED Final    Comment: CRITICAL RESULT CALLED TO, READ BACK BY AND VERIFIED WITH: ALEX CHAPPELL AT 3790 ON 02/20/2021 [place].    Staphylococcus lugdunensis NOT DETECTED NOT DETECTED Final   Streptococcus species NOT DETECTED NOT DETECTED Final   Streptococcus agalactiae NOT DETECTED NOT DETECTED Final   Streptococcus pneumoniae NOT DETECTED NOT DETECTED Final   Streptococcus pyogenes NOT DETECTED NOT DETECTED Final   A.calcoaceticus-baumannii NOT DETECTED NOT DETECTED Final  Bacteroides fragilis NOT DETECTED NOT DETECTED Final   Enterobacterales NOT DETECTED NOT DETECTED Final   Enterobacter cloacae complex NOT DETECTED NOT DETECTED Final   Escherichia coli NOT DETECTED NOT DETECTED Final   Klebsiella aerogenes NOT DETECTED NOT DETECTED Final   Klebsiella oxytoca NOT DETECTED NOT DETECTED Final   Klebsiella pneumoniae NOT DETECTED NOT DETECTED Final   Proteus species NOT DETECTED NOT DETECTED Final   Salmonella species NOT DETECTED NOT DETECTED Final   Serratia marcescens NOT DETECTED NOT DETECTED Final   Haemophilus influenzae NOT DETECTED NOT DETECTED Final   Neisseria meningitidis NOT DETECTED NOT DETECTED Final   Pseudomonas aeruginosa NOT DETECTED NOT DETECTED Final   Stenotrophomonas maltophilia NOT DETECTED NOT DETECTED Final   Candida  albicans NOT DETECTED NOT DETECTED Final   Candida auris NOT DETECTED NOT DETECTED Final   Candida glabrata NOT DETECTED NOT DETECTED Final   Candida krusei NOT DETECTED NOT DETECTED Final   Candida parapsilosis NOT DETECTED NOT DETECTED Final   Candida tropicalis NOT DETECTED NOT DETECTED Final   Cryptococcus neoformans/gattii NOT DETECTED NOT DETECTED Final   Methicillin resistance mecA/C NOT DETECTED NOT DETECTED Final    Comment: Performed at [facility] [address] [place], [place] [address]  Resp Panel by RT-PCR (Flu A&B, Covid) Nasopharyngeal Swab     Status: None   Collection Time: 02/19/21 10:38 AM   Specimen: Nasopharyngeal Swab; Nasopharyngeal(NP) swabs in vial transport medium  Result Value Ref Range Status   SARS Coronavirus 2 by RT PCR NEGATIVE NEGATIVE Final    Comment: (NOTE) SARS-CoV-2 target nucleic acids are NOT DETECTED.  The SARS-CoV-2 RNA is generally detectable in upper respiratory specimens during the acute phase of infection. The lowest concentration of SARS-CoV-2 viral copies this assay can detect is 138 copies/mL. A negative result does not preclude SARS-Cov-2 infection and should not be used as the sole basis for treatment or other patient management decisions. A negative result may occur with  improper specimen collection/handling, submission of specimen other than nasopharyngeal swab, presence of viral mutation(s) within the areas targeted by this assay, and inadequate number of viral copies(<138 copies/mL). A negative result must be combined with clinical observations, patient history, and epidemiological information. The expected result is Negative.  Fact Sheet for Patients:  EntrepreneurPulse.com.au  Fact Sheet for Healthcare Providers:  IncredibleEmployment.be  This test is no t yet approved or cleared by the [place] FDA and  has been authorized for detection and/or diagnosis of SARS-CoV-2  by FDA under an Emergency Use Authorization (EUA). This EUA will remain  in effect (meaning this test can be used) for the duration of the COVID-19 declaration under Section 564(b)(1) of the Act, 21 U.S.C.section 360bbb-3(b)(1), unless the authorization is terminated  or revoked sooner.       Influenza A by PCR NEGATIVE NEGATIVE Final   Influenza B by PCR NEGATIVE NEGATIVE Final    Comment: (NOTE) The Xpert Xpress SARS-CoV-2/FLU/RSV plus assay is intended as an aid in the diagnosis of influenza from Nasopharyngeal swab specimens and should not be used as a sole basis for treatment. Nasal washings and aspirates are unacceptable for Xpert Xpress SARS-CoV-2/FLU/RSV testing.  Fact Sheet for Patients: EntrepreneurPulse.com.au  Fact Sheet for Healthcare Providers: IncredibleEmployment.be  This test is not yet approved or cleared by the [place] FDA and has been authorized for detection and/or diagnosis of SARS-CoV-2 by FDA under an Emergency Use Authorization (EUA). This EUA will remain in effect (meaning this test can be used)  for the duration of the COVID-19 declaration under Section 564(b)(1) of the Act, 21 U.S.C. section 360bbb-3(b)(1), unless the authorization is terminated or revoked.  Performed at [facility], [address] [place], [place] [address]   Culture, blood (single) w Reflex to ID Panel     Status: None (Preliminary result)   Collection Time: 02/21/21 10:41 AM   Specimen: BLOOD  Result Value Ref Range Status   Specimen Description BLOOD LEFT ARM  Final   Special Requests   Final    BOTTLES DRAWN AEROBIC AND ANAEROBIC Blood Culture adequate volume   Culture   Final    NO GROWTH < 24 HOURS Performed at [facility], [address] [place], [place] [address]    Report Status PENDING  Incomplete     Time coordinating discharge: 35 minutes  SIGNED: Antonieta Pert, MD  [facility] 02/22/2021,  3:08 PM  If 7PM-7AM, please contact night-coverage [email or web address]

## 2021-02-22 NOTE — Progress Notes (Signed)
Pt's PIV removed with tip intact. Discharge instructions explained with pt. Pt denies any questions or concerns. Pt to pick up meds from [facility] in [place]. Pt to schedule follow up apt on Monday.

## 2021-02-22 NOTE — Progress Notes (Signed)
Mobility Specialist - Progress Note   02/22/21 1400  Mobility  Activity Ambulated in hall  Level of Assistance Independent  Assistive Device None  Distance Ambulated (ft) 500 ft  Mobility Response Tolerated well  Mobility performed by Mobility specialist  $Mobility charge 1 Mobility    Pre-mobility: 65 HR, 96% SpO2 Post-mobility: 91 HR, 97% SpO2   Pt ambulated >500' in hallway. Independent. No LOB. No SOB on RA.    Kathee Delton Mobility Specialist 02/22/21, 2:30 PM

## 2021-02-26 LAB — CULTURE, BLOOD (SINGLE)
Culture: NO GROWTH
Special Requests: ADEQUATE

## 2021-03-11 ENCOUNTER — Encounter: Payer: Self-pay | Admitting: Student in an Organized Health Care Education/Training Program

## 2021-04-12 ENCOUNTER — Other Ambulatory Visit: Payer: Self-pay

## 2021-04-12 DIAGNOSIS — G8929 Other chronic pain: Secondary | ICD-10-CM | POA: Insufficient documentation

## 2021-04-12 DIAGNOSIS — M549 Dorsalgia, unspecified: Secondary | ICD-10-CM | POA: Insufficient documentation

## 2021-04-12 DIAGNOSIS — K219 Gastro-esophageal reflux disease without esophagitis: Secondary | ICD-10-CM | POA: Insufficient documentation

## 2021-04-12 DIAGNOSIS — Z79899 Other long term (current) drug therapy: Secondary | ICD-10-CM | POA: Insufficient documentation

## 2021-04-12 DIAGNOSIS — I10 Essential (primary) hypertension: Secondary | ICD-10-CM | POA: Insufficient documentation

## 2021-04-12 DIAGNOSIS — Z85820 Personal history of malignant melanoma of skin: Secondary | ICD-10-CM | POA: Insufficient documentation

## 2021-04-12 DIAGNOSIS — Z96652 Presence of left artificial knee joint: Secondary | ICD-10-CM | POA: Insufficient documentation

## 2021-04-12 DIAGNOSIS — K5900 Constipation, unspecified: Secondary | ICD-10-CM | POA: Insufficient documentation

## 2021-04-12 DIAGNOSIS — F17210 Nicotine dependence, cigarettes, uncomplicated: Secondary | ICD-10-CM | POA: Insufficient documentation

## 2021-04-12 LAB — CBC WITH DIFFERENTIAL/PLATELET
Abs Immature Granulocytes: 0.03 10*3/uL (ref 0.00–0.07)
Basophils Absolute: 0 10*3/uL (ref 0.0–0.1)
Basophils Relative: 0 %
Eosinophils Absolute: 0.1 10*3/uL (ref 0.0–0.5)
Eosinophils Relative: 1 %
HCT: 36 % — ABNORMAL LOW (ref 39.0–52.0)
Hemoglobin: 11.8 g/dL — ABNORMAL LOW (ref 13.0–17.0)
Immature Granulocytes: 0 %
Lymphocytes Relative: 15 %
Lymphs Abs: 1.5 10*3/uL (ref 0.7–4.0)
MCH: 30.3 pg (ref 26.0–34.0)
MCHC: 32.8 g/dL (ref 30.0–36.0)
MCV: 92.3 fL (ref 80.0–100.0)
Monocytes Absolute: 1.1 10*3/uL — ABNORMAL HIGH (ref 0.1–1.0)
Monocytes Relative: 12 %
Neutro Abs: 7.1 10*3/uL (ref 1.7–7.7)
Neutrophils Relative %: 72 %
Platelets: 153 10*3/uL (ref 150–400)
RBC: 3.9 MIL/uL — ABNORMAL LOW (ref 4.22–5.81)
RDW: 12.8 % (ref 11.5–15.5)
WBC: 9.8 10*3/uL (ref 4.0–10.5)
nRBC: 0 % (ref 0.0–0.2)

## 2021-04-12 LAB — BASIC METABOLIC PANEL
Anion gap: 8 (ref 5–15)
BUN: 12 mg/dL (ref 8–23)
CO2: 26 mmol/L (ref 22–32)
Calcium: 8.9 mg/dL (ref 8.9–10.3)
Chloride: 102 mmol/L (ref 98–111)
Creatinine, Ser: 0.84 mg/dL (ref 0.61–1.24)
GFR, Estimated: >60 mL/min (ref >60)
Glucose, Bld: 155 mg/dL — ABNORMAL HIGH (ref 70–99)
Potassium: 3.7 mmol/L (ref 3.5–5.1)
Sodium: 136 mmol/L (ref 135–145)

## 2021-04-12 MED ORDER — OXYCODONE-ACETAMINOPHEN 5-325 MG PO TABS
1.0000 | ORAL_TABLET | Freq: Once | ORAL | Status: AC
  Administered 2021-04-12: 1 via ORAL
  Filled 2021-04-12: qty 1

## 2021-04-12 NOTE — ED Triage Notes (Signed)
Pt states he has a spinal cord stimulator placed last Monday, and it was taken out "a few days ago", pt states he has severe back pain. Pt states it is hard for him to move around, pt denies any bowel or bladder incontinence.

## 2021-04-13 ENCOUNTER — Emergency Department
Admission: EM | Admit: 2021-04-13 | Discharge: 2021-04-13 | Disposition: A | Discharge status: Home / Self Care | Payer: Self-pay | Attending: Emergency Medicine | Admitting: Emergency Medicine

## 2021-04-13 DIAGNOSIS — K5900 Constipation, unspecified: Secondary | ICD-10-CM

## 2021-04-13 DIAGNOSIS — G8929 Other chronic pain: Secondary | ICD-10-CM

## 2021-04-13 MED ORDER — KETOROLAC TROMETHAMINE 30 MG/ML IJ SOLN
30.0000 mg | Freq: Once | INTRAMUSCULAR | Status: AC
  Administered 2021-04-13: 30 mg via INTRAMUSCULAR
  Filled 2021-04-13: qty 1

## 2021-04-13 MED ORDER — HYDROMORPHONE HCL 1 MG/ML IJ SOLN
1.0000 mg | INTRAMUSCULAR | Status: AC
  Administered 2021-04-13: 1 mg via INTRAMUSCULAR
  Filled 2021-04-13: qty 1

## 2021-04-13 NOTE — ED Notes (Signed)
Pt still c/o 10/10 pain. Medication given.

## 2021-04-13 NOTE — ED Notes (Signed)
Pt ambulated to toilet to try to urinate.

## 2021-04-13 NOTE — ED Notes (Signed)
Pt wife reports concerns that pt is not urinating very much. Pt wife reports pt has urinated "very little" since Thursday and is c/o severe lower abdominal pain. Will bladder scan pt and update EDP.

## 2021-04-13 NOTE — ED Notes (Signed)
EDP updated on pt

## 2021-04-13 NOTE — ED Provider Notes (Signed)
[facility] Emergency Department Provider Note  ____________________________________________   Event Date/Time   First MD Initiated Contact with Patient 04/13/21 0128     (approximate)  I have reviewed the triage vital signs and the nursing notes.   HISTORY  Chief Complaint Post-op Problem    HPI [place] Nguyen is a 65 y.o. male with extensive medical history and chronic pain history as listed below.  He presents tonight for persistent pain in the setting of a spine implant placed at [facility] about 3 days ago.  He said he has had constant pain since the procedure but he just could not stand it anymore tonight.  He also said that he has not had a good bowel movement since the procedure.  His regular prescription pain medicine does not seem to be working.  He is not having any vomiting.  He denies fever, chest pain, shortness of breath.  He has a difficult time describing the pain but says it is all throughout his back as well as feeling some heaviness in his abdomen.  The pain is severe and nothing particular makes it better or worse.     Past Medical History:  Diagnosis Date   Allergy    Arthritis    osteoarthritis   Cancer ([place])    melanoma skin cancer face   Chronic back pain    goes to Dr. Holley Raring at the pain clinic   Current every day smoker    Diverticulosis    GERD (gastroesophageal reflux disease)    H/O diverticulitis of colon    Restless leg syndrome    Sciatic pain     Patient Active Problem List   Diagnosis Date Noted   Aspiration pneumonia ([place]) 24/06/7352   Acute metabolic encephalopathy 29/92/4268   HLD (hyperlipidemia) 02/19/2021   Sepsis ([place]) 02/19/2021   Elevated CK 02/19/2021   Essential hypertension 11/28/2018   Benign prostatic hyperplasia with urinary frequency 04/04/2018   Lumbar radiculopathy 12/29/2017   Lumbar degenerative disc disease 12/29/2017   Lumbar foraminal stenosis 12/29/2017   History of left knee  replacement 12/29/2017   Primary osteoarthritis of both knees 12/29/2017   Chronic pain syndrome 12/29/2017   Primary osteoarthritis of left knee 10/14/2017   Chronic nasal congestion 11/24/2013   Benign paroxysmal positional vertigo 11/24/2013   Sleep disturbance 08/17/2013   Allergy 08/17/2013   GERD (gastroesophageal reflux disease) 08/17/2013   Tobacco abuse counseling 08/17/2013   Ringworm 08/17/2013    Past Surgical History:  Procedure Laterality Date   JOINT REPLACEMENT Left 10/14/2018   knee   skin cancer removal     face   TONSILLECTOMY AND ADENOIDECTOMY     TOTAL KNEE ARTHROPLASTY Left 10/14/2017   Procedure: TOTAL KNEE ARTHROPLASTY;  Surgeon: Hessie Knows, MD;  Location: [facility] ORS;  Service: Orthopedics;  Laterality: Left;    Prior to Admission medications   Medication Sig Start Date End Date Taking? Authorizing Provider  albuterol (VENTOLIN HFA) 108 (90 Base) MCG/ACT inhaler Inhale into the lungs. 11/16/14   [provider]  atorvastatin (LIPITOR) 40 MG tablet Take 1 tablet (40 mg total) by mouth daily. 03/01/21 03/31/21  Antonieta Pert, MD  Diclofenac Sodium 3 % GEL Place 1 application onto the skin 3 (three) times daily as needed (left knee pain). 08/09/19   Mikey College, NP  DULoxetine (CYMBALTA) 30 MG capsule Take by mouth. 03/19/20 03/19/21  [provider]  gabapentin (NEURONTIN) 600 MG tablet Take 600 mg by mouth 3 (three)  times daily.    [provider]  lisinopril (ZESTRIL) 5 MG tablet Take 2 tablets (10 mg total) by mouth daily. 02/22/21 03/24/21  Antonieta Pert, MD  nicotine (NICODERM CQ - DOSED IN MG/24 HOURS) 21 mg/24hr patch Place onto the skin. 03/29/20   [provider]  silodosin (RAPAFLO) 8 MG CAPS capsule SMARTSIG:1 Capsule(s) By Mouth Every Evening 01/13/20   [provider]  tiZANidine (ZANAFLEX) 4 MG tablet Take 1 tablet (4 mg total) by mouth every 8 (eight) hours as needed for muscle spasms. 01/14/21   [name],  MD    Allergies Shellfish allergy  Family History  Problem Relation Age of Onset   Hypertension Mother    Alzheimer's disease Father    Prostate cancer Father 64   Cancer Sister 36       breast cancer   Diabetes Sister    Heart disease Brother        Dealer pathways - ongoing workup   Healthy Brother    Healthy Son    Healthy Son    Healthy Son    Healthy Son     Social History Social History   Tobacco Use   Smoking status: Every Day    Packs/day: 2.00    Years: 30.00    Pack years: 60.00    Types: Cigarettes   Smokeless tobacco: Current  Vaping Use   Vaping Use: Never used  Substance Use Topics   Alcohol use: No   Drug use: No    Review of Systems Constitutional: No fever/chills Eyes: No visual changes. ENT: No sore throat. Cardiovascular: Denies chest pain. Respiratory: Denies shortness of breath. Gastrointestinal: Some abdominal pain.  No nausea, no vomiting.  No diarrhea.  +constipation. Genitourinary: Negative for dysuria. Musculoskeletal: Severe pain throughout his back. Integumentary: Negative for rash. Neurological: Negative for headaches, focal weakness or numbness.   ____________________________________________   PHYSICAL EXAM:  VITAL SIGNS: ED Triage Vitals  Enc Vitals Group     BP 04/12/21 2334 135/79     Pulse Rate 04/12/21 2334 (!) 111     Resp 04/12/21 2334 18     Temp 04/12/21 2334 99.4 F (37.4 C)     Temp src --      SpO2 04/12/21 2334 98 %     Weight 04/12/21 2332 104.3 kg (230 lb)     Height 04/12/21 2332 1.676 m (5\' 6" )     Head Circumference --      Peak Flow --      Pain Score 04/12/21 2332 10     Pain Loc --      Pain Edu? --      Excl. in [place]? --     Constitutional: Alert and oriented.  Appears uncomfortable but not in severe distress. Eyes: Conjunctivae are normal.  Head: Atraumatic. Nose: No congestion/rhinnorhea. Mouth/Throat: Patient is wearing a mask. Neck: No stridor.  No meningeal signs.    Cardiovascular: Normal rate, regular rhythm. Good peripheral circulation. Respiratory: Normal respiratory effort.  No retractions. Gastrointestinal: Soft and nondistended.  He reports mild diffuse tenderness throughout the abdomen but without any localized peritonitis or or even localized tenderness. Musculoskeletal: No gross deformities of his extremities.  Patient is ambulatory although slowly.  He has postoperative dressings in the midline of his back consistent with his recent surgical history.  I did not take down the surgical dressings but from what I can see they are well-appearing with only a small amount of dried blood underneath one  of the dressings in one place.  I palpated along the dressing and he has no significant tenderness to palpation.  Patient is ambulatory, slowly and with some hesitation but he is able to bear weight and walk around by himself.  In fact he frequently was getting up and walking around while he was in the waiting room. Neurologic:  Normal speech and language. No gross focal neurologic deficits are appreciated.  Skin:  Skin is warm, dry and intact.   ____________________________________________   LABS (all labs ordered are listed, but only abnormal results are displayed)  Labs Reviewed  CBC WITH DIFFERENTIAL/PLATELET - Abnormal; Notable for the following components:      Result Value   RBC 3.90 (*)    Hemoglobin 11.8 (*)    HCT 36.0 (*)    Monocytes Absolute 1.1 (*)    All other components within normal limits  BASIC METABOLIC PANEL - Abnormal; Notable for the following components:   Glucose, Bld 155 (*)    All other components within normal limits   ____________________________________________   INITIAL IMPRESSION / MDM / ASSESSMENT AND PLAN / ED COURSE  As part of my medical decision making, I reviewed the following data within the [place] notes reviewed and incorporated, Labs reviewed , Old chart reviewed, Notes from  prior ED visits, and [place] Controlled Substance Database   Differential diagnosis includes, but is not limited to, acute on chronic pain, surgical complication, constipation, acute intra-abdominal infection.  Patient's vital signs are stable other than some mild tachycardia which I think is due to his discomfort as well as his agitation.  His basic metabolic panel and CBC are reassuring with no leukocytosis.  His physical exam is reassuring as well without any specific focal issue and no indication that there is a complication of his back surgery.  He said that he did not go back to [facility] because he did not want to take the long bumpy road to [facility].  He said he has had constant pain since his surgery 3 days ago but then he also told me that he has been in pain since 2016.  He said that he often has problems with constipation and that it has been worse since the procedure.  He said that if he can just get something for pain that he will be fine and he wants to follow-up in [place].  I explained that in general we do not treat chronic pain in the emergency department but that given his level of discomfort I think it is reasonable this time to give him a dose of Dilaudid 1 mg intramuscular and Toradol 30 mg intramuscular.  Given no indication of an emergent medical condition tonight, I am comfortable discharging him and he said that that would be great, in fact he stated "if you will lean forward I will hug your neck."  I strongly encouraged him to stick with his regular pain medication and to try an aggressive bowel regimen to help with constipation and he assures me he will do so and follow-up in [facility].  I gave my usual and customary return precautions.           ____________________________________________  FINAL CLINICAL IMPRESSION(S) / ED DIAGNOSES  Final diagnoses:  Chronic back pain, unspecified back location, unspecified back pain laterality  Constipation, unspecified  constipation type     MEDICATIONS GIVEN DURING THIS VISIT:  Medications  HYDROmorphone (DILAUDID) injection 1 mg (has no administration in  time range)  ketorolac (TORADOL) 30 MG/ML injection 30 mg (has no administration in time range)  oxyCODONE-acetaminophen (PERCOCET/ROXICET) 5-325 MG per tablet 1 tablet (1 tablet Oral Given 04/12/21 2337)     ED Discharge Orders     None        Note:  This document was prepared using Dragon voice recognition software and may include unintentional dictation errors.   Hinda Kehr, MD 04/13/21 [phone]

## 2021-04-13 NOTE — Discharge Instructions (Addendum)
As we discussed, your evaluation today was generally reassuring.  We believe you are having pain associated with your recent procedure and with your severe chronic pain.  There is no indication you have a new emergent problem tonight although constipation from your recent surgery and the chronic opioids that you have to take.  We recommend that you try an aggressive bowel regimen including a 17 g dose of MiraLAX powder twice a day and at least 8 ounces of liquid (water, Gatorade, apple juice, prune juice, etc.).  You should also take Colace 100 mg (stool softener) once a day.  Continue taking your regular medications and follow-up with your doctors at the next available opportunity.    Return to the emergency department if you develop new or worsening symptoms that concern you.

## 2022-06-29 DIAGNOSIS — E119 Type 2 diabetes mellitus without complications: Secondary | ICD-10-CM | POA: Insufficient documentation

## 2022-06-29 DIAGNOSIS — N400 Enlarged prostate without lower urinary tract symptoms: Secondary | ICD-10-CM | POA: Diagnosis present

## 2023-01-31 ENCOUNTER — Observation Stay
Admission: EM | Admit: 2023-01-31 | Discharge: 2023-02-01 | Disposition: A | Discharge status: Home Health | Payer: Medicare HMO | Attending: Internal Medicine | Admitting: Internal Medicine

## 2023-01-31 ENCOUNTER — Other Ambulatory Visit: Payer: Self-pay

## 2023-01-31 ENCOUNTER — Emergency Department: Payer: Medicare HMO

## 2023-01-31 DIAGNOSIS — G929 Unspecified toxic encephalopathy: Secondary | ICD-10-CM | POA: Diagnosis not present

## 2023-01-31 DIAGNOSIS — F17210 Nicotine dependence, cigarettes, uncomplicated: Secondary | ICD-10-CM | POA: Diagnosis not present

## 2023-01-31 DIAGNOSIS — J4520 Mild intermittent asthma, uncomplicated: Secondary | ICD-10-CM | POA: Diagnosis not present

## 2023-01-31 DIAGNOSIS — S0990XA Unspecified injury of head, initial encounter: Secondary | ICD-10-CM | POA: Diagnosis present

## 2023-01-31 DIAGNOSIS — Z85828 Personal history of other malignant neoplasm of skin: Secondary | ICD-10-CM | POA: Diagnosis not present

## 2023-01-31 DIAGNOSIS — S0081XA Abrasion of other part of head, initial encounter: Secondary | ICD-10-CM | POA: Diagnosis not present

## 2023-01-31 DIAGNOSIS — W01198A Fall on same level from slipping, tripping and stumbling with subsequent striking against other object, initial encounter: Secondary | ICD-10-CM | POA: Diagnosis not present

## 2023-01-31 DIAGNOSIS — Z96652 Presence of left artificial knee joint: Secondary | ICD-10-CM | POA: Insufficient documentation

## 2023-01-31 DIAGNOSIS — N400 Enlarged prostate without lower urinary tract symptoms: Secondary | ICD-10-CM | POA: Diagnosis present

## 2023-01-31 DIAGNOSIS — R4182 Altered mental status, unspecified: Secondary | ICD-10-CM | POA: Insufficient documentation

## 2023-01-31 DIAGNOSIS — I10 Essential (primary) hypertension: Secondary | ICD-10-CM | POA: Diagnosis not present

## 2023-01-31 DIAGNOSIS — Z79899 Other long term (current) drug therapy: Secondary | ICD-10-CM | POA: Insufficient documentation

## 2023-01-31 DIAGNOSIS — W19XXXA Unspecified fall, initial encounter: Secondary | ICD-10-CM

## 2023-01-31 DIAGNOSIS — G894 Chronic pain syndrome: Secondary | ICD-10-CM | POA: Diagnosis present

## 2023-01-31 LAB — COMPREHENSIVE METABOLIC PANEL
ALT: 26 U/L (ref 0–44)
AST: 27 U/L (ref 15–41)
Albumin: 4.2 g/dL (ref 3.5–5.0)
Alkaline Phosphatase: 65 U/L (ref 38–126)
Anion gap: 10 (ref 5–15)
BUN: 13 mg/dL (ref 8–23)
CO2: 24 mmol/L (ref 22–32)
Calcium: 9.6 mg/dL (ref 8.9–10.3)
Chloride: 110 mmol/L (ref 98–111)
Creatinine, Ser: 1 mg/dL (ref 0.61–1.24)
GFR, Estimated: >60 mL/min (ref >60)
Glucose, Bld: 171 mg/dL — ABNORMAL HIGH (ref 70–99)
Potassium: 3.7 mmol/L (ref 3.5–5.1)
Sodium: 144 mmol/L (ref 135–145)
Total Bilirubin: 0.6 mg/dL (ref 0.3–1.2)
Total Protein: 7 g/dL (ref 6.5–8.1)

## 2023-01-31 LAB — CK: Total CK: 164 U/L (ref 49–397)

## 2023-01-31 LAB — TROPONIN I (HIGH SENSITIVITY): Troponin I (High Sensitivity): 10 ng/L (ref <18)

## 2023-01-31 LAB — ETHANOL: Alcohol, Ethyl (B): <10 mg/dL (ref <10)
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
## 2023-01-31 NOTE — ED Notes (Signed)
Patient going to CT

## 2023-01-31 NOTE — ED Provider Notes (Signed)
[facility] Provider Note    Event Date/Time   First MD Initiated Contact with Patient 01/31/23 2114     (approximate)   History   Fall   HPI  Anthony Nguyen is a 67 y.o. male with a history of hypertension, hyperlipidemia, GERD, depression, RLS, melanoma, BP, and chronic pain on oxycodone who presents with a head injury after a fall.  The patient states that he tripped and fell outside.  The patient was down for approximately 2 hours and then crawled into the house where his girlfriend called EMS.  The patient denies any alcohol use.  He states he takes oxycodone for pain.  He reports headache, pain to his neck and back, as well as pain to the ribs especially on the right.  He denies abdominal pain.  I reviewed the past medical records.  The patient was most recently admitted in May 2022 with altered mental status.  He was found to have sepsis and respiratory failure.  He has had no recent ED visits or admissions.   Physical Exam   Triage Vital Signs: ED Triage Vitals  Enc Vitals Group     BP 01/31/23 2119 (!) 149/106     Pulse Rate 01/31/23 2119 93     Resp 01/31/23 2119 (!) 24     Temp 01/31/23 2119 98.2 F (36.8 C)     Temp Source 01/31/23 2119 Oral     SpO2 01/31/23 2119 96 %     Weight 01/31/23 2123 244 lb 0.8 oz (110.7 kg)     Height 01/31/23 2123  (1.676 m)     Head Circumference --      Peak Flow --      Pain Score 01/31/23 2117 10     Pain Loc --      Pain Edu? --      Excl. in GC? --     Most recent vital signs: Vitals:   01/31/23 2119 01/31/23 2130  BP: (!) 149/106 138/73  Pulse: 93 99  Resp: (!) 24 20  Temp: 98.2 F (36.8 C)   SpO2: 96% 97%     General: Alert, oriented x 3 although mildly confused appearing. CV:  Good peripheral perfusion.  Resp:  Normal effort.  Abd:  Soft and nontender.  No distention.  Other:  EOMI.  PERRLA.  No facial droop.  Motor intact in all extremities.  Slurred speech.  Bilateral  anterior and right lateral chest wall tenderness with no step-off or crepitus.  Mild cervical, thoracic, and lumbar midline spinal tenderness with no step-off or crepitus.  2 cm skin contusion/abrasion to left upper forehead with no laceration.   ED Results / Procedures / Treatments   Labs (all labs ordered are listed, but only abnormal results are displayed) Labs Reviewed  COMPREHENSIVE METABOLIC PANEL - Abnormal; Notable for the following components:      Result Value   Glucose, Bld 171 (*)    All other components within normal limits  ETHANOL  CK  URINALYSIS, ROUTINE W REFLEX MICROSCOPIC  URINE DRUG SCREEN, QUALITATIVE ([facility] ONLY)  TROPONIN I (HIGH SENSITIVITY)  TROPONIN I (HIGH SENSITIVITY)     EKG  ED ECG REPORT I, Dionne Bucy, the attending physician, personally viewed and interpreted this ECG.  Date: 01/31/2023 EKG Time: 2232 Rate: 89 Rhythm: normal sinus rhythm QRS Axis: normal Intervals: normal ST/T Wave abnormalities: normal Narrative Interpretation: no evidence of acute ischemia    RADIOLOGY  CT head: I  independently viewed and interpreted the images; there is no ICH.  Radiology report indicates frontal hematoma with no evidence of skull fracture or intracranial hemorrhage.  CT cervical spine: No acute fracture CT thoracic spine: No acute fracture CT lumbar spine: No acute fracture  XR chest/ribs bilateral: No acute fracture  PROCEDURES:  Critical Care performed: No  Procedures   MEDICATIONS ORDERED IN ED: Medications - No data to display   IMPRESSION / MDM / ASSESSMENT AND PLAN / ED COURSE  I reviewed the triage vital signs and the nursing notes.  67 year old male with PMH as noted above presents with a head injury as well as pain to his neck, back, and ribs after a fall from standing height after which the patient was down on the ground for a few hours.  On exam the patient is alert, oriented but somewhat confused appearing.  He has  slurred speech and appears intoxicated although he denies any alcohol use in the last 8 years.  He is on oxycodone for pain.  Differential diagnosis includes, but is not limited to:  Fall/injuries: ICH, concussion, minor head injury, rib fracture versus contusion, spinal fractures versus contusion.  Mental status: Acute encephalopathy due to opiates or other medications, less likely drug or alcohol intoxication.  I have a low suspicion for electrolyte abnormality, other metabolic cause, UTI, or other infection.  We will obtain CT head, spine, x-rays of the ribs, and lab workup.  Patient's presentation is most consistent with acute presentation with potential threat to life or bodily function.  The patient is on the cardiac monitor to evaluate for evidence of arrhythmia and/or significant heart rate changes.  ----------------------------------------- 10:52 PM on 01/31/2023 -----------------------------------------  Imaging is negative for acute traumatic findings.  Lab workup so far is unremarkable although the ethanol level and urinalysis are still pending.  There are no significant electrolyte abnormalities and the troponin is negative.  The patient significant other is now here and states that the patient usually just goes to sleep after taking his pain medications.  She states that he does not abuse his medication, but sometimes may take an extra pill.  She states that he generally is not altered like this, but it has happened before.  She confirms that he has not been drinking alcohol in several years.  At this time I suspect that the primary etiology of the patient's mental status is his opiate use; we will await the additional lab workup and observe the patient until he is more alert.  If he remains altered for plan.  He may need admission for further observation although I expect that he will likely be able to go home.  ----------------------------------------- 11:43 PM on  01/31/2023 -----------------------------------------  Urinalysis is pending.  I have handed the patient off to the oncoming ED physician Dr. Elesa Massed.  FINAL CLINICAL IMPRESSION(S) / ED DIAGNOSES   Final diagnoses:  Injury of head, initial encounter  Abrasion of forehead, initial encounter  Altered mental status, unspecified altered mental status type     Rx / DC Orders   ED Discharge Orders     None        Note:  This document was prepared using Dragon voice recognition software and may include unintentional dictation errors.    Dionne Bucy, MD 01/31/23 [phone]

## 2023-01-31 NOTE — ED Triage Notes (Addendum)
Pt came in via [facility]. Pt fell outside pt states he tripped over something and told EMS he has LOC approximately two hours prior and crawled into house and girlfriend called EMS. Pt has hematoma to L side of head, scrapes all over arms and legs along with blood all over face.  Pt stated he stopped drinking in 2016 and hasn't drank since. Complaining of R rib pain, abdominal pain, neck pain and headache 10/10. Pupils equal, round and reactive. EMS vitals 160/100 BP, HR 89, 97% RA, CBG 177. EMS also gave patient 4mg  of Zofran but no vomiting occurred with them. Pt currently A&Ox4 to self, time and situation. According to significant other pt is not on blood thinners but is on BP meds.

## 2023-02-01 ENCOUNTER — Encounter: Payer: Self-pay | Admitting: Internal Medicine

## 2023-02-01 DIAGNOSIS — W19XXXA Unspecified fall, initial encounter: Secondary | ICD-10-CM

## 2023-02-01 DIAGNOSIS — Z79899 Other long term (current) drug therapy: Secondary | ICD-10-CM

## 2023-02-01 DIAGNOSIS — G929 Unspecified toxic encephalopathy: Secondary | ICD-10-CM

## 2023-02-01 DIAGNOSIS — Y92009 Unspecified place in unspecified non-institutional (private) residence as the place of occurrence of the external cause: Secondary | ICD-10-CM

## 2023-02-01 LAB — TROPONIN I (HIGH SENSITIVITY): Troponin I (High Sensitivity): 9 ng/L (ref <18)

## 2023-02-01 LAB — URINE DRUG SCREEN, QUALITATIVE (ARMC ONLY)
Amphetamines, Ur Screen: NOT DETECTED
Barbiturates, Ur Screen: NOT DETECTED
Benzodiazepine, Ur Scrn: POSITIVE — AB
Cannabinoid 50 Ng, Ur ~~LOC~~: NOT DETECTED
Cocaine Metabolite,Ur ~~LOC~~: NOT DETECTED
MDMA (Ecstasy)Ur Screen: NOT DETECTED
Methadone Scn, Ur: NOT DETECTED
Opiate, Ur Screen: NOT DETECTED
Phencyclidine (PCP) Ur S: NOT DETECTED
Tricyclic, Ur Screen: NOT DETECTED

## 2023-02-01 LAB — URINALYSIS, ROUTINE W REFLEX MICROSCOPIC
Bacteria, UA: NONE SEEN
Bilirubin Urine: NEGATIVE
Glucose, UA: NEGATIVE mg/dL
Hgb urine dipstick: NEGATIVE
Ketones, ur: NEGATIVE mg/dL
Leukocytes,Ua: NEGATIVE
Nitrite: NEGATIVE
Protein, ur: 100 mg/dL — AB
Specific Gravity, Urine: 1.024 (ref 1.005–1.030)
Squamous Epithelial / HPF: NONE SEEN /HPF (ref 0–5)
pH: 6 (ref 5.0–8.0)

## 2023-02-01 LAB — CBC
HCT: 40 % (ref 39.0–52.0)
Hemoglobin: 13.3 g/dL (ref 13.0–17.0)
MCH: 30.4 pg (ref 26.0–34.0)
MCHC: 33.3 g/dL (ref 30.0–36.0)
MCV: 91.3 fL (ref 80.0–100.0)
Platelets: 145 10*3/uL — ABNORMAL LOW (ref 150–400)
RBC: 4.38 MIL/uL (ref 4.22–5.81)
RDW: 12.8 % (ref 11.5–15.5)
WBC: 12.2 10*3/uL — ABNORMAL HIGH (ref 4.0–10.5)
nRBC: 0 % (ref 0.0–0.2)

## 2023-02-01 LAB — CREATININE, SERUM
Creatinine, Ser: 1.08 mg/dL (ref 0.61–1.24)
GFR, Estimated: >60 mL/min (ref >60)

## 2023-02-01 LAB — HIV ANTIBODY (ROUTINE TESTING W REFLEX): HIV Screen 4th Generation wRfx: NONREACTIVE

## 2023-02-01 MED ORDER — GABAPENTIN 600 MG PO TABS: 600.0000 mg | ORAL_TABLET | Freq: Three times a day (TID) | ORAL | 0 refills | Status: AC

## 2023-02-01 MED ORDER — ENOXAPARIN SODIUM 60 MG/0.6ML IJ SOSY
0.5000 mg/kg | PREFILLED_SYRINGE | INTRAMUSCULAR | Status: DC
  Administered 2023-02-01: 55 mg via SUBCUTANEOUS
  Filled 2023-02-01: qty 0.6

## 2023-02-01 MED ORDER — LISINOPRIL 5 MG PO TABS: 10.0000 mg | ORAL_TABLET | Freq: Every day | ORAL | 0 refills | Status: AC

## 2023-02-01 MED ORDER — OXYCODONE HCL 5 MG PO TABS
5.0000 mg | ORAL_TABLET | ORAL | Status: DC | PRN
  Administered 2023-02-01 (×3): 5 mg via ORAL
  Filled 2023-02-01 (×3): qty 1

## 2023-02-01 MED ORDER — SODIUM CHLORIDE 0.9% FLUSH
3.0000 mL | Freq: Two times a day (BID) | INTRAVENOUS | Status: DC
  Administered 2023-02-01 (×2): 3 mL via INTRAVENOUS

## 2023-02-01 MED ORDER — ACETAMINOPHEN 325 MG PO TABS
650.0000 mg | ORAL_TABLET | Freq: Four times a day (QID) | ORAL | Status: DC | PRN
  Administered 2023-02-01: 650 mg via ORAL
  Filled 2023-02-01: qty 2

## 2023-02-01 MED ORDER — KETOROLAC TROMETHAMINE 30 MG/ML IJ SOLN
30.0000 mg | Freq: Once | INTRAMUSCULAR | Status: AC
  Administered 2023-02-01: 30 mg via INTRAVENOUS
  Filled 2023-02-01: qty 1

## 2023-02-01 MED ORDER — GABAPENTIN 300 MG PO CAPS
600.0000 mg | ORAL_CAPSULE | Freq: Three times a day (TID) | ORAL | Status: DC
  Administered 2023-02-01: 600 mg via ORAL
  Filled 2023-02-01: qty 2

## 2023-02-01 MED ORDER — ALBUTEROL SULFATE (2.5 MG/3ML) 0.083% IN NEBU: 2.5000 mg | INHALATION_SOLUTION | RESPIRATORY_TRACT | Status: DC | PRN

## 2023-02-01 MED ORDER — ONDANSETRON HCL 4 MG PO TABS: 4.0000 mg | ORAL_TABLET | Freq: Four times a day (QID) | ORAL | Status: DC | PRN

## 2023-02-01 MED ORDER — ACETAMINOPHEN 325 MG RE SUPP: 650.0000 mg | Freq: Four times a day (QID) | RECTAL | Status: DC | PRN

## 2023-02-01 MED ORDER — ONDANSETRON HCL 4 MG/2ML IJ SOLN: 4.0000 mg | Freq: Four times a day (QID) | INTRAMUSCULAR | Status: DC | PRN

## 2023-02-01 MED ORDER — TIZANIDINE HCL 2 MG PO TABS: 4.0000 mg | ORAL_TABLET | Freq: Three times a day (TID) | ORAL | Status: DC | PRN

## 2023-02-01 MED ORDER — LISINOPRIL 10 MG PO TABS
10.0000 mg | ORAL_TABLET | Freq: Every day | ORAL | Status: DC
  Administered 2023-02-01: 10 mg via ORAL
  Filled 2023-02-01: qty 1

## 2023-02-01 MED ORDER — DULOXETINE HCL 30 MG PO CPEP
30.0000 mg | ORAL_CAPSULE | Freq: Every day | ORAL | Status: DC
  Administered 2023-02-01: 30 mg via ORAL
  Filled 2023-02-01: qty 1

## 2023-02-01 MED ORDER — ATORVASTATIN CALCIUM 20 MG PO TABS
40.0000 mg | ORAL_TABLET | Freq: Every day | ORAL | Status: DC
  Administered 2023-02-01: 40 mg via ORAL
  Filled 2023-02-01: qty 2

## 2023-02-01 MED ORDER — NICOTINE 21 MG/24HR TD PT24
21.0000 mg | MEDICATED_PATCH | Freq: Every day | TRANSDERMAL | Status: DC
  Administered 2023-02-01: 21 mg via TRANSDERMAL
  Filled 2023-02-01: qty 1

## 2023-02-01 NOTE — ED Notes (Signed)
Pharmacy tech at bedside 

## 2023-02-01 NOTE — Assessment & Plan Note (Signed)
Continue lisinopril

## 2023-02-01 NOTE — ED Notes (Signed)
Pt awake at this time, vitals obtained and stable at this time. Pt is alert and able to answer this RN's questions without difficulty. Pt placed in gown, pt denies any other needs at this time. Call bell in reach. Call bell in reach.

## 2023-02-01 NOTE — Progress Notes (Signed)
Anticoagulation monitoring(Lovenox):  67 yo male ordered Lovenox 40 mg Q24h    Filed Weights   01/31/23 2123  Weight: 110.7 kg (244 lb 0.8 oz)   BMI 39.4     Lab Results  Component Value Date   CREATININE 1.00 01/31/2023   CREATININE 0.84 04/12/2021   CREATININE 0.80 02/22/2021   Estimated Creatinine Clearance: 87.2 mL/min (by C-G formula based on SCr of 1 mg/dL). Hemoglobin & Hematocrit     Component Value Date/Time   HGB 11.8 (L) 04/12/2021 2335   HGB 14.7 11/07/2014 2113   HCT 36.0 (L) 04/12/2021 2335   HCT 44.6 11/07/2014 2113     Per Protocol for Patient with estCrcl > 30 ml/min and BMI > 30, will transition to Lovenox 55 mg Q24h.

## 2023-02-01 NOTE — Discharge Summary (Signed)
Physician Discharge Summary   Patient: Anthony Nguyen MRN: [id number] DOB: [birth year]  Admit date:     01/31/2023  Discharge date: 02/01/23  Discharge Physician: Loyce Dys   PCP: Eddie Dibbles, MD     Discharge Diagnoses: Acute toxic encephalopathy - prior history of medication related toxic encephalopathy with complication in 2022 Fall at home, initial encounter Polypharmacy Chronic pain syndrome Asthma, mild intermittent Benign prostatic hyperplasia Essential hypertension    Hospital Course: Anthony Nguyen is a 67 y.o. male with medical history significant for Chronic pain on oxycodone, depression, BPH, hospitalized in 2022 with medication related encephalopathy leading to a fall, aspiration pneumonia and respiratory failure, and rhabdo who presents to the ED following a fall.  Patient reportedly fell while trying to get onto his mower and was down for 2 hours until he was able to crawl inside to the house where his girlfriend called EMS.  Patient is described as appearing intoxicated and with slurred speech on his arrival to the ED.  Family voiced concern to the ED provider that he might have taken to too much of his oxycodone and according to ED provider "His girlfriend has told us privately that she thinks that he is stealing her Xanax".  He complains of headache and pain in his back neck and right chest. ED course and data review: BP 149/106 on arrival with mild tachypnea of 24 but with otherwise normal vitals.  UDS positive for benzos, negative for opiates.  CK normal at 164.  Labs otherwise unremarkable. Trauma imaging negative and included CT head C-spine thoracolumbar spine and x-ray bilateral ribs and chest x-ray.  During hospitalization his mental status improved to baseline and currently patient is alert and oriented x 3.  He was seen by occupational therapy as well as physical therapist and was able to participate in therapy with no issues and has been cleared for  discharge home with home occupational therapy.  Consultants: None Procedures performed: None Disposition: Home health Diet recommendation:  Discharge Diet Orders (From admission, onward)     Start     Ordered   02/01/23 0000  Diet - low sodium heart healthy        02/01/23 1452   02/01/23 0000  Diet - low sodium heart healthy        02/01/23 1453           Cardiac diet DISCHARGE MEDICATION: Allergies as of 02/01/2023       Reactions   Shellfish Allergy Swelling   Oysters and fish last time he consumed caused swelling in the bottom legs         Medication List     STOP taking these medications    atorvastatin 40 MG tablet Commonly known as: LIPITOR   Diclofenac Sodium 3 % Gel   DULoxetine 30 MG capsule Commonly known as: CYMBALTA   nicotine 21 mg/24hr patch Commonly known as: NICODERM CQ - dosed in mg/24 hours   silodosin 8 MG Caps capsule Commonly known as: RAPAFLO   tamsulosin 0.4 MG Caps capsule Commonly known as: FLOMAX   tiZANidine 4 MG tablet Commonly known as: ZANAFLEX       TAKE these medications    albuterol 108 (90 Base) MCG/ACT inhaler Commonly known as: VENTOLIN HFA Inhale 1-2 puffs into the lungs every 4 (four) hours as needed for wheezing or shortness of breath.   amLODipine 10 MG tablet Commonly known as: NORVASC Take 10 mg by mouth daily.   budesonide-formoterol  160-4.5 MCG/ACT inhaler Commonly known as: SYMBICORT Inhale 2 puffs into the lungs 2 (two) times daily.   celecoxib 100 MG capsule Commonly known as: CELEBREX Take 100 mg by mouth 2 (two) times daily.   gabapentin 600 MG tablet Commonly known as: NEURONTIN Take 1 tablet (600 mg total) by mouth 3 (three) times daily. What changed: Another medication with the same name was removed. Continue taking this medication, and follow the directions you see here.   lisinopril 5 MG tablet Commonly known as: ZESTRIL Take 2 tablets (10 mg total) by mouth daily. What changed:  Another medication with the same name was removed. Continue taking this medication, and follow the directions you see here.   metFORMIN 500 MG 24 hr tablet Commonly known as: GLUCOPHAGE-XR Take 1,000 mg by mouth 2 (two) times daily with a meal.   naloxone 4 MG/0.1ML Liqd nasal spray kit Commonly known as: NARCAN Place 1 spray into the nose once.  One spray in either nostril once for known/suspected opioid overdose. May repeat every 2-3 minutes in alternating nostril til EMS arrives   Oxycodone HCl 10 MG Tabs Take 10 mg by mouth 4 (four) times daily as needed. Start taking on: February 04, 2023        Discharge Exam: Ceasar Mons Weights   01/31/23 2123  Weight: 110.7 kg   Vitals and nursing note reviewed.  Constitutional:      General: He is not in acute distress. HENT:     Head: Abrasion on the forehead Cardiovascular:     Rate and Rhythm: Normal rate and regular rhythm.     Heart sounds: Normal heart sounds.  Pulmonary:     Effort: Pulmonary effort is normal.     Breath sounds: Normal breath sounds.  Abdominal:     Palpations: Abdomen is soft.     Tenderness: There is no abdominal tenderness.  Musculoskeletal:     Comments: Abrasions right arm  Neurological: Alert and and oriented x 3    Condition at discharge: good Discharge time spent: greater than 30 minutes.  Signed: Loyce Dys, MD [facility] 02/01/2023

## 2023-02-01 NOTE — H&P (Addendum)
History and Physical    Patient: Anthony Nguyen UJW:[id number] DOB: 1956/06/01 DOA: 01/31/2023 DOS: the patient was seen and examined on 02/01/2023 PCP: Eddie Dibbles, MD  Patient coming from: Home  Chief Complaint:  Chief Complaint  Patient presents with   Fall    HPI: Anthony Nguyen is a 67 y.o. male with medical history significant for Chronic pain on oxycodone, depression, BPH, hospitalized in 2022 with medication related encephalopathy leading to a fall, aspiration pneumonia and respiratory failure, and rhabdo who presents to the ED following a fall.  Patient reportedly fell while trying to get onto his mower and was down for 2 hours until he was able to crawl inside to the house where his girlfriend called EMS.  Patient is described as appearing intoxicated and with slurred speech on his arrival to the ED.  Family voiced concern to the ED provider that he might have taken to too much of his oxycodone and according to ED provider "His girlfriend has told us privately that she thinks that he is stealing her Xanax".  He complains of headache and pain in his back neck and right chest. ED course and data review: BP 149/106 on arrival with mild tachypnea of 24 but with otherwise normal vitals.  UDS positive for benzos, negative for opiates.  CK normal at 164.  Labs otherwise unremarkable. Trauma imaging negative and included CT head C-spine thoracolumbar spine and x-ray bilateral ribs and chest x-ray. Patient was observed for several hours in the emergency room but continued to appear very drowsy and unsteady and was encouraged to stay for observation to which he agreed. Hospitalist consulted for admission.     Past Medical History:  Diagnosis Date   Allergy    Arthritis    osteoarthritis   Cancer (HCC)    melanoma skin cancer face   Chronic back pain    goes to Dr. Cherylann Ratel at the pain clinic   Current every day smoker    Diverticulosis    GERD (gastroesophageal reflux  disease)    H/O diverticulitis of colon    Restless leg syndrome    Sciatic pain    Past Surgical History:  Procedure Laterality Date   JOINT REPLACEMENT Left 10/14/2018   knee   skin cancer removal     face   TONSILLECTOMY AND ADENOIDECTOMY     TOTAL KNEE ARTHROPLASTY Left 10/14/2017   Procedure: TOTAL KNEE ARTHROPLASTY;  Surgeon: Kennedy Bucker, MD;  Location: [facility] ORS;  Service: Orthopedics;  Laterality: Left;   Social History:  reports that he has been smoking cigarettes. He has a 60.00 pack-year smoking history. He uses smokeless tobacco. He reports that he does not drink alcohol and does not use drugs.  Allergies  Allergen Reactions   Shellfish Allergy Swelling    Oysters and fish last time he consumed caused swelling in the bottom legs     Family History  Problem Relation Age of Onset   Hypertension Mother    Alzheimer's disease Father    Prostate cancer Father 46   Cancer Sister 14       breast cancer   Diabetes Sister    Heart disease Brother        Lobbyist pathways - ongoing workup   Healthy Brother    Healthy Son    Healthy Son    Healthy Son    Healthy Son     Prior to Admission medications   Medication Sig Start Date End Date Taking? Authorizing  Provider  albuterol (VENTOLIN HFA) 108 (90 Base) MCG/ACT inhaler Inhale into the lungs. 11/16/14   [provider]  atorvastatin (LIPITOR) 40 MG tablet Take 1 tablet (40 mg total) by mouth daily. 03/01/21 03/31/21  Lanae Boast, MD  Diclofenac Sodium 3 % GEL Place 1 application onto the skin 3 (three) times daily as needed (left knee pain). 08/09/19   Galen Manila, NP  DULoxetine (CYMBALTA) 30 MG capsule Take by mouth. 03/19/20 03/19/21  [provider]  gabapentin (NEURONTIN) 600 MG tablet Take 600 mg by mouth 3 (three) times daily.    [provider]  lisinopril (ZESTRIL) 5 MG tablet Take 2 tablets (10 mg total) by mouth daily. 02/22/21 03/24/21  Lanae Boast, MD  nicotine (NICODERM CQ -  DOSED IN MG/24 HOURS) 21 mg/24hr patch Place onto the skin. 03/29/20   [provider]  silodosin (RAPAFLO) 8 MG CAPS capsule SMARTSIG:1 Capsule(s) By Mouth Every Evening 01/13/20   [provider]  tiZANidine (ZANAFLEX) 4 MG tablet Take 1 tablet (4 mg total) by mouth every 8 (eight) hours as needed for muscle spasms. 01/14/21   Edward Jolly, MD    Physical Exam: Vitals:   02/01/23 0100 02/01/23 0130 02/01/23 0200 02/01/23 0230  BP: (!) 152/87 (!) 164/93  (!) 140/81  Pulse: 92 90 85 81  Resp:  (!) Temp:  97.7 F (36.5 C)    TempSrc:  Oral    SpO2: 97% 98% 99% 95%  Weight:      Height:       Physical Exam Vitals and nursing note reviewed.  Constitutional:      General: He is not in acute distress.    Comments: Patient awake and conversing but speech is slurred   HENT:     Head: Normocephalic. Abrasion and contusion present.     Comments: Contusion and abrasion forehead Cardiovascular:     Rate and Rhythm: Normal rate and regular rhythm.     Heart sounds: Normal heart sounds.  Pulmonary:     Effort: Pulmonary effort is normal.     Breath sounds: Normal breath sounds.  Abdominal:     Palpations: Abdomen is soft.     Tenderness: There is no abdominal tenderness.  Musculoskeletal:     Comments: Abrasions right arm  Neurological:     General: No focal deficit present.     Mental Status: Mental status is at baseline.     Comments: Unsteady while turning in bed     Labs on Admission: I have personally reviewed following labs and imaging studies  CBC: No results for input(s): "WBC", "NEUTROABS", "HGB", "HCT", "MCV", "PLT" in the last 168 hours. Basic Metabolic Panel: Recent Labs  Lab 01/31/23 2142  NA 144  K 3.7  CL 110  CO2 24  GLUCOSE 171*  BUN 13  CREATININE 1.00  CALCIUM 9.6   GFR: Estimated Creatinine Clearance: 87.2 mL/min (by C-G formula based on SCr of 1 mg/dL). Liver Function Tests: Recent Labs  Lab 01/31/23 2142  AST 27   ALT 26  ALKPHOS 65  BILITOT 0.6  PROT 7.0  ALBUMIN 4.2   No results for input(s): "LIPASE", "AMYLASE" in the last 168 hours. No results for input(s): "AMMONIA" in the last 168 hours. Coagulation Profile: No results for input(s): "INR", "PROTIME" in the last 168 hours. Cardiac Enzymes: Recent Labs  Lab 01/31/23 2142  CKTOTAL 164   BNP (last 3 results) No results for input(s): "PROBNP" in the  last 8760 hours. HbA1C: No results for input(s): "HGBA1C" in the last 72 hours. CBG: No results for input(s): "GLUCAP" in the last 168 hours. Lipid Profile: No results for input(s): "CHOL", "HDL", "LDLCALC", "TRIG", "CHOLHDL", "LDLDIRECT" in the last 72 hours. Thyroid Function Tests: No results for input(s): "TSH", "T4TOTAL", "FREET4", "T3FREE", "THYROIDAB" in the last 72 hours. Anemia Panel: No results for input(s): "VITAMINB12", "FOLATE", "FERRITIN", "TIBC", "IRON", "RETICCTPCT" in the last 72 hours. Urine analysis:    Component Value Date/Time   COLORURINE YELLOW (A) 02/01/2023 0102   APPEARANCEUR CLEAR (A) 02/01/2023 0102   APPEARANCEUR Clear 03/14/2014 1216   LABSPEC 1.024 02/01/2023 0102   LABSPEC 1.018 03/14/2014 1216   PHURINE 6.0 02/01/2023 0102   GLUCOSEU NEGATIVE 02/01/2023 0102   GLUCOSEU Negative 03/14/2014 1216   HGBUR NEGATIVE 02/01/2023 0102   BILIRUBINUR NEGATIVE 02/01/2023 0102   BILIRUBINUR Negative 03/14/2014 1216   KETONESUR NEGATIVE 02/01/2023 0102   PROTEINUR 100 (A) 02/01/2023 0102   UROBILINOGEN 0.2 03/17/2014 2208   NITRITE NEGATIVE 02/01/2023 0102   LEUKOCYTESUR NEGATIVE 02/01/2023 0102   LEUKOCYTESUR Negative 03/14/2014 1216    Radiological Exams on Admission: DG Ribs Bilateral W/Chest  Result Date: 01/31/2023 CLINICAL DATA:  Fall today, unable to ambulate.  Right rib pain. EXAM: BILATERAL RIBS AND CHEST - 4+ VIEW COMPARISON:  Radiographs 02/19/2021 FINDINGS: No definite displaced rib fractures. Stable cardiomediastinal silhouette. Aortic  atherosclerotic calcification. No focal consolidation, pleural effusion, or pneumothorax. No displaced rib fractures. Neurostimulator leads project over the T9-T10 interspace. IMPRESSION: No definite displaced rib fractures. Electronically Signed   By: Minerva Fester M.D.   On: 01/31/2023 22:25   CT Thoracic Spine Wo Contrast  Result Date: 01/31/2023 CLINICAL DATA:  Trauma/fall EXAM: CT THORACIC SPINE WITHOUT CONTRAST TECHNIQUE: Multidetector CT images of the thoracic were obtained using the standard protocol without intravenous contrast. RADIATION DOSE REDUCTION: This exam was performed according to the departmental dose-optimization program which includes automated exposure control, adjustment of the mA and/or kV according to patient size and/or use of iterative reconstruction technique. COMPARISON:  None Available. FINDINGS: Alignment: Normal thoracic kyphosis. Vertebrae: No acute fracture or focal pathologic process. Paraspinal and other soft tissues: Atherosclerotic calcifications of the aortic arch. Moderate coronary atherosclerosis of the LAD and left circumflex. Mild dependent atelectasis in the bilateral lower lobes. Thoracic spine stimulator. Disc levels: Mild degenerative changes of the mid/lower thoracic spine. IMPRESSION: No traumatic injury to the thoracic spine. Electronically Signed   By: Charline Bills M.D.   On: 01/31/2023 22:21   CT Lumbar Spine Wo Contrast  Result Date: 01/31/2023 CLINICAL DATA:  Trauma/fall EXAM: CT LUMBAR SPINE WITHOUT CONTRAST TECHNIQUE: Multidetector CT imaging of the lumbar spine was performed without intravenous contrast administration. Multiplanar CT image reconstructions were also generated. RADIATION DOSE REDUCTION: This exam was performed according to the departmental dose-optimization program which includes automated exposure control, adjustment of the mA and/or kV according to patient size and/or use of iterative reconstruction technique. COMPARISON:  CT  abdomen/pelvis dated 03/22/2019 FINDINGS: Segmentation: 5 lumbar type vertebral bodies. Alignment: Normal lumbar lordosis. Vertebrae: No acute fracture or focal pathologic process. Paraspinal and other soft tissues: Atherosclerotic calcifications abdominal aorta and branch vessels. Disc levels: Mild degenerative changes, most prominent at L5-S1. Spinal canal is patent. IMPRESSION: No traumatic injury to the lumbar spine. Electronically Signed   By: Charline Bills M.D.   On: 01/31/2023 22:20   CT Head Wo Contrast  Result Date: 01/31/2023 CLINICAL DATA:  Trauma/fall EXAM: CT HEAD WITHOUT CONTRAST CT CERVICAL  SPINE WITHOUT CONTRAST TECHNIQUE: Multidetector CT imaging of the head and cervical spine was performed following the standard protocol without intravenous contrast. Multiplanar CT image reconstructions of the cervical spine were also generated. RADIATION DOSE REDUCTION: This exam was performed according to the departmental dose-optimization program which includes automated exposure control, adjustment of the mA and/or kV according to patient size and/or use of iterative reconstruction technique. COMPARISON:  CT head dated 02/19/2021. CT cervical spine dated 04/18/2019. FINDINGS: CT HEAD FINDINGS Brain: No evidence of acute infarction, hemorrhage, hydrocephalus, extra-axial collection or mass lesion/mass effect. Vascular: No hyperdense vessel or unexpected calcification. Skull: Normal. Negative for fracture or focal lesion. Sinuses/Orbits: The visualized paranasal sinuses are essentially clear. The mastoid air cells are unopacified. Other: Moderate extracranial hematoma overlying the left frontal bone (series 2/image 24). CT CERVICAL SPINE FINDINGS Alignment: Normal cervical lordosis. Skull base and vertebrae: No acute fracture. No primary bone lesion or focal pathologic process. Soft tissues and spinal canal: No prevertebral fluid or swelling. No visible canal hematoma. Disc levels: Very mild degenerative  changes at C6-7. Spinal canal is patent. Upper chest: Visualized lung apices are clear. Other: Visualized thyroid is unremarkable. IMPRESSION: Moderate extracranial hematoma overlying the left frontal bone. No evidence of calvarial fracture. No acute intracranial abnormality. No traumatic injury to the cervical spine. Electronically Signed   By: Charline Bills M.D.   On: 01/31/2023 22:19   CT Cervical Spine Wo Contrast  Result Date: 01/31/2023 CLINICAL DATA:  Trauma/fall EXAM: CT HEAD WITHOUT CONTRAST CT CERVICAL SPINE WITHOUT CONTRAST TECHNIQUE: Multidetector CT imaging of the head and cervical spine was performed following the standard protocol without intravenous contrast. Multiplanar CT image reconstructions of the cervical spine were also generated. RADIATION DOSE REDUCTION: This exam was performed according to the departmental dose-optimization program which includes automated exposure control, adjustment of the mA and/or kV according to patient size and/or use of iterative reconstruction technique. COMPARISON:  CT head dated 02/19/2021. CT cervical spine dated 04/18/2019. FINDINGS: CT HEAD FINDINGS Brain: No evidence of acute infarction, hemorrhage, hydrocephalus, extra-axial collection or mass lesion/mass effect. Vascular: No hyperdense vessel or unexpected calcification. Skull: Normal. Negative for fracture or focal lesion. Sinuses/Orbits: The visualized paranasal sinuses are essentially clear. The mastoid air cells are unopacified. Other: Moderate extracranial hematoma overlying the left frontal bone (series 2/image 24). CT CERVICAL SPINE FINDINGS Alignment: Normal cervical lordosis. Skull base and vertebrae: No acute fracture. No primary bone lesion or focal pathologic process. Soft tissues and spinal canal: No prevertebral fluid or swelling. No visible canal hematoma. Disc levels: Very mild degenerative changes at C6-7. Spinal canal is patent. Upper chest: Visualized lung apices are clear. Other:  Visualized thyroid is unremarkable. IMPRESSION: Moderate extracranial hematoma overlying the left frontal bone. No evidence of calvarial fracture. No acute intracranial abnormality. No traumatic injury to the cervical spine. Electronically Signed   By: Charline Bills M.D.   On: 01/31/2023 22:19     Data Reviewed: Relevant notes from primary care and specialist visits, past discharge summaries as available in EHR, including Care Everywhere. Prior diagnostic testing as pertinent to current admission diagnoses Updated medications and problem lists for reconciliation ED course, including vitals, labs, imaging, treatment and response to treatment Triage notes, nursing and pharmacy notes and ED provider's notes Notable results as noted in HPI   Assessment and Plan: * Toxic encephalopathy - prior history of medication related toxic encephalopathy with complication in 2022 Polypharmacy Patient with somnolence and drowsiness, UDS positive for Xanax Similar presentation in 2022 when  patient had the additional complication of aspiration pneumonia and rhabdo related to fall Neurologic checks with fall and aspiration precautions  Fall at home, initial encounter Polypharmacy CK1 64. Fall precautions For counseling on responsible use of prescribed medications  Chronic pain syndrome Continue multimodal pain relief with Cymbalta, gabapentin and Zanaflex, oxycodone  Asthma, mild intermittent Albuterol as needed  Benign prostatic hyperplasia Continue Rapaflo  Essential hypertension Continue lisinopril        DVT prophylaxis: Lovenox  Consults: none  Advance Care Planning:   Code Status: Prior   Family Communication: none  Disposition Plan: Back to previous home environment  Severity of Illness: The appropriate patient status for this patient is OBSERVATION. Observation status is judged to be reasonable and necessary in order to provide the required intensity of service to ensure  the patient's safety. The patient's presenting symptoms, physical exam findings, and initial radiographic and laboratory data in the context of their medical condition is felt to place them at decreased risk for further clinical deterioration. Furthermore, it is anticipated that the patient will be medically stable for discharge from the hospital within 2 midnights of admission.   Author: Andris Baumann, MD 02/01/2023 2:57 AM  For on call review [email or web address].

## 2023-02-01 NOTE — Assessment & Plan Note (Signed)
Albuterol as needed 

## 2023-02-01 NOTE — Evaluation (Signed)
Occupational Therapy Evaluation Patient Details Name: Anthony Nguyen MRN: [id number] DOB: 07/29/1956 Today's Date: 02/01/2023   History of Present Illness 67 y.o. male with medical history significant for Chronic pain on oxycodone, depression, BPH, hospitalized in 2022 with medication related encephalopathy leading to a fall, aspiration pneumonia and respiratory failure, and rhabdo who presents to the ED following a fall.  Patient reportedly fell while trying to get onto his mower and was down for 2 hours until he was able to crawl inside to the house where his girlfriend called EMS.  Patient is described as appearing intoxicated and with slurred speech on his arrival to the ED.   Clinical Impression   Patient presenting with decreased Ind in self care, balance,functional mobility/transfers, endurance, and safety awareness. Patient reports living at home with significant other and brother checking in daily. Pt endorses being mod I at baseline with use of SPC for mobility and being Ind in self care.  Patient currently functioning at supervision - min guard without use of AD and being impulsive with movement/transitions requiring min cuing for safety awareness. Pt demonstrated toileting needs and LB clothing management with min guard and ambulates 150' in hallways with min guard progressing to supervision. Still some confusion but pt appears to be clearing some.  Patient will benefit from acute OT to increase overall independence in the areas of ADLs, functional mobility, and safety awareness in order to safely discharge.     Recommendations for follow up therapy are one component of a multi-disciplinary discharge planning process, led by the attending physician.  Recommendations may be updated based on patient status, additional functional criteria and insurance authorization.   Assistance Recommended at Discharge Intermittent Supervision/Assistance  Patient can return home with the following A  little help with walking and/or transfers;A little help with bathing/dressing/bathroom;Assistance with cooking/housework;Assist for transportation;Help with stairs or ramp for entrance;Direct supervision/assist for financial management;Direct supervision/assist for medications management    Functional Status Assessment  Patient has had a recent decline in their functional status and demonstrates the ability to make significant improvements in function in a reasonable and predictable amount of time.  Equipment Recommendations  None recommended by OT       Precautions / Restrictions Precautions Precautions: Fall      Mobility Bed Mobility Overal bed mobility: Modified Independent             General bed mobility comments: no physical assistance provided    Transfers Overall transfer level: Needs assistance Equipment used: 1 person hand held assist Transfers: Sit to/from Stand Sit to Stand: Supervision, Min guard                  Balance Overall balance assessment: Needs assistance Sitting-balance support: Feet supported Sitting balance-Leahy Scale: Good     Standing balance support: During functional activity, Single extremity supported Standing balance-Leahy Scale: Fair                             ADL either performed or assessed with clinical judgement   ADL Overall ADL's : Needs assistance/impaired     Grooming: Wash/dry hands;Standing;Supervision/safety                   Toilet Transfer: Solicitor;Ambulation   Toileting- Clothing Manipulation and Hygiene: Min guard;Sit to/from stand       Functional mobility during ADLs: Min guard       Vision Baseline Vision/History: 1 Wears glasses  Patient Visual Report: No change from baseline              Pertinent Vitals/Pain Pain Assessment Pain Assessment: No/denies pain     Hand Dominance Right   Extremity/Trunk Assessment Upper Extremity Assessment Upper  Extremity Assessment: Overall WFL for tasks assessed;Generalized weakness   Lower Extremity Assessment Lower Extremity Assessment: Overall WFL for tasks assessed;Generalized weakness       Communication Communication Communication: Other (comment);No difficulties (some difficulty at first but OT noticed pt's dentures are loose and moving around in mouth when he talks)   Cognition Arousal/Alertness: Awake/alert Behavior During Therapy: Impulsive Overall Cognitive Status: No family/caregiver present to determine baseline cognitive functioning                                 General Comments: Pt is oriented to self, location, situation, year, and day of the week. He is confused in regards of month. Min cuing for safety awareness during session.                Home Living Family/patient expects to be discharged to:: Private residence Living Arrangements: Spouse/significant other Available Help at Discharge: Available 24 hours/day;Other (Comment) (significant other) Type of Home: House Home Access: Stairs to enter [employer] of Steps: 3 Entrance Stairs-Rails: Right;Left;Can reach both Home Layout: One level     Bathroom Shower/Tub: Producer, television/film/video: Standard     Home Equipment: Agricultural consultant (2 wheels);Cane - single point          Prior Functioning/Environment Prior Level of Function : Independent/Modified Independent;Driving               ADLs Comments: Pt uses SPC for mobility and performs and IADLs and ADLs Ind        OT Problem List: Decreased cognition;Decreased activity tolerance;Decreased safety awareness;Impaired balance (sitting and/or standing);Decreased knowledge of use of DME or AE      OT Treatment/Interventions: Self-care/ADL training;Therapeutic exercise;Therapeutic activities;Energy conservation;DME and/or AE instruction;Patient/family education;Balance training    OT Goals(Current goals can be found  in the care plan section) Acute Rehab OT Goals Patient Stated Goal: to go home OT Goal Formulation: With patient Time For Goal Achievement: 02/15/23 Potential to Achieve Goals: Fair ADL Goals Pt Will Perform Grooming: with modified independence;standing Pt Will Perform Lower Body Dressing: with modified independence;sit to/from stand Pt Will Transfer to Toilet: with modified independence;ambulating Pt Will Perform Toileting - Clothing Manipulation and hygiene: with modified independence;sit to/from stand  OT Frequency: Min 1X/week       AM-PAC OT "6 Clicks" Daily Activity     Outcome Measure Help from another person eating meals?: None Help from another person taking care of personal grooming?: None Help from another person toileting, which includes using toliet, bedpan, or urinal?: A Little Help from another person bathing (including washing, rinsing, drying)?: A Little Help from another person to put on and taking off regular upper body clothing?: None Help from another person to put on and taking off regular lower body clothing?: A Little 6 Click Score: 21   End of Session Nurse Communication: Mobility status  Activity Tolerance: Patient tolerated treatment well Patient left: in bed;with call bell/phone within reach;with chair alarm set  OT Visit Diagnosis: Unsteadiness on feet (R26.81);Repeated falls (R29.6);Muscle weakness (generalized) (M62.81);History of falling (Z91.81)                Time: 1610-9604 OT Time  Calculation (min): 20 min Charges:  OT General Charges $OT Visit: 1 Visit OT Evaluation $OT Eval Low Complexity: 1 Low OT Treatments $Self Care/Home Management : 8-22 mins  Jackquline Denmark, MS, OTR/L , CBIS ascom [phone]  02/01/23, 10:30 AM

## 2023-02-01 NOTE — ED Notes (Signed)
Pt is A/O x 4, Pt states he takes oxy every 4 hours and that he takes it all the time. Asked pt if he took them yesterday and pt stated yes.

## 2023-02-01 NOTE — ED Notes (Signed)
Pt assisted with meal tray, pancakes cut, cream and sugar added to coffee per request, pt denies any other needs at this time. NAD noted. Call bell in reach. Fall alarm activated and working at this time.

## 2023-02-01 NOTE — ED Provider Notes (Signed)
12:00 AM  Assumed care at shift change.  Patient with chronic pain on oxycodone.  Had a fall tonight.  Family feels like he took too much oxycodone.  He is intoxicated in appearance with slurred speech.  Workup unremarkable.  Will need to be monitored until clinically sober.  2:43 AM  Pt has been monitored for over 6 hours and still has slurred speech and is extremely unsteady.  He seems altered and MS intoxicated.  His drug screen is negative for opiates but is positive for benzodiazepines which are not prescribed to him.  His girlfriend has told us privately that she thinks that he is stealing her Xanax.  When confronted about this patient becomes angry and will not admit to anything.  He is lab work, imaging and urine are otherwise unremarkable.  I feel he will need observation admission for encephalopathy likely secondary to polypharmacy.  Initially patient was resistant to this but now agrees.  Girlfriend at bedside also agrees.  Will discuss with hospitalist.    Consulted and discussed patient's case with hospitalist, Dr. Para March.  I have recommended admission and consulting physician agrees and will place admission orders.  Patient (and family if present) agree with this plan.   I reviewed all nursing notes, vitals, pertinent previous records.  All labs, EKGs, imaging ordered have been independently reviewed and interpreted by myself.    Stanely Sexson, Layla Maw, DO 02/01/23 [phone]
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
## 2023-02-01 NOTE — Assessment & Plan Note (Addendum)
Polypharmacy CK1 64. Fall precautions For counseling on responsible use of prescribed medications

## 2023-02-01 NOTE — Assessment & Plan Note (Signed)
Continue multimodal pain relief with Cymbalta, gabapentin and Zanaflex, oxycodone

## 2023-02-01 NOTE — ED Notes (Addendum)
Patient unable to follow commands and is extremely confused.  Unable to do orthostatic vital signs at this time d/t patient safety concerns.  Patient also refuses to leave monitoring equipment on and gets agitated when when it's on.  Monitoring equipment off for now.  Patient awake, sitting up in bed, watching TV.

## 2023-02-01 NOTE — ED Notes (Signed)
This RN spoke to [facility] per pt request and a Boyd Kerbs will be the person to contact for D/C reasons.

## 2023-02-01 NOTE — Assessment & Plan Note (Signed)
Continue [place].  

## 2023-02-01 NOTE — ED Notes (Signed)
Admitting MD at bedside.

## 2023-02-01 NOTE — ED Notes (Signed)
EDP spoke with patient and girlfriend about patients POC. Attempted to ambulate patient but patient struggled to sit up then as discussion of wanting to admit to hospital patient stood up by self with a unsteady gait.

## 2023-02-01 NOTE — Assessment & Plan Note (Signed)
Polypharmacy Patient with somnolence and drowsiness, UDS positive for Xanax Similar presentation in 2022 when patient had the additional complication of aspiration pneumonia and rhabdo related to fall Neurologic checks with fall and aspiration precautions
